# Patient Record
Sex: Male | Born: 1965
Health system: Southern US, Community
[De-identification: ages and names within clinical notes are randomized; demographics above are authoritative.]

## PROBLEM LIST (undated history)

## (undated) DIAGNOSIS — I10 Essential (primary) hypertension: Secondary | ICD-10-CM

## (undated) DIAGNOSIS — N400 Enlarged prostate without lower urinary tract symptoms: Secondary | ICD-10-CM

## (undated) DIAGNOSIS — F329 Major depressive disorder, single episode, unspecified: Secondary | ICD-10-CM

## (undated) DIAGNOSIS — E785 Hyperlipidemia, unspecified: Secondary | ICD-10-CM

## (undated) DIAGNOSIS — F32A Depression, unspecified: Secondary | ICD-10-CM

## (undated) HISTORY — DX: Major depressive disorder, single episode, unspecified: F32.9

## (undated) HISTORY — PX: NO PAST SURGERIES: SHX2092

## (undated) HISTORY — DX: Depression, unspecified: F32.A

## (undated) HISTORY — PX: CYST REMOVAL NECK: SHX6281

## (undated) HISTORY — PX: COSMETIC SURGERY: SHX468

---

## 1998-04-02 ENCOUNTER — Ambulatory Visit (HOSPITAL_COMMUNITY): Admission: RE | Admit: 1998-04-02 | Discharge: 1998-04-02 | Payer: Self-pay | Admitting: Internal Medicine

## 1999-04-15 ENCOUNTER — Ambulatory Visit (HOSPITAL_COMMUNITY): Admission: RE | Admit: 1999-04-15 | Discharge: 1999-04-15 | Payer: Self-pay | Admitting: Internal Medicine

## 1999-04-15 ENCOUNTER — Encounter: Payer: Self-pay | Admitting: Internal Medicine

## 2000-07-06 ENCOUNTER — Ambulatory Visit (HOSPITAL_COMMUNITY): Admission: RE | Admit: 2000-07-06 | Discharge: 2000-07-06 | Payer: Self-pay | Admitting: Internal Medicine

## 2000-07-06 ENCOUNTER — Encounter: Payer: Self-pay | Admitting: Internal Medicine

## 2000-09-21 ENCOUNTER — Emergency Department (HOSPITAL_COMMUNITY): Admission: EM | Admit: 2000-09-21 | Discharge: 2000-09-22 | Payer: Self-pay | Admitting: Emergency Medicine

## 2001-06-04 ENCOUNTER — Ambulatory Visit (HOSPITAL_BASED_OUTPATIENT_CLINIC_OR_DEPARTMENT_OTHER): Admission: RE | Admit: 2001-06-04 | Discharge: 2001-06-04 | Payer: Self-pay | Admitting: Surgery

## 2001-06-04 ENCOUNTER — Encounter (INDEPENDENT_AMBULATORY_CARE_PROVIDER_SITE_OTHER): Payer: Self-pay | Admitting: *Deleted

## 2001-09-19 ENCOUNTER — Ambulatory Visit (HOSPITAL_COMMUNITY): Admission: RE | Admit: 2001-09-19 | Discharge: 2001-09-19 | Payer: Self-pay | Admitting: Internal Medicine

## 2001-09-19 ENCOUNTER — Encounter: Payer: Self-pay | Admitting: Internal Medicine

## 2001-12-17 ENCOUNTER — Emergency Department (HOSPITAL_COMMUNITY): Admission: EM | Admit: 2001-12-17 | Discharge: 2001-12-17 | Payer: Self-pay | Admitting: Emergency Medicine

## 2005-12-09 ENCOUNTER — Ambulatory Visit (HOSPITAL_COMMUNITY): Admission: RE | Admit: 2005-12-09 | Discharge: 2005-12-09 | Payer: Self-pay | Admitting: Internal Medicine

## 2008-02-02 ENCOUNTER — Emergency Department (HOSPITAL_COMMUNITY): Admission: EM | Admit: 2008-02-02 | Discharge: 2008-02-02 | Payer: Self-pay | Admitting: Emergency Medicine

## 2009-09-24 ENCOUNTER — Emergency Department (HOSPITAL_COMMUNITY): Admission: EM | Admit: 2009-09-24 | Discharge: 2009-09-24 | Payer: Self-pay | Admitting: Emergency Medicine

## 2009-10-13 ENCOUNTER — Encounter: Admission: RE | Admit: 2009-10-13 | Discharge: 2009-10-13 | Payer: Self-pay | Admitting: Internal Medicine

## 2011-01-18 LAB — GLUCOSE, CAPILLARY: Glucose-Capillary: 109 — ABNORMAL HIGH

## 2011-12-27 ENCOUNTER — Other Ambulatory Visit: Payer: Self-pay | Admitting: Occupational Medicine

## 2011-12-27 ENCOUNTER — Ambulatory Visit (HOSPITAL_BASED_OUTPATIENT_CLINIC_OR_DEPARTMENT_OTHER)
Admission: RE | Admit: 2011-12-27 | Discharge: 2011-12-27 | Disposition: A | Discharge status: Home / Self Care | Payer: Managed Care, Other (non HMO) | Source: Ambulatory Visit | Attending: Occupational Medicine | Admitting: Occupational Medicine

## 2011-12-27 DIAGNOSIS — Z0000 Encounter for general adult medical examination without abnormal findings: Secondary | ICD-10-CM

## 2012-02-20 ENCOUNTER — Ambulatory Visit: Payer: Self-pay | Admitting: Cardiovascular Disease

## 2012-02-23 ENCOUNTER — Other Ambulatory Visit: Payer: Self-pay | Admitting: Urology

## 2012-04-05 ENCOUNTER — Encounter (HOSPITAL_BASED_OUTPATIENT_CLINIC_OR_DEPARTMENT_OTHER): Payer: Self-pay | Admitting: *Deleted

## 2012-04-05 NOTE — Progress Notes (Signed)
Pt instructed npo p mn x atenolol, pravastatin w sip of water.  To [facility] 12/20 @ 0615.  Needs istat on arrival.  Ekg, current list of meds, stress test reults requested from Dr. Kathryne Sharper office [phone])

## 2012-04-05 NOTE — Anesthesia Preprocedure Evaluation (Addendum)
Anesthesia Evaluation  Patient identified by MRN, date of birth, ID band Patient awake    Reviewed: Allergy & Precautions, H&P , NPO status , Patient's Chart, lab work & pertinent test results, reviewed documented beta blocker date and time   Airway Mallampati: II TM Distance: >3 FB Neck ROM: full    Dental No notable dental hx.    Pulmonary neg pulmonary ROS,  breath sounds clear to auscultation  Pulmonary exam normal       Cardiovascular Exercise Tolerance: Good hypertension, Pt. on home beta blockers and Pt. on medications negative cardio ROS  Rhythm:regular Rate:Normal     Neuro/Psych negative neurological ROS  negative psych ROS   GI/Hepatic negative GI ROS, Neg liver ROS,   Endo/Other  negative endocrine ROS  Renal/GU negative Renal ROS  negative genitourinary   Musculoskeletal negative musculoskeletal ROS (+)   Abdominal (+) + obese,   Peds negative pediatric ROS (+)  Hematology negative hematology ROS (+)   Anesthesia Other Findings   Reproductive/Obstetrics negative OB ROS                          Anesthesia Physical Anesthesia Plan  ASA: II  Anesthesia Plan: General   Post-op Pain Management:    Induction: Intravenous  Airway Management Planned: LMA  Additional Equipment:   Intra-op Plan:   Post-operative Plan: Extubation in OR  Informed Consent: I have reviewed the patients History and Physical, chart, labs and discussed the procedure including the risks, benefits and alternatives for the proposed anesthesia with the patient or authorized representative who has indicated his/her understanding and acceptance.   Dental advisory given  Plan Discussed with: CRNA  Anesthesia Plan Comments:         Anesthesia Quick Evaluation

## 2012-04-06 ENCOUNTER — Encounter (HOSPITAL_BASED_OUTPATIENT_CLINIC_OR_DEPARTMENT_OTHER): Payer: Self-pay | Admitting: *Deleted

## 2012-04-06 ENCOUNTER — Encounter (HOSPITAL_BASED_OUTPATIENT_CLINIC_OR_DEPARTMENT_OTHER): Payer: Self-pay | Admitting: Anesthesiology

## 2012-04-06 ENCOUNTER — Ambulatory Visit (HOSPITAL_BASED_OUTPATIENT_CLINIC_OR_DEPARTMENT_OTHER)
Admission: RE | Admit: 2012-04-06 | Discharge: 2012-04-06 | Disposition: A | Discharge status: Home / Self Care | Payer: Managed Care, Other (non HMO) | Source: Ambulatory Visit | Attending: Urology | Admitting: Urology

## 2012-04-06 ENCOUNTER — Encounter (HOSPITAL_BASED_OUTPATIENT_CLINIC_OR_DEPARTMENT_OTHER)
Admission: RE | Disposition: A | Discharge status: Home / Self Care | Payer: Self-pay | Source: Ambulatory Visit | Attending: Urology

## 2012-04-06 ENCOUNTER — Ambulatory Visit (HOSPITAL_BASED_OUTPATIENT_CLINIC_OR_DEPARTMENT_OTHER): Payer: Managed Care, Other (non HMO) | Admitting: Anesthesiology

## 2012-04-06 ENCOUNTER — Other Ambulatory Visit: Payer: Self-pay

## 2012-04-06 DIAGNOSIS — I10 Essential (primary) hypertension: Secondary | ICD-10-CM | POA: Insufficient documentation

## 2012-04-06 DIAGNOSIS — Z302 Encounter for sterilization: Secondary | ICD-10-CM | POA: Insufficient documentation

## 2012-04-06 DIAGNOSIS — E785 Hyperlipidemia, unspecified: Secondary | ICD-10-CM | POA: Insufficient documentation

## 2012-04-06 HISTORY — PX: VASECTOMY: SHX75

## 2012-04-06 HISTORY — DX: Hyperlipidemia, unspecified: E78.5

## 2012-04-06 HISTORY — DX: Essential (primary) hypertension: I10

## 2012-04-06 LAB — POCT I-STAT 4, (NA,K, GLUC, HGB,HCT)
Glucose, Bld: 118 mg/dL — ABNORMAL HIGH (ref 70–99)
HCT: 43 % (ref 39.0–52.0)
Hemoglobin: 14.6 g/dL (ref 13.0–17.0)
Potassium: 4.2 mEq/L (ref 3.5–5.1)
Sodium: 141 mEq/L (ref 135–145)

## 2012-04-06 SURGERY — VASECTOMY
Anesthesia: General | Site: Scrotum | Laterality: Bilateral | Wound class: Clean

## 2012-04-06 MED ORDER — EPHEDRINE SULFATE 50 MG/ML IJ SOLN
INTRAMUSCULAR | Status: DC | PRN
  Administered 2012-04-06: 10 mg via INTRAVENOUS

## 2012-04-06 MED ORDER — 0.9 % SODIUM CHLORIDE (POUR BTL) OPTIME
TOPICAL | Status: DC | PRN
  Administered 2012-04-06: 500 mL

## 2012-04-06 MED ORDER — ONDANSETRON HCL 4 MG/2ML IJ SOLN
INTRAMUSCULAR | Status: DC | PRN
  Administered 2012-04-06: 4 mg via INTRAVENOUS

## 2012-04-06 MED ORDER — PROMETHAZINE HCL 25 MG/ML IJ SOLN
6.2500 mg | INTRAMUSCULAR | Status: DC | PRN
  Filled 2012-04-06: qty 1

## 2012-04-06 MED ORDER — MIDAZOLAM HCL 5 MG/5ML IJ SOLN
INTRAMUSCULAR | Status: DC | PRN
  Administered 2012-04-06 (×2): 1 mg via INTRAVENOUS

## 2012-04-06 MED ORDER — LACTATED RINGERS IV SOLN
INTRAVENOUS | Status: DC
  Administered 2012-04-06 (×2): via INTRAVENOUS
  Filled 2012-04-06: qty 1000

## 2012-04-06 MED ORDER — SENNA-DOCUSATE SODIUM 8.6-50 MG PO TABS: 1.0000 | ORAL_TABLET | Freq: Every day | ORAL | Status: DC

## 2012-04-06 MED ORDER — CEFAZOLIN SODIUM-DEXTROSE 2-3 GM-% IV SOLR
2.0000 g | INTRAVENOUS | Status: DC
  Filled 2012-04-06: qty 50

## 2012-04-06 MED ORDER — FENTANYL CITRATE 0.05 MG/ML IJ SOLN
INTRAMUSCULAR | Status: DC | PRN
  Administered 2012-04-06 (×2): 50 ug via INTRAVENOUS

## 2012-04-06 MED ORDER — LIDOCAINE HCL (CARDIAC) 20 MG/ML IV SOLN
INTRAVENOUS | Status: DC | PRN
  Administered 2012-04-06: 75 mg via INTRAVENOUS

## 2012-04-06 MED ORDER — BUPIVACAINE HCL (PF) 0.25 % IJ SOLN
INTRAMUSCULAR | Status: DC | PRN
  Administered 2012-04-06: 3 mL

## 2012-04-06 MED ORDER — PROPOFOL 10 MG/ML IV BOLUS
INTRAVENOUS | Status: DC | PRN
  Administered 2012-04-06: 280 mg via INTRAVENOUS

## 2012-04-06 MED ORDER — DEXAMETHASONE SODIUM PHOSPHATE 4 MG/ML IJ SOLN
INTRAMUSCULAR | Status: DC | PRN
  Administered 2012-04-06: 10 mg via INTRAVENOUS

## 2012-04-06 MED ORDER — TRAMADOL HCL 50 MG PO TABS: 50.0000 mg | ORAL_TABLET | Freq: Four times a day (QID) | ORAL | Status: DC | PRN

## 2012-04-06 MED ORDER — DEXTROSE 5 % IV SOLN
3.0000 g | INTRAVENOUS | Status: DC | PRN
  Administered 2012-04-06: 3 g via INTRAVENOUS

## 2012-04-06 MED ORDER — FENTANYL CITRATE 0.05 MG/ML IJ SOLN
25.0000 ug | INTRAMUSCULAR | Status: DC | PRN
  Filled 2012-04-06: qty 1

## 2012-04-06 SURGICAL SUPPLY — 26 items
BANDAGE GAUZE ELAST BULKY 4 IN (GAUZE/BANDAGES/DRESSINGS) ×2 IMPLANT
BLADE SURG 15 STRL LF DISP TIS (BLADE) ×1 IMPLANT
BLADE SURG 15 STRL SS (BLADE) ×1
BLADE SURG ROTATE 9660 (MISCELLANEOUS) ×2 IMPLANT
CLOTH BEACON ORANGE TIMEOUT ST (SAFETY) ×2 IMPLANT
COVER MAYO STAND STRL (DRAPES) ×2 IMPLANT
COVER TABLE BACK 60X90 (DRAPES) ×2 IMPLANT
DRAPE PED LAPAROTOMY (DRAPES) ×2 IMPLANT
ELECT NEEDLE TIP 2.8 STRL (NEEDLE) IMPLANT
ELECT REM PT RETURN 9FT ADLT (ELECTROSURGICAL) ×2
ELECTRODE REM PT RTRN 9FT ADLT (ELECTROSURGICAL) ×1 IMPLANT
GLOVE BIO SURGEON STRL SZ7.5 (GLOVE) ×2 IMPLANT
GOWN W/2 COTTON TOWELS 2 STD (GOWNS) ×2 IMPLANT
NEEDLE HYPO 25X1 1.5 SAFETY (NEEDLE) ×2 IMPLANT
NEEDLE HYPO 25X5/8 SAFETYGLIDE (NEEDLE) IMPLANT
NS IRRIG 500ML POUR BTL (IV SOLUTION) ×2 IMPLANT
PACK BASIN DAY SURGERY FS (CUSTOM PROCEDURE TRAY) ×2 IMPLANT
PENCIL BUTTON HOLSTER BLD 10FT (ELECTRODE) ×2 IMPLANT
SUPPORT SCROTAL LG STRP (MISCELLANEOUS) ×2 IMPLANT
SUT CHROMIC 3 0 PS 2 (SUTURE) ×2 IMPLANT
SUT SILK 2 0 (SUTURE) ×1
SUT SILK 2-0 18XBRD TIE 12 (SUTURE) ×1 IMPLANT
SYR CONTROL 10ML LL (SYRINGE) ×2 IMPLANT
TOWEL OR 17X24 6PK STRL BLUE (TOWEL DISPOSABLE) ×2 IMPLANT
TRAY DSU PREP LF (CUSTOM PROCEDURE TRAY) ×2 IMPLANT
WATER STERILE IRR 500ML POUR (IV SOLUTION) IMPLANT

## 2012-04-06 NOTE — Brief Op Note (Signed)
04/06/2012  8:01 AM  PATIENT:  Nicholas Crosby  46 y.o. male  PRE-OPERATIVE DIAGNOSIS:  DESIRE FOR STERILIZATION  POST-OPERATIVE DIAGNOSIS:  desire sterilization  PROCEDURE:  Procedure(s) (LRB) with comments: VASECTOMY (Bilateral)  SURGEON:  Surgeon(s) and Role:    * Sebastian Ache, MD - Primary  PHYSICIAN ASSISTANT:   ASSISTANTS: none   ANESTHESIA:   local and general  EBL:     BLOOD ADMINISTERED:none  DRAINS: none   LOCAL MEDICATIONS USED:  MARCAINE     SPECIMEN:  Source of Specimen:  Bilateral vas segments  DISPOSITION OF SPECIMEN:  inspected and discarded  COUNTS:  YES  TOURNIQUET:  * No tourniquets in log *  DICTATION: .Other Dictation: Dictation Number [id number]  PLAN OF CARE: Discharge to home after PACU  PATIENT DISPOSITION:  PACU - hemodynamically stable.   Delay start of Pharmacological VTE agent (>24hrs) due to surgical blood loss or risk of bleeding: no

## 2012-04-06 NOTE — H&P (Signed)
Nicholas Crosby is an 46 y.o. male.   Chief Complaint: Pre-Op Vasectomy HPI:    1- Desire for Male Sterilization - PT is a 46yo man with two  healthy children ages 73 and 78.Marland Kitchen He is divorced and not in a relatio nship but has been considering vasectomy for some time.   On exam he has very unfavorable anatomy for office vasectomy. He is very stocky with "high-riding" testicles that makes manipulation of vas very difficult.   PMH sig for hypogonadism, obesity, HLD, HTN. No CV disease. No blood thinners.  Today Nicholas Crosby presents for operative vasectomy   Past Medical History  Diagnosis Date  . Hypertension   . Hyperlipidemia     Past Surgical History  Procedure Date  . No past surgeries     History reviewed. No pertinent family history. Social History:  reports that he has never smoked. He does not have any smokeless tobacco history on file. He reports that he drinks about 1.2 ounces of alcohol per week. His drug history not on file.  Allergies: No Known Allergies  No prescriptions prior to admission    No results found for this or any previous visit (from the past 48 hour(s)). No results found.  Review of Systems  Constitutional: Negative.   HENT: Negative.   Eyes: Negative.   Respiratory: Negative.   Cardiovascular: Negative.   Gastrointestinal: Negative.   Genitourinary: Negative.   Musculoskeletal: Negative.   Skin: Negative.   Neurological: Negative.   Endo/Heme/Allergies: Negative.   Psychiatric/Behavioral: Negative.     Height 5\' 11"  (1.803 m), weight 124.739 kg (275 lb). Physical Exam  Constitutional: He is oriented to person, place, and time. He appears well-developed and well-nourished.       Very stocky  HENT:  Head: Normocephalic and atraumatic.  Eyes: EOM are normal. Pupils are equal, round, and reactive to light.  Neck: Normal range of motion. Neck supple.  Cardiovascular: Normal rate.   Respiratory: Effort normal.  GI: Soft. Bowel sounds are  normal.  Genitourinary: Penis normal.       Testicles very high in scrotum, vas barely palpable  Musculoskeletal: Normal range of motion.  Neurological: He is alert and oriented to person, place, and time.  Skin: Skin is warm and dry.  Psychiatric: He has a normal mood and affect. His behavior is normal. Judgment and thought content normal.     Assessment/Plan  1- Desire for Male Sterilization - We re-discussed the risks (pain, hematoma) and benefits (permanent sterility) of vasectomy. I reinforced that although vas reversals are possible, he should consider the procedure permanent. I outlined the procedure and peri-procedural course with need to bring a driver and have limited activity x2-3 days afterwards.  I also reinforced that he should not engage in intercourse without other contraceptives until specifically cleared by me after two negative semenanalysis. I did outline alternative methods of contraception (condom, OCP, injections, tubal ligation). The patient voiced understanding and desired to proceed with vasectomy.   In his case I have couseled him towards procedure in OR as his anatomy is NOT FAVORABLE for office procedure due to his stocky habitus, short scrotum and prominant pre-pubic fat pad. He wants to proceed.  Nicholas Crosby, Nicholas Crosby 04/06/2012, 6:04 AM

## 2012-04-06 NOTE — Op Note (Signed)
NAME:  Nicholas Crosby, Nicholas Crosby               ACCOUNT NO.:  [id number]  MEDICAL RECORD NO.:  [id number]  LOCATION:  XRAY                          FACILITY:  [facility]  PHYSICIAN:  [name] [name], MD     DATE OF BIRTH:  02/23/1966  DATE OF PROCEDURE: DATE OF DISCHARGE:  12/27/2011                              OPERATIVE REPORT   DIAGNOSIS:  Desire for male sterilization.  PROCEDURE:  Bilateral vasectomy.  ASSESSMENT:  Bilateral vas segments inspected and discarded.  COMPLICATIONS:  None.  FINDINGS:  Very high-riding testicles, predominant prepubic fat pad.  COMPLICATIONS:  None.  INDICATION:  Nicholas Crosby is a pleasant 46-year-old gentleman, who has 2 children and desires no future children.  He has thought about this for greater than 6 months.  He was evaluated for vasectomy.  Office exam revealed that he had a very scant amount of relative scrotal tissue and high-riding testicles.  He is very stocky gentleman with a very prominent prepubic fat pad and it is hoped that vasectomy in the OR would be a safer avenue versus an office procedure and he wished to proceed.  Informed consent was obtained and placed in medical record.  PROCEDURE IN DETAIL:  The patient being Nicholas Crosby, was verified. Procedure being bilateral vasectomies confirmed.  Procedure was carried out.  Time-out was performed. Intravenous antibiotics administered. General LMA anesthesia was introduced.  The patient was placed in supine position.  Sterile field was created by prepping the patient's penis, scrotum and proximal thigh using iodine x3.  Next, the left testicle was placed on gentle inferior traction and the left deferens was palpated and brought in direct apposition to the lateral skin.  Sharp forceps were used to pierce the skin and is spread for distance of 1 cm.  Vas deferens grasped with ring forceps and brought up through the incision. It was further isolated by spreading the vas sheath and dissected for total  distance of approximately 3 cm.  It was doubly clamped.  A 1 cm intervening segment was cut and set aside.  This was inspected and felt to be vas deferens and set aside for discard.  The lumen of the free ends were coagulated in each and was also tied using a 3-0 chromic x1. Hemostasis appeared excellent.  This was redelivered into left hemiscrotum.  A single U stitch was placed at the level of the skin.  A mirror image procedure was then performed on the right side performing right vasectomy in an exact same fashion again coagulating the free ends, inspecting the vas segment, tying the free ends, and placing a single stitch on the skin surface, 3 mL of Marcaine was delivered locally.  Procedure was then terminated.  The patient tolerated the procedure well.  There were no immediate periprocedural complications.  The patient was taken to postanesthesia care unit in stable condition.          ______________________________ [name] [name], MD     TM/MEDQ  D:  04/06/2012  T:  04/06/2012  Job:  [id number] 

## 2012-04-06 NOTE — Op Note (Deleted)
NAME:  Nicholas Crosby, Nicholas Crosby NO.:  [id number]  MEDICAL RECORD NO.:  [id number]  LOCATION:  XRAY                          FACILITY:  [facility]  PHYSICIAN:  Sebastian Ache, MD     DATE OF BIRTH:  1965/05/29  DATE OF PROCEDURE: DATE OF DISCHARGE:  12/27/2011                              OPERATIVE REPORT   DIAGNOSIS:  Desire for male sterilization.  PROCEDURE:  Bilateral vasectomy.  ASSESSMENT:  Bilateral vas segments inspected and discarded.  COMPLICATIONS:  None.  FINDINGS:  Very high-riding testicles, predominant prepubic fat pad.  COMPLICATIONS:  None.  INDICATION:  [name] is a pleasant 46 year old gentleman, who has 2 children and desires no future children.  He has thought about this for greater than 6 months.  He was evaluated for vasectomy.  Office exam revealed that he had a very scant amount of relative scrotal tissue and high-riding testicles.  He is very stocky gentleman with a very prominent prepubic fat pad and it is hoped that vasectomy in the OR would be a safer avenue versus an office procedure and he wished to proceed.  Informed consent was obtained and placed in medical record.  PROCEDURE IN DETAIL:  The patient being brandun pinn, was verified. Procedure being bilateral vasectomies confirmed.  Procedure was carried out.  Time-out was performed. Intravenous antibiotics administered. General LMA anesthesia was introduced.  The patient was placed in supine position.  Sterile field was created by prepping the patient's penis, scrotum and proximal thigh using iodine x3.  Next, the left testicle was placed on gentle inferior traction and the left deferens was palpated and brought in direct apposition to the lateral skin.  Sharp forceps were used to pierce the skin and is spread for distance of 1 cm.  Vas deferens grasped with ring forceps and brought up through the incision. It was further isolated by spreading the vas sheath and dissected for total  distance of approximately 3 cm.  It was doubly clamped.  A 1 cm intervening segment was cut and set aside.  This was inspected and felt to be vas deferens and set aside for discard.  The lumen of the free ends were coagulated in each and was also tied using a 3-0 chromic x1. Hemostasis appeared excellent.  This was redelivered into left hemiscrotum.  A single U stitch was placed at the level of the skin.  A mirror image procedure was then performed on the right side performing right vasectomy in an exact same fashion again coagulating the free ends, inspecting the vas segment, tying the free ends, and placing a single stitch on the skin surface, 3 mL of Marcaine was delivered locally.  Procedure was then terminated.  The patient tolerated the procedure well.  There were no immediate periprocedural complications.  The patient was taken to postanesthesia care unit in stable condition.          ______________________________ Sebastian Ache, MD     TM/MEDQ  D:  04/06/2012  T:  04/06/2012  Job:  [id number]

## 2012-04-06 NOTE — Discharge Instructions (Addendum)
1 -  Call MD or go to ER for fever >102, severe pain / nausea / vomiting not relieved by medications, or acute change in medical status  2 - Continue scrotal support / snug undergarments x 48 hours along with minimal strenuous activity.  Doesn't need to stay to void before discharge

## 2012-04-06 NOTE — Anesthesia Procedure Notes (Signed)
Procedure Name: LMA Insertion Date/Time: 04/06/2012 7:35 AM Performed by: Fran Lowes Pre-anesthesia Checklist: Patient identified, Emergency Drugs available, Suction available and Patient being monitored Patient Re-evaluated:Patient Re-evaluated prior to inductionOxygen Delivery Method: Circle System Utilized Preoxygenation: Pre-oxygenation with 100% oxygen Intubation Type: IV induction Ventilation: Mask ventilation without difficulty LMA: LMA inserted LMA Size: 4.0 Number of attempts: 1 Airway Equipment and Method: bite block Placement Confirmation: positive ETCO2 Tube secured with: Tape Dental Injury: Teeth and Oropharynx as per pre-operative assessment

## 2012-04-06 NOTE — Anesthesia Postprocedure Evaluation (Signed)
  Anesthesia Post-op Note  Patient: Nicholas Crosby  Procedure(s) Performed: Procedure(s) (LRB): VASECTOMY (Bilateral)  Patient Location: PACU  Anesthesia Type: General  Level of Consciousness: awake and alert   Airway and Oxygen Therapy: Patient Spontanous Breathing  Post-op Pain: mild  Post-op Assessment: Post-op Vital signs reviewed, Patient's Cardiovascular Status Stable, Respiratory Function Stable, Patent Airway and No signs of Nausea or vomiting  Last Vitals:  Filed Vitals:   04/06/12 0845  BP:   Pulse: 59  Temp:   Resp: 20    Post-op Vital Signs: stable   Complications: No apparent anesthesia complications

## 2012-04-06 NOTE — Transfer of Care (Signed)
Immediate Anesthesia Transfer of Care Note  Patient: Nicholas Crosby  Procedure(s) Performed: Procedure(s) (LRB): VASECTOMY (Bilateral)  Patient Location: Patient transported to PACU with oxygen via face mask at 4 Liters / Min  Anesthesia Type: General  Level of Consciousness: awake and alert   Airway & Oxygen Therapy: Patient Spontanous Breathing and Patient connected to face mask oxygen  Post-op Assessment: Report given to PACU RN and Post -op Vital signs reviewed and stable  Post vital signs: Reviewed and stable  Dentition: Teeth and oropharynx remain in pre-op condition  Complications: No apparent anesthesia complications

## 2012-04-09 ENCOUNTER — Encounter (HOSPITAL_BASED_OUTPATIENT_CLINIC_OR_DEPARTMENT_OTHER): Payer: Self-pay | Admitting: Urology

## 2012-05-24 ENCOUNTER — Emergency Department (HOSPITAL_BASED_OUTPATIENT_CLINIC_OR_DEPARTMENT_OTHER)
Admission: EM | Admit: 2012-05-24 | Discharge: 2012-05-24 | Disposition: A | Discharge status: Home / Self Care | Payer: Managed Care, Other (non HMO) | Attending: Emergency Medicine | Admitting: Emergency Medicine

## 2012-05-24 ENCOUNTER — Encounter (HOSPITAL_BASED_OUTPATIENT_CLINIC_OR_DEPARTMENT_OTHER): Payer: Self-pay | Admitting: Emergency Medicine

## 2012-05-24 DIAGNOSIS — I10 Essential (primary) hypertension: Secondary | ICD-10-CM | POA: Insufficient documentation

## 2012-05-24 DIAGNOSIS — Z79899 Other long term (current) drug therapy: Secondary | ICD-10-CM | POA: Insufficient documentation

## 2012-05-24 DIAGNOSIS — R6883 Chills (without fever): Secondary | ICD-10-CM | POA: Insufficient documentation

## 2012-05-24 DIAGNOSIS — R197 Diarrhea, unspecified: Secondary | ICD-10-CM | POA: Insufficient documentation

## 2012-05-24 DIAGNOSIS — E785 Hyperlipidemia, unspecified: Secondary | ICD-10-CM | POA: Insufficient documentation

## 2012-05-24 DIAGNOSIS — R110 Nausea: Secondary | ICD-10-CM | POA: Insufficient documentation

## 2012-05-24 MED ORDER — METRONIDAZOLE 500 MG PO TABS: 500.0000 mg | ORAL_TABLET | Freq: Three times a day (TID) | ORAL | Status: DC

## 2012-05-24 MED ORDER — ONDANSETRON HCL 8 MG PO TABS: 8.0000 mg | ORAL_TABLET | ORAL | Status: DC | PRN

## 2012-05-24 NOTE — ED Notes (Signed)
Pt has not contacted PMD for this nor has he tried any OTC medications.

## 2012-05-24 NOTE — ED Notes (Signed)
Pt c/o diarrhea x 3 days. Pt states mild nausea that started this AM. Pt states his father is at [facility] with C diff.

## 2012-05-24 NOTE — ED Provider Notes (Signed)
History     CSN: [id number]  Arrival date & time 05/24/12  0526   First MD Initiated Contact with Patient 05/24/12 0536      Chief Complaint  Patient presents with  . Diarrhea  . Nausea    (Consider location/radiation/quality/duration/timing/severity/associated sxs/prior treatment) HPI Comments: Patient presents with a two day history of diarrhea.  His father was diagnosed with c-diff and he has been around him quite a bit.  All stool non-bloody.  He denies fevers, chills, or abd pain.    Patient is a 47 y.o. male presenting with diarrhea. The history is provided by the patient.  Diarrhea The primary symptoms include nausea and diarrhea. Primary symptoms do not include vomiting. The illness began 2 days ago. The onset was gradual. The problem has been gradually worsening.  The illness is also significant for chills.    Past Medical History  Diagnosis Date  . Hypertension   . Hyperlipidemia     Past Surgical History  Procedure Date  . No past surgeries   . Vasectomy 04/06/2012    Procedure: VASECTOMY;  Surgeon: Sebastian Ache, MD;  Location: [facility];  Service: Urology;  Laterality: Bilateral;    No family history on file.  History  Substance Use Topics  . Smoking status: Never Smoker   . Smokeless tobacco: Not on file  . Alcohol Use: 1.2 oz/week    2 Cans of beer per week      Review of Systems  Constitutional: Positive for chills.  Gastrointestinal: Positive for nausea and diarrhea. Negative for vomiting.  All other systems reviewed and are negative.    Allergies  Review of patient's allergies indicates no known allergies.  Home Medications   Current Outpatient Rx  Name  Route  Sig  Dispense  Refill  . ATENOLOL 100 MG PO TABS   Oral   Take 50 mg by mouth daily.         . BUPROPION HCL ER (XL) 300 MG PO TB24   Oral   Take 300 mg by mouth daily.         . ENALAPRIL MALEATE 10 MG PO TABS   Oral   Take 10 mg by mouth daily.        Marland Kitchen HYDROCHLOROTHIAZIDE 25 MG PO TABS   Oral   Take 25 mg by mouth daily.         Marland Kitchen PRAVASTATIN SODIUM 20 MG PO TABS   Oral   Take 20 mg by mouth daily.         Marland Kitchen VERAPAMIL HCL 120 MG PO TABS   Oral   Take 120 mg by mouth 3 (three) times daily.         . SENNA-DOCUSATE SODIUM 8.6-50 MG PO TABS   Oral   Take 1 tablet by mouth daily. While taking pain meds to prevent constipation   30 tablet   0   . TRAMADOL HCL 50 MG PO TABS   Oral   Take 1 tablet (50 mg total) by mouth every 6 (six) hours as needed for pain.   30 tablet   0     BP 169/102  Pulse 89  Temp 98.3 F (36.8 C) (Oral)  Resp 18  Ht 5\' 11"  (1.803 m)  Wt 270 lb (122.471 kg)  BMI 37.66 kg/m2  SpO2 98%  Physical Exam  Nursing note and vitals reviewed. Constitutional: He is oriented to person, place, and time. He appears well-developed and well-nourished. No distress.  HENT:  Head: Normocephalic and atraumatic.  Mouth/Throat: Oropharynx is clear and moist.  Neck: Normal range of motion. Neck supple.  Cardiovascular: Normal rate and regular rhythm.   No murmur heard. Pulmonary/Chest: Effort normal and breath sounds normal. No respiratory distress.  Abdominal: Soft. Bowel sounds are normal. He exhibits no distension. There is no tenderness.  Musculoskeletal: Normal range of motion. He exhibits no edema.  Neurological: He is alert and oriented to person, place, and time.  Skin: Skin is warm and dry. He is not diaphoretic.    ED Course  Procedures (including critical care time)  Labs Reviewed - No data to display No results found.   No diagnosis found.    MDM  Symptoms sound viral in nature, however he has been exposed to someone with C-Diff.  Will recommend otc anti-diarrheals, time.  If not improving or worsens in the next 1-2 days, will give Rx for flagyl for presumed C-Diff.        Geoffery Lyons, MD 05/24/12 [phone]

## 2012-12-14 LAB — FECAL OCCULT BLOOD, GUAIAC: Fecal Occult Blood: NEGATIVE

## 2013-03-05 ENCOUNTER — Encounter: Payer: Self-pay | Admitting: Internal Medicine

## 2013-03-05 DIAGNOSIS — E559 Vitamin D deficiency, unspecified: Secondary | ICD-10-CM | POA: Insufficient documentation

## 2013-03-05 DIAGNOSIS — E349 Endocrine disorder, unspecified: Secondary | ICD-10-CM | POA: Insufficient documentation

## 2013-03-05 DIAGNOSIS — F32A Depression, unspecified: Secondary | ICD-10-CM

## 2013-03-05 DIAGNOSIS — E291 Testicular hypofunction: Secondary | ICD-10-CM

## 2013-03-05 DIAGNOSIS — E785 Hyperlipidemia, unspecified: Secondary | ICD-10-CM | POA: Insufficient documentation

## 2013-03-05 DIAGNOSIS — I10 Essential (primary) hypertension: Secondary | ICD-10-CM

## 2013-03-05 DIAGNOSIS — F329 Major depressive disorder, single episode, unspecified: Secondary | ICD-10-CM

## 2013-03-05 DIAGNOSIS — F325 Major depressive disorder, single episode, in full remission: Secondary | ICD-10-CM | POA: Insufficient documentation

## 2013-03-06 ENCOUNTER — Encounter: Payer: Self-pay | Admitting: Emergency Medicine

## 2013-03-06 ENCOUNTER — Ambulatory Visit: Payer: Managed Care, Other (non HMO) | Admitting: Emergency Medicine

## 2013-03-06 VITALS — BP 132/80 | HR 60 | Temp 98.0°F | Resp 18 | Wt 284.0 lb

## 2013-03-06 DIAGNOSIS — I10 Essential (primary) hypertension: Secondary | ICD-10-CM

## 2013-03-06 DIAGNOSIS — R7309 Other abnormal glucose: Secondary | ICD-10-CM

## 2013-03-06 DIAGNOSIS — E559 Vitamin D deficiency, unspecified: Secondary | ICD-10-CM

## 2013-03-06 DIAGNOSIS — E782 Mixed hyperlipidemia: Secondary | ICD-10-CM

## 2013-03-06 DIAGNOSIS — E291 Testicular hypofunction: Secondary | ICD-10-CM

## 2013-03-06 LAB — BASIC METABOLIC PANEL WITH GFR
BUN: 12 mg/dL (ref 6–23)
CO2: 30 mEq/L (ref 19–32)
Calcium: 9.4 mg/dL (ref 8.4–10.5)
Chloride: 103 mEq/L (ref 96–112)
Creat: 1 mg/dL (ref 0.50–1.35)
GFR, Est African American: >89 mL/min
GFR, Est Non African American: >89 mL/min
Glucose, Bld: 113 mg/dL — ABNORMAL HIGH (ref 70–99)
Potassium: 4.4 mEq/L (ref 3.5–5.3)
Sodium: 141 mEq/L (ref 135–145)

## 2013-03-06 LAB — CBC WITH DIFFERENTIAL/PLATELET
Basophils Absolute: 0 10*3/uL (ref 0.0–0.1)
Basophils Relative: 1 % (ref 0–1)
Eosinophils Absolute: 0.2 10*3/uL (ref 0.0–0.7)
Eosinophils Relative: 3 % (ref 0–5)
HCT: 39.5 % (ref 39.0–52.0)
Hemoglobin: 13.8 g/dL (ref 13.0–17.0)
Lymphocytes Relative: 28 % (ref 12–46)
Lymphs Abs: 2.1 10*3/uL (ref 0.7–4.0)
MCH: 28.6 pg (ref 26.0–34.0)
MCHC: 34.9 g/dL (ref 30.0–36.0)
MCV: 81.8 fL (ref 78.0–100.0)
Monocytes Absolute: 0.6 10*3/uL (ref 0.1–1.0)
Monocytes Relative: 9 % (ref 3–12)
Neutro Abs: 4.6 10*3/uL (ref 1.7–7.7)
Neutrophils Relative %: 59 % (ref 43–77)
Platelets: 268 10*3/uL (ref 150–400)
RBC: 4.83 MIL/uL (ref 4.22–5.81)
RDW: 14.7 % (ref 11.5–15.5)
WBC: 7.6 10*3/uL (ref 4.0–10.5)

## 2013-03-06 LAB — LIPID PANEL
Cholesterol: 202 mg/dL — ABNORMAL HIGH (ref 0–200)
HDL: 37 mg/dL — ABNORMAL LOW (ref >39)
LDL Cholesterol: 92 mg/dL (ref 0–99)
Total CHOL/HDL Ratio: 5.5 Ratio
Triglycerides: 363 mg/dL — ABNORMAL HIGH (ref <150)
VLDL: 73 mg/dL — ABNORMAL HIGH (ref 0–40)

## 2013-03-06 LAB — HEPATIC FUNCTION PANEL
ALT: 40 U/L (ref 0–53)
AST: 27 U/L (ref 0–37)
Albumin: 4.1 g/dL (ref 3.5–5.2)
Alkaline Phosphatase: 41 U/L (ref 39–117)
Bilirubin, Direct: 0.1 mg/dL (ref 0.0–0.3)
Indirect Bilirubin: 0.2 mg/dL (ref 0.0–0.9)
Total Bilirubin: 0.3 mg/dL (ref 0.3–1.2)
Total Protein: 6.8 g/dL (ref 6.0–8.3)

## 2013-03-06 LAB — TESTOSTERONE: Testosterone: 154 ng/dL — ABNORMAL LOW (ref 300–890)

## 2013-03-06 LAB — MAGNESIUM: Magnesium: 1.9 mg/dL (ref 1.5–2.5)

## 2013-03-06 LAB — HEMOGLOBIN A1C
Hgb A1c MFr Bld: 5.9 % — ABNORMAL HIGH (ref <5.7)
Mean Plasma Glucose: 123 mg/dL — ABNORMAL HIGH (ref <117)

## 2013-03-06 NOTE — Progress Notes (Signed)
  Subjective:    Patient ID: Nicholas Crosby, male    DOB: 12-02-1965, 47 y.o.   MRN: [id number]  HPI Comments: 47 yo male presents for 3 month F/U for HTN, Cholesterol, Pre-Dm, D. Deficient, hypogonadism. Added vit D 5000 and Androgel 1.62 % at last ov and has had increased energy level. Exercising more regularly, try to lose wt. Eating better most days. BP has  been good at home. LAST LABS T 178 TG 259 HDL 39 LDL 87 A1C 5.8   Hypertension  Hyperlipidemia    Current Outpatient Prescriptions on File Prior to Visit  Medication Sig Dispense Refill  . aspirin 81 MG tablet Take 81 mg by mouth daily.      Marland Kitchen atenolol (TENORMIN) 100 MG tablet Take 50 mg by mouth daily.      Marland Kitchen buPROPion (WELLBUTRIN XL) 300 MG 24 hr tablet Take 300 mg by mouth daily.      . enalapril (VASOTEC) 10 MG tablet Take 10 mg by mouth daily.      . hydrochlorothiazide (HYDRODIURIL) 25 MG tablet Take 25 mg by mouth daily.      . pravastatin (PRAVACHOL) 20 MG tablet Take 20 mg by mouth daily.      . Testosterone (ANDROGEL) 20.25 MG/1.25GM (1.62%) GEL Place 4 Squirts onto the skin daily.      . verapamil (CALAN) 120 MG tablet Take 120 mg by mouth 3 (three) times daily.       No current facility-administered medications on file prior to visit.   ALLERGIES Zocor and Crestor  Past Medical History  Diagnosis Date  . Hypertension   . Hyperlipidemia   . Other testicular hypofunction   . Unspecified vitamin D deficiency   . Depression      Review of Systems  All other systems reviewed and are negative.    BP 132/80  Pulse 60  Temp(Src) 98 F (36.7 C) (Temporal)  Resp 18  Wt 284 lb (128.822 kg)     Objective:   Physical Exam  Nursing note and vitals reviewed. Constitutional: He is oriented to person, place, and time. He appears well-developed and well-nourished.  HENT:  Head: Normocephalic and atraumatic.  Right Ear: External ear normal.  Left Ear: External ear normal.  Nose: Nose normal.  Mouth/Throat:  Oropharynx is clear and moist.  Eyes: Pupils are equal, round, and reactive to light.  Neck: Normal range of motion. Neck supple.  Cardiovascular: Normal rate, regular rhythm, normal heart sounds and intact distal pulses.   Pulmonary/Chest: Effort normal and breath sounds normal.  Abdominal: Soft. Bowel sounds are normal. He exhibits no distension and no mass. There is no tenderness.  Musculoskeletal: Normal range of motion.  Neurological: He is alert and oriented to person, place, and time.  Skin: Skin is warm and dry.  Psychiatric: He has a normal mood and affect. Judgment and thought content normal.          Assessment & Plan:  1. 3 month F/U for HTN, Cholesterol, Pre-Dm, D. Deficient, HYPOGONADISM recheck labs, continue to increase exercise, wt loss, and improve diet. 2. Allergic rhinitis add Allegra AD

## 2013-03-06 NOTE — Patient Instructions (Signed)
Diabetes Meal Planning Guide The diabetes meal planning guide is a tool to help you plan your meals and snacks. It is important for people with diabetes to manage their blood glucose (sugar) levels. Choosing the right foods and the right amounts throughout your day will help control your blood glucose. Eating right can even help you improve your blood pressure and reach or maintain a healthy weight. CARBOHYDRATE COUNTING MADE EASY When you eat carbohydrates, they turn to sugar. This raises your blood glucose level. Counting carbohydrates can help you control this level so you feel better. When you plan your meals by counting carbohydrates, you can have more flexibility in what you eat and balance your medicine with your food intake. Carbohydrate counting simply means adding up the total amount of carbohydrate grams in your meals and snacks. Try to eat about the same amount at each meal. Foods with carbohydrates are listed below. Each portion below is 1 carbohydrate serving or 15 grams of carbohydrates. Ask your dietician how many grams of carbohydrates you should eat at each meal or snack. Grains and Starches  1 slice bread.   English muffin or hotdog/hamburger bun.   cup cold cereal (unsweetened).   cup cooked pasta or rice.   cup starchy vegetables (corn, potatoes, peas, beans, winter squash).  1 tortilla (6 inches).   bagel.  1 waffle or pancake (size of a CD).   cup cooked cereal.  4 to 6 small crackers. *Whole grain is recommended. Fruit  1 cup fresh unsweetened berries, melon, papaya, pineapple.  1 small fresh fruit.   banana or mango.   cup fruit juice (4 oz unsweetened).   cup canned fruit in natural juice or water.  2 tbs dried fruit.  12 to 15 grapes or cherries. Milk and Yogurt  1 cup fat-free or 1% milk.  1 cup soy milk.  6 oz light yogurt with sugar-free sweetener.  6 oz low-fat soy yogurt.  6 oz plain yogurt. Vegetables  1 cup raw or  cup  cooked is counted as 0 carbohydrates or a "free" food.  If you eat 3 or more servings at 1 meal, count them as 1 carbohydrate serving. Other Carbohydrates   oz chips or pretzels.   cup ice cream or frozen yogurt.   cup sherbet or sorbet.  2 inch square cake, no frosting.  1 tbs honey, sugar, jam, jelly, or syrup.  2 small cookies.  3 squares of graham crackers.  3 cups popcorn.  6 crackers.  1 cup broth-based soup.  Count 1 cup casserole or other mixed foods as 2 carbohydrate servings.  Foods with less than 20 calories in a serving may be counted as 0 carbohydrates or a "free" food. You may want to purchase a book or computer software that lists the carbohydrate gram counts of different foods. In addition, the nutrition facts panel on the labels of the foods you eat are a good source of this information. The label will tell you how big the serving size is and the total number of carbohydrate grams you will be eating per serving. Divide this number by 15 to obtain the number of carbohydrate servings in a portion. Remember, 1 carbohydrate serving equals 15 grams of carbohydrate. SERVING SIZES Measuring foods and serving sizes helps you make sure you are getting the right amount of food. The list below tells how big or small some common serving sizes are.  1 oz.........4 stacked dice.  3 oz.........Deck of cards.  1 tsp........Tip   of little finger.  1 tbs........Thumb.  2 tbs........Golf ball.   cup.......Half of a fist.  1 cup........A fist. SAMPLE DIABETES MEAL PLAN Below is a sample meal plan that includes foods from the grain and starches, dairy, vegetable, fruit, and meat groups. A dietician can individualize a meal plan to fit your calorie needs and tell you the number of servings needed from each food group. However, controlling the total amount of carbohydrates in your meal or snack is more important than making sure you include all of the food groups at every  meal. You may interchange carbohydrate containing foods (dairy, starches, and fruits). The meal plan below is an example of a 2000 calorie diet using carbohydrate counting. This meal plan has 17 carbohydrate servings. Breakfast  1 cup oatmeal (2 carb servings).   cup light yogurt (1 carb serving).  1 cup blueberries (1 carb serving).   cup almonds. Snack  1 large apple (2 carb servings).  1 low-fat string cheese stick. Lunch  Chicken breast salad.  1 cup spinach.   cup chopped tomatoes.  2 oz chicken breast, sliced.  2 tbs low-fat Italian dressing.  12 whole-wheat crackers (2 carb servings).  12 to 15 grapes (1 carb serving).  1 cup low-fat milk (1 carb serving). Snack  1 cup carrots.   cup hummus (1 carb serving). Dinner  3 oz broiled salmon.  1 cup brown rice (3 carb servings). Snack  1  cups steamed broccoli (1 carb serving) drizzled with 1 tsp olive oil and lemon juice.  1 cup light pudding (2 carb servings). DIABETES MEAL PLANNING WORKSHEET Your dietician can use this worksheet to help you decide how many servings of foods and what types of foods are right for you.  BREAKFAST Food Group and Servings / Carb Servings Grain/Starches __________________________________ Dairy __________________________________________ Vegetable ______________________________________ Fruit ___________________________________________ Meat __________________________________________ Fat ____________________________________________ LUNCH Food Group and Servings / Carb Servings Grain/Starches ___________________________________ Dairy ___________________________________________ Fruit ____________________________________________ Meat ___________________________________________ Fat _____________________________________________ DINNER Food Group and Servings / Carb Servings Grain/Starches ___________________________________ Dairy  ___________________________________________ Fruit ____________________________________________ Meat ___________________________________________ Fat _____________________________________________ SNACKS Food Group and Servings / Carb Servings Grain/Starches ___________________________________ Dairy ___________________________________________ Vegetable _______________________________________ Fruit ____________________________________________ Meat ___________________________________________ Fat _____________________________________________ DAILY TOTALS Starches _________________________ Vegetable ________________________ Fruit ____________________________ Dairy ____________________________ Meat ____________________________ Fat ______________________________ Document Released: 12/30/2004 Document Revised: 06/27/2011 Document Reviewed: 11/10/2008 [employer] Patient Information 2014 [employer]. Fat and Cholesterol Control Diet Fat and cholesterol levels in your blood and organs are influenced by your diet. High levels of fat and cholesterol may lead to diseases of the heart, small and large blood vessels, gallbladder, liver, and pancreas. CONTROLLING FAT AND CHOLESTEROL WITH DIET Although exercise and lifestyle factors are important, your diet is key. That is because certain foods are known to raise cholesterol and others to lower it. The goal is to balance foods for their effect on cholesterol and more importantly, to replace saturated and trans fat with other types of fat, such as monounsaturated fat, polyunsaturated fat, and omega-3 fatty acids. On average, a person should consume no more than 15 to 17 g of saturated fat daily. Saturated and trans fats are considered "bad" fats, and they will raise LDL cholesterol. Saturated fats are primarily found in animal products such as meats, butter, and cream. However, that does not mean you need to give up all your favorite foods. Today, there are  good tasting, low-fat, low-cholesterol substitutes for most of the things you like to eat. Choose low-fat or nonfat alternatives. Choose round or   loin cuts of red meat. These types of cuts are lowest in fat and cholesterol. Chicken (without the skin), fish, veal, and ground turkey breast are great choices. Eliminate fatty meats, such as hot dogs and salami. Even shellfish have little or no saturated fat. Have a 3 oz (85 g) portion when you eat lean meat, poultry, or fish. Trans fats are also called "partially hydrogenated oils." They are oils that have been scientifically manipulated so that they are solid at room temperature resulting in a longer shelf life and improved taste and texture of foods in which they are added. Trans fats are found in stick margarine, some tub margarines, cookies, crackers, and baked goods.  When baking and cooking, oils are a great substitute for butter. The monounsaturated oils are especially beneficial since it is believed they lower LDL and raise HDL. The oils you should avoid entirely are saturated tropical oils, such as coconut and palm.  Remember to eat a lot from food groups that are naturally free of saturated and trans fat, including fish, fruit, vegetables, beans, grains (barley, rice, couscous, bulgur wheat), and pasta (without cream sauces).  IDENTIFYING FOODS THAT LOWER FAT AND CHOLESTEROL  Soluble fiber may lower your cholesterol. This type of fiber is found in fruits such as apples, vegetables such as broccoli, potatoes, and carrots, legumes such as beans, peas, and lentils, and grains such as barley. Foods fortified with plant sterols (phytosterol) may also lower cholesterol. You should eat at least 2 g per day of these foods for a cholesterol lowering effect.  Read package labels to identify low-saturated fats, trans fat free, and low-fat foods at the supermarket. Select cheeses that have only 2 to 3 g saturated fat per ounce. Use a heart-healthy tub margarine that  is free of trans fats or partially hydrogenated oil. When buying baked goods (cookies, crackers), avoid partially hydrogenated oils. Breads and muffins should be made from whole grains (whole-wheat or whole oat flour, instead of "flour" or "enriched flour"). Buy non-creamy canned soups with reduced salt and no added fats.  FOOD PREPARATION TECHNIQUES  Never deep-fry. If you must fry, either stir-fry, which uses very little fat, or use non-stick cooking sprays. When possible, broil, bake, or roast meats, and steam vegetables. Instead of putting butter or margarine on vegetables, use lemon and herbs, applesauce, and cinnamon (for squash and sweet potatoes). Use nonfat yogurt, salsa, and low-fat dressings for salads.  LOW-SATURATED FAT / LOW-FAT FOOD SUBSTITUTES Meats / Saturated Fat (g)  Avoid: Steak, marbled (3 oz/85 g) / 11 g  Choose: Steak, lean (3 oz/85 g) / 4 g  Avoid: Hamburger (3 oz/85 g) / 7 g  Choose: Hamburger, lean (3 oz/85 g) / 5 g  Avoid: Ham (3 oz/85 g) / 6 g  Choose: Ham, lean cut (3 oz/85 g) / 2.4 g  Avoid: Chicken, with skin, dark meat (3 oz/85 g) / 4 g  Choose: Chicken, skin removed, dark meat (3 oz/85 g) / 2 g  Avoid: Chicken, with skin, light meat (3 oz/85 g) / 2.5 g  Choose: Chicken, skin removed, light meat (3 oz/85 g) / 1 g Dairy / Saturated Fat (g)  Avoid: Whole milk (1 cup) / 5 g  Choose: Low-fat milk, 2% (1 cup) / 3 g  Choose: Low-fat milk, 1% (1 cup) / 1.5 g  Choose: Skim milk (1 cup) / 0.3 g  Avoid: Hard cheese (1 oz/28 g) / 6 g  Choose: Skim milk cheese (1 oz/28 g) /   2 to 3 g  Avoid: Cottage cheese, 4% fat (1 cup) / 6.5 g  Choose: Low-fat cottage cheese, 1% fat (1 cup) / 1.5 g  Avoid: Ice cream (1 cup) / 9 g  Choose: Sherbet (1 cup) / 2.5 g  Choose: Nonfat frozen yogurt (1 cup) / 0.3 g  Choose: Frozen fruit bar / trace  Avoid: Whipped cream (1 tbs) / 3.5 g  Choose: Nondairy whipped topping (1 tbs) / 1 g Condiments / Saturated Fat  (g)  Avoid: Mayonnaise (1 tbs) / 2 g  Choose: Low-fat mayonnaise (1 tbs) / 1 g  Avoid: Butter (1 tbs) / 7 g  Choose: Extra light margarine (1 tbs) / 1 g  Avoid: Coconut oil (1 tbs) / 11.8 g  Choose: Olive oil (1 tbs) / 1.8 g  Choose: Corn oil (1 tbs) / 1.7 g  Choose: Safflower oil (1 tbs) / 1.2 g  Choose: Sunflower oil (1 tbs) / 1.4 g  Choose: Soybean oil (1 tbs) / 2.4 g  Choose: Canola oil (1 tbs) / 1 g Document Released: 04/04/2005 Document Revised: 07/30/2012 Document Reviewed: 09/23/2010 [employer] Patient Information 2014 [employer].  

## 2013-03-07 LAB — VITAMIN D 25 HYDROXY (VIT D DEFICIENCY, FRACTURES): Vit D, 25-Hydroxy: 56 ng/mL (ref 30–89)

## 2013-03-07 LAB — INSULIN, FASTING: Insulin fasting, serum: 40 u[IU]/mL — ABNORMAL HIGH (ref 3–28)

## 2013-03-12 ENCOUNTER — Other Ambulatory Visit: Payer: Self-pay | Admitting: Internal Medicine

## 2013-03-12 MED ORDER — ATENOLOL 100 MG PO TABS: 50.0000 mg | ORAL_TABLET | Freq: Every day | ORAL | Status: DC

## 2013-03-27 ENCOUNTER — Ambulatory Visit (HOSPITAL_COMMUNITY)
Admission: RE | Admit: 2013-03-27 | Discharge: 2013-03-27 | Disposition: A | Discharge status: Home / Self Care | Payer: Managed Care, Other (non HMO) | Source: Ambulatory Visit | Attending: Emergency Medicine | Admitting: Emergency Medicine

## 2013-03-27 ENCOUNTER — Other Ambulatory Visit: Payer: Self-pay | Admitting: Emergency Medicine

## 2013-03-27 ENCOUNTER — Ambulatory Visit (INDEPENDENT_AMBULATORY_CARE_PROVIDER_SITE_OTHER): Payer: Managed Care, Other (non HMO) | Admitting: Emergency Medicine

## 2013-03-27 ENCOUNTER — Encounter: Payer: Self-pay | Admitting: Emergency Medicine

## 2013-03-27 VITALS — BP 122/80 | Temp 98.2°F | Resp 18 | Wt 280.0 lb

## 2013-03-27 DIAGNOSIS — M25562 Pain in left knee: Secondary | ICD-10-CM

## 2013-03-27 DIAGNOSIS — M25569 Pain in unspecified knee: Secondary | ICD-10-CM

## 2013-03-27 DIAGNOSIS — E291 Testicular hypofunction: Secondary | ICD-10-CM

## 2013-03-27 MED ORDER — TESTOSTERONE CYPIONATE 200 MG/ML IM SOLN: INTRAMUSCULAR | Status: DC

## 2013-03-27 NOTE — Patient Instructions (Signed)
Knee Pain Knee pain can be a result of an injury or other medical conditions. Treatment will depend on the cause of your pain. HOME CARE  Only take medicine as told by your doctor.  Keep a healthy weight. Being overweight can make the knee hurt more.  Stretch before exercising or playing sports.  If there is constant knee pain, change the way you exercise. Ask your doctor for advice.  Make sure shoes fit well. Choose the right shoe for the sport or activity.  Protect your knees. Wear kneepads if needed.  Rest when you are tired. GET HELP RIGHT AWAY IF:   Your knee pain does not stop.  Your knee pain does not get better.  Your knee joint feels hot to the touch.  You have a fever. MAKE SURE YOU:   Understand these instructions.  Will watch this condition.  Will get help right away if you are not doing well or get worse. Document Released: 07/01/2008 Document Revised: 06/27/2011 Document Reviewed: 07/01/2008 [employer] Patient Information 2014 [employer].  

## 2013-03-28 NOTE — Progress Notes (Signed)
   Subjective:    Patient ID: Nicholas Crosby, male    DOB: 01/23/1966, 48 y.o.   MRN: [id number]  HPI Comments: 47 YO overweight male restarted running on hard surface and has noticed left knee pain since. He has been exercising with the ellipitical without any problems. He notes pain is worse after rest, feels stiff and occasional gives out. He denies erythema or edema. He has taken OTC pain relief which have helped. No hx of previous trauma.  He would also like to d/c androgel due to risks with small children and restart injections. He has a trainer who will give shots.  Knee Pain      Current Outpatient Prescriptions on File Prior to Visit  Medication Sig Dispense Refill  . aspirin 81 MG tablet Take 81 mg by mouth daily.      Marland Kitchen atenolol (TENORMIN) 100 MG tablet Take 0.5 tablets (50 mg total) by mouth daily.  90 tablet  2  . buPROPion (WELLBUTRIN XL) 300 MG 24 hr tablet Take 300 mg by mouth daily.      . Cholecalciferol (VITAMIN D3) 5000 UNIT/ML LIQD Take 5,000 Units by mouth daily.      . enalapril (VASOTEC) 10 MG tablet Take 10 mg by mouth daily.      . hydrochlorothiazide (HYDRODIURIL) 25 MG tablet Take 25 mg by mouth daily.      . pravastatin (PRAVACHOL) 20 MG tablet Take 20 mg by mouth daily.      . verapamil (CALAN) 120 MG tablet Take 120 mg by mouth once.        No current facility-administered medications on file prior to visit.   allergies Zocor and Crestor  Past Medical History  Diagnosis Date  . Hypertension   . Hyperlipidemia   . Other testicular hypofunction   . Unspecified vitamin D deficiency   . Depression     Review of Systems  Musculoskeletal: Positive for arthralgias and myalgias.  All other systems reviewed and are negative.    BP 122/80  Temp(Src) 98.2 F (36.8 C) (Temporal)  Resp 18  Wt 280 lb (127.007 kg)     Objective:   Physical Exam  Nursing note and vitals reviewed. Constitutional: He is oriented to person, place, and time. He appears  well-developed and well-nourished.  HENT:  Head: Normocephalic.  Eyes: Conjunctivae are normal.  Cardiovascular: Normal rate, regular rhythm, normal heart sounds and intact distal pulses.   Pulmonary/Chest: Effort normal and breath sounds normal.  Musculoskeletal: Normal range of motion. He exhibits no edema and no tenderness.  Mild crepitus Bilateral knees. Otherwise WNL  Neurological: He is alert and oriented to person, place, and time. He has normal reflexes. No cranial nerve deficit. Coordination normal.  Skin: Skin is warm and dry.  Psychiatric: Judgment normal.          Assessment & Plan:  1. left knee pain- XRAY, R.I.C.E, if neg refer Ortho for evaluation 2. Hypogonadism- D/C Androgel and restart depo-testosterone 200mg /ml 2 cc q 2 weeks

## 2013-04-24 ENCOUNTER — Other Ambulatory Visit: Payer: Self-pay | Admitting: Internal Medicine

## 2013-04-24 MED ORDER — VALACYCLOVIR HCL 1 G PO TABS: ORAL_TABLET | ORAL | Status: DC

## 2013-04-29 ENCOUNTER — Ambulatory Visit (INDEPENDENT_AMBULATORY_CARE_PROVIDER_SITE_OTHER): Payer: Managed Care, Other (non HMO) | Admitting: Internal Medicine

## 2013-04-29 ENCOUNTER — Encounter: Payer: Self-pay | Admitting: Internal Medicine

## 2013-04-29 VITALS — BP 142/88 | HR 72 | Temp 98.1°F | Resp 16 | Wt 262.8 lb

## 2013-04-29 DIAGNOSIS — E291 Testicular hypofunction: Secondary | ICD-10-CM

## 2013-04-29 DIAGNOSIS — R7303 Prediabetes: Secondary | ICD-10-CM

## 2013-04-29 DIAGNOSIS — R7309 Other abnormal glucose: Secondary | ICD-10-CM

## 2013-04-29 DIAGNOSIS — N529 Male erectile dysfunction, unspecified: Secondary | ICD-10-CM

## 2013-04-29 DIAGNOSIS — E782 Mixed hyperlipidemia: Secondary | ICD-10-CM

## 2013-04-29 DIAGNOSIS — Z79899 Other long term (current) drug therapy: Secondary | ICD-10-CM

## 2013-04-29 DIAGNOSIS — E559 Vitamin D deficiency, unspecified: Secondary | ICD-10-CM

## 2013-04-29 DIAGNOSIS — I10 Essential (primary) hypertension: Secondary | ICD-10-CM

## 2013-04-29 LAB — HEMOGLOBIN A1C
Hgb A1c MFr Bld: 6.1 % — ABNORMAL HIGH (ref <5.7)
Mean Plasma Glucose: 128 mg/dL — ABNORMAL HIGH (ref <117)

## 2013-04-29 LAB — MAGNESIUM: Magnesium: 2.1 mg/dL (ref 1.5–2.5)

## 2013-04-29 LAB — BASIC METABOLIC PANEL WITH GFR
BUN: 12 mg/dL (ref 6–23)
CO2: 31 mEq/L (ref 19–32)
Calcium: 9.9 mg/dL (ref 8.4–10.5)
Chloride: 101 mEq/L (ref 96–112)
Creat: 0.99 mg/dL (ref 0.50–1.35)
GFR, Est African American: >89 mL/min
GFR, Est Non African American: >89 mL/min
Glucose, Bld: 102 mg/dL — ABNORMAL HIGH (ref 70–99)
Potassium: 4.1 mEq/L (ref 3.5–5.3)
Sodium: 139 mEq/L (ref 135–145)

## 2013-04-29 LAB — HEPATIC FUNCTION PANEL
ALT: 27 U/L (ref 0–53)
AST: 21 U/L (ref 0–37)
Albumin: 4.4 g/dL (ref 3.5–5.2)
Alkaline Phosphatase: 51 U/L (ref 39–117)
Bilirubin, Direct: 0.1 mg/dL (ref 0.0–0.3)
Indirect Bilirubin: 0.4 mg/dL (ref 0.0–0.9)
Total Bilirubin: 0.5 mg/dL (ref 0.3–1.2)
Total Protein: 7.5 g/dL (ref 6.0–8.3)

## 2013-04-29 LAB — CBC WITH DIFFERENTIAL/PLATELET
Basophils Absolute: 0 10*3/uL (ref 0.0–0.1)
Basophils Relative: 0 % (ref 0–1)
Eosinophils Absolute: 0 10*3/uL (ref 0.0–0.7)
Eosinophils Relative: 1 % (ref 0–5)
HCT: 42.5 % (ref 39.0–52.0)
Hemoglobin: 14.6 g/dL (ref 13.0–17.0)
Lymphocytes Relative: 34 % (ref 12–46)
Lymphs Abs: 2.3 10*3/uL (ref 0.7–4.0)
MCH: 27.6 pg (ref 26.0–34.0)
MCHC: 34.4 g/dL (ref 30.0–36.0)
MCV: 80.3 fL (ref 78.0–100.0)
Monocytes Absolute: 0.5 10*3/uL (ref 0.1–1.0)
Monocytes Relative: 7 % (ref 3–12)
Neutro Abs: 3.9 10*3/uL (ref 1.7–7.7)
Neutrophils Relative %: 58 % (ref 43–77)
Platelets: 328 10*3/uL (ref 150–400)
RBC: 5.29 MIL/uL (ref 4.22–5.81)
RDW: 14.1 % (ref 11.5–15.5)
WBC: 6.8 10*3/uL (ref 4.0–10.5)

## 2013-04-29 LAB — LIPID PANEL
Cholesterol: 177 mg/dL (ref 0–200)
HDL: 32 mg/dL — ABNORMAL LOW (ref >39)
LDL Cholesterol: 96 mg/dL (ref 0–99)
Total CHOL/HDL Ratio: 5.5 Ratio
Triglycerides: 244 mg/dL — ABNORMAL HIGH (ref <150)
VLDL: 49 mg/dL — ABNORMAL HIGH (ref 0–40)

## 2013-04-29 LAB — TSH: TSH: 0.78 u[IU]/mL (ref 0.350–4.500)

## 2013-04-29 MED ORDER — CLOTRIMAZOLE-BETAMETHASONE 1-0.05 % EX CREA: 1.0000 "application " | TOPICAL_CREAM | Freq: Two times a day (BID) | CUTANEOUS | Status: DC

## 2013-04-29 NOTE — Progress Notes (Signed)
Patient ID: Nicholas Crosby, male   DOB: 1966-02-02, 48 y.o.   MRN: [id number]   This very nice 48 y.o. [name] presents for 3 month follow up with Hypertension, Hyperlipidemia, Pre-Diabetes, Testosterone  and Vitamin D Deficiency.    HTN predates since 16. BP has been controlled at home. Today's BP: 142/88 mmHg . Patient denies any cardiac type chest pain, palpitations, dyspnea/orthopnea/PND, dizziness, claudication, or dependent edema.   Hyperlipidemia is controlled with diet & meds. Last Cholesterol was  170, Triglycerides were 259, HDL 39 and LDL 87 in Aug 2014 at goal. Since then he alleges as strict Vegan diet and has a 6.5 # weight loss and has stopped his Pravastatin for 1 week. Patient denies myalgias or other med SE's.    Also, the patient has history of PreDiabetes/insulin resistance since Aug 2012 with A1c 6.4 % with last A1c of 5.8 % in Aug 2014. Patient denies any symptoms of reactive hypoglycemia, diabetic polys, paresthesias or visual blurring.   Further, Patient has history of Vitamin D Deficiency  Of 21 in 2008 with last vitamin D of 44  Still fairly low in Aug 2014. Patient supplements vitamin D without any suspected side-effects.    Medication List       This list is accurate as of: 04/29/13  7:35 PM.  Always use your most recent med list.               aspirin 81 MG tablet  Take 81 mg by mouth daily.     atenolol 100 MG tablet  Commonly known as:  TENORMIN  Take 50 mg by mouth daily. Takes 1 whole tab daily     buPROPion 300 MG 24 hr tablet  Commonly known as:  WELLBUTRIN XL  Take 300 mg by mouth daily.     clotrimazole-betamethasone cream  Commonly known as:  LOTRISONE  Apply 1 application topically 2 (two) times daily.     enalapril 10 MG tablet  Commonly known as:  VASOTEC  Take 10 mg by mouth daily.     hydrochlorothiazide 25 MG tablet  Commonly known as:  HYDRODIURIL  Take 25 mg by mouth daily.     pravastatin 20 MG tablet  Commonly known as:   PRAVACHOL  Take 20 mg by mouth daily.     testosterone cypionate 200 MG/ML injection  Commonly known as:  DEPOTESTOTERONE CYPIONATE  2cc q 2 weeks     valACYclovir 1000 MG tablet  Commonly known as:  VALTREX  1 tablet 3 x daily for cld sores / fever b;listers     verapamil 120 MG tablet  Commonly known as:  CALAN  Take 120 mg by mouth once.     Vitamin D3 5000 UNIT/ML Liqd  Take 5,000 Units by mouth daily.         Allergies  Allergen Reactions  . Zocor [Simvastatin] Other (See Comments)    cpk elev  . Crestor [Rosuvastatin] Palpitations    PMHx:   Past Medical History  Diagnosis Date  . Hypertension   . Hyperlipidemia   . Other testicular hypofunction   . Unspecified vitamin D deficiency   . Depression     FHx:    Reviewed / unchanged  SHx:    Reviewed / unchanged  Systems Review: Constitutional: Denies fever, chills, wt changes, headaches, insomnia, fatigue, night sweats, change in appetite. Eyes: Denies redness, blurred vision, diplopia, discharge, itchy, watery eyes.  ENT: Denies discharge, congestion, post nasal drip, epistaxis, sore throat,  earache, hearing loss, dental pain, tinnitus, vertigo, sinus pain, snoring.  CV: Denies chest pain, palpitations, irregular heartbeat, syncope, dyspnea, diaphoresis, orthopnea, PND, claudication, edema. Respiratory: denies cough, dyspnea, DOE, pleurisy, hoarseness, laryngitis, wheezing.  Gastrointestinal: Denies dysphagia, odynophagia, heartburn, reflux, water brash, abdominal pain or cramps, nausea, vomiting, bloating, diarrhea, constipation, hematemesis, melena, hematochezia,  or hemorrhoids. Genitourinary: Denies dysuria, frequency, urgency, nocturia, hesitancy, discharge, hematuria, flank pain. Musculoskeletal: Denies arthralgias, myalgias, stiffness, jt. swelling, pain, limp, strain/sprain.  Skin: Denies pruritus, rash, hives, warts, acne, eczema, change in skin lesion(s). Neuro: No weakness, tremor, incoordination,  spasms, paresthesia, or pain. Psychiatric: Denies confusion, memory loss, or sensory loss. Endo: Denies change in weight, skin, hair change.  Heme/Lymph: No excessive bleeding, bruising, orenlarged lymph nodes.  Filed Vitals:   04/29/13 1245  BP: 142/88  Pulse: 72  Temp: 98.1 F (36.7 C)  Resp: 16    Estimated body mass index is 36.67 kg/(m^2) as calculated from the following:   Height as of 05/24/12: 5\' 11"  (1.803 m).   Weight as of this encounter: 262 lb 12.8 oz (119.205 kg).  On Exam: Appears well nourished - in no distress. Eyes: PERRLA, EOMs, conjunctiva no swelling or erythema. Sinuses: No frontal/maxillary tenderness ENT/Mouth: EAC's clear, TM's nl w/o erythema, bulging. Nares clear w/o erythema, swelling, exudates. Oropharynx clear without erythema or exudates. Oral hygiene is good. Tongue normal, non obstructing. Hearing intact.  Neck: Supple. Thyroid nl. Car 2+/2+ without bruits, nodes or JVD. Chest: Respirations nl with BS clear & equal w/o rales, rhonchi, wheezing or stridor.  Cor: Heart sounds normal w/ regular rate and rhythm without sig. murmurs, gallops, clicks, or rubs. Peripheral pulses normal and equal  without edema.  Abdomen: Soft & bowel sounds normal. Non-tender w/o guarding, rebound, hernias, masses, or organomegaly.  Lymphatics: Unremarkable.  Musculoskeletal: Full ROM all peripheral extremities, joint stability, 5/5 strength, and normal gait.  Skin: Warm, dry without exposed rashes, lesions, ecchymosis apparent. Few velvety satellite skin lesions in the pubic area suspect for candidiasis Neuro: Cranial nerves intact, reflexes equal bilaterally. Sensory-motor testing grossly intact. Tendon reflexes grossly intact.  Pysch: Alert & oriented x 3. Insight and judgement nl & appropriate. No ideations.  Assessment and Plan:  1. Hypertension - Continue monitor blood pressure at home. Continue diet/meds same.  2. Hyperlipidemia - Continue diet and determine need  for restarting meds pending today's labs, also continue exercise & lifestyle modifications. Continue monitor periodic cholesterol/liver & renal functions   3. Pre-diabetes/Insulin Resistance - Continue diet, exercise, lifestyle modifications. Monitor appropriate labs.  4. Vitamin D Deficiency - Continue supplementation.  Pubic Rash - suspect candida - try lotrisone  Recommended regular exercise, BP monitoring, weight control, and discussed med and SE's. Recommended labs to assess and monitor clinical status. Further disposition pending results of labs.

## 2013-04-29 NOTE — Patient Instructions (Signed)
Testosterone This test is used to determine if your testosterone level is abnormal. This could be used to explain difficulty getting an erection (erectile dysfunction), inability of your partner to get pregnant (infertility), premature or delayed puberty if you are male, or the appearance of masculine physical features if you are male. PREPARATION FOR TEST A blood sample is obtained by inserting a needle into a vein in the arm. NORMAL FINDINGS  Free Testosterone: 0.3-2 pg/mL  % Free Testosterone: 0.1%-0.3% Total Testosterone:  7 mos-9 yrs (Tanner Stage I)  Male: Less than 30 ng/dL  Male: Less than 30 ng/dL  10-13 yrs (Tanner Stage II)  Male: Less than 300 ng/dL  Male: Less than 40 ng/dL  14-15 yrs (Tanner Stage III)  Male: 170-540 ng/dL  Male: Less than 60 ng/dL  16-19 yrs (Tanner Stage IV, V)  Male: 250-910 ng/dL  Male: Less than 70 ng/dL  20 yrs and over  Male: [phone] ng/dL  Male: Less than 70 ng/dL Ranges for normal findings may vary among different laboratories and hospitals. You should always check with your doctor after having lab work or other tests done to discuss the meaning of your test results and whether your values are considered within normal limits. MEANING OF TEST  Your caregiver will go over the test results with you and discuss the importance and meaning of your results, as well as treatment options and the need for additional tests if necessary. OBTAINING THE TEST RESULTS It is your responsibility to obtain your test results. Ask the lab or department performing the test when and how you will get your results. Document Released: 04/21/2004 Document Revised: 06/27/2011 Document Reviewed: 03/16/2008 [facility] Patient Information 2014 [place], [place].  Hypertension As your heart beats, it forces blood through your arteries. This force is your blood pressure. If the pressure is too high, it is called hypertension (HTN) or high blood  pressure. HTN is dangerous because you may have it and not know it. High blood pressure may mean that your heart has to work harder to pump blood. Your arteries may be narrow or stiff. The extra work puts you at risk for heart disease, stroke, and other problems.  Blood pressure consists of two numbers, a higher number over a lower, 110/72, for example. It is stated as "110 over 72." The ideal is below 120 for the top number (systolic) and under 80 for the bottom (diastolic). Write down your blood pressure today. You should pay close attention to your blood pressure if you have certain conditions such as:  Heart failure.  Prior heart attack.  Diabetes  Chronic kidney disease.  Prior stroke.  Multiple risk factors for heart disease. To see if you have HTN, your blood pressure should be measured while you are seated with your arm held at the level of the heart. It should be measured at least twice. A one-time elevated blood pressure reading (especially in the Emergency Department) does not mean that you need treatment. There may be conditions in which the blood pressure is different between your right and left arms. It is important to see your caregiver soon for a recheck. Most people have essential hypertension which means that there is not a specific cause. This type of high blood pressure may be lowered by changing lifestyle factors such as:  Stress.  Smoking.  Lack of exercise.  Excessive weight.  Drug/tobacco/alcohol use.  Eating less salt. Most people do not have symptoms from high blood pressure until it has caused damage  to the body. Effective treatment can often prevent, delay or reduce that damage. TREATMENT  When a cause has been identified, treatment for high blood pressure is directed at the cause. There are a large number of medications to treat HTN. These fall into several categories, and your caregiver will help you select the medicines that are best for you. Medications  may have side effects. You should review side effects with your caregiver. If your blood pressure stays high after you have made lifestyle changes or started on medicines,   Your medication(s) may need to be changed.  Other problems may need to be addressed.  Be certain you understand your prescriptions, and know how and when to take your medicine.  Be sure to follow up with your caregiver within the time frame advised (usually within two weeks) to have your blood pressure rechecked and to review your medications.  If you are taking more than one medicine to lower your blood pressure, make sure you know how and at what times they should be taken. Taking two medicines at the same time can result in blood pressure that is too low. SEEK IMMEDIATE MEDICAL CARE IF:  You develop a severe headache, blurred or changing vision, or confusion.  You have unusual weakness or numbness, or a faint feeling.  You have severe chest or abdominal pain, vomiting, or breathing problems. MAKE SURE YOU:   Understand these instructions.  Will watch your condition.  Will get help right away if you are not doing well or get worse. Document Released: 04/04/2005 Document Revised: 06/27/2011 Document Reviewed: 11/23/2007 [facility] Patient Information 2014 [place].  Diabetes and Exercise Exercising regularly is important. It is not just about losing weight. It has many health benefits, such as:  Improving your overall fitness, flexibility, and endurance.  Increasing your bone density.  Helping with weight control.  Decreasing your body fat.  Increasing your muscle strength.  Reducing stress and tension.  Improving your overall health. People with diabetes who exercise gain additional benefits because exercise:  Reduces appetite.  Improves the body's use of blood sugar (glucose).  Helps lower or control blood glucose.  Decreases blood pressure.  Helps control blood lipids (such as  cholesterol and triglycerides).  Improves the body's use of the hormone insulin by:  Increasing the body's insulin sensitivity.  Reducing the body's insulin needs.  Decreases the risk for heart disease because exercising:  Lowers cholesterol and triglycerides levels.  Increases the levels of good cholesterol (such as high-density lipoproteins [HDL]) in the body.  Lowers blood glucose levels. YOUR ACTIVITY PLAN  Choose an activity that you enjoy and set realistic goals. Your health care provider or diabetes educator can help you make an activity plan that works for you. You can break activities into 2 or 3 sessions throughout the day. Doing so is as good as one long session. Exercise ideas include:  Taking the dog for a walk.  Taking the stairs instead of the elevator.  Dancing to your favorite song.  Doing your favorite exercise with a friend. RECOMMENDATIONS FOR EXERCISING WITH TYPE 1 OR TYPE 2 DIABETES   Check your blood glucose before exercising. If blood glucose levels are greater than 240 mg/dL, check for urine ketones. Do not exercise if ketones are present.  Avoid injecting insulin into areas of the body that are going to be exercised. For example, avoid injecting insulin into:  The arms when playing tennis.  The legs when jogging.  Keep a record of:  Food intake before and after you exercise.  Expected peak times of insulin action.  Blood glucose levels before and after you exercise.  The type and amount of exercise you have done.  Review your records with your health care provider. Your health care provider will help you to develop guidelines for adjusting food intake and insulin amounts before and after exercising.  If you take insulin or oral hypoglycemic agents, watch for signs and symptoms of hypoglycemia. They include:  Dizziness.  Shaking.  Sweating.  Chills.  Confusion.  Drink plenty of water while you exercise to prevent dehydration or heat  stroke. Body water is lost during exercise and must be replaced.  Talk to your health care provider before starting an exercise program to make sure it is safe for you. Remember, almost any type of activity is better than none. Document Released: 06/25/2003 Document Revised: 12/05/2012 Document Reviewed: 09/11/2012 [facility] Patient Information 2014 [place].  Cholesterol Cholesterol is a white, waxy, fat-like protein needed by your body in small amounts. The liver makes all the cholesterol you need. It is carried from the liver by the blood through the blood vessels. Deposits (plaque) may build up on blood vessel walls. This makes the arteries narrower and stiffer. Plaque increases the risk for heart attack and stroke. You cannot feel your cholesterol level even if it is very high. The only way to know is by a blood test to check your lipid (fats) levels. Once you know your cholesterol levels, you should keep a record of the test results. Work with your caregiver to to keep your levels in the desired range. WHAT THE RESULTS MEAN:  Total cholesterol is a rough measure of all the cholesterol in your blood.  LDL is the so-called bad cholesterol. This is the type that deposits cholesterol in the walls of the arteries. You want this level to be low.  HDL is the good cholesterol because it cleans the arteries and carries the LDL away. You want this level to be high.  Triglycerides are fat that the body can either burn for energy or store. High levels are closely linked to heart disease. DESIRED LEVELS:  Total cholesterol below 200.  LDL below 100 for people at risk, below 70 for very high risk.  HDL above 50 is good, above 60 is best.  Triglycerides below 150. HOW TO LOWER YOUR CHOLESTEROL:  Diet.  Choose fish or white meat chicken and [place], roasted or baked. Limit fatty cuts of red meat, fried foods, and processed meats, such as sausage and lunch meat.  Eat lots of fresh fruits  and vegetables. Choose whole grains, beans, pasta, potatoes and cereals.  Use only small amounts of olive, corn or canola oils. Avoid butter, mayonnaise, shortening or palm kernel oils. Avoid foods with trans-fats.  Use skim/nonfat milk and low-fat/nonfat yogurt and cheeses. Avoid whole milk, cream, ice cream, egg yolks and cheeses. Healthy desserts include angel food cake, ginger snaps, animal crackers, hard candy, popsicles, and low-fat/nonfat frozen yogurt. Avoid pastries, cakes, pies and cookies.  Exercise.  A regular program helps decrease LDL and raises HDL.  Helps with weight control.  Do things that increase your activity level like gardening, walking, or taking the stairs.  Medication.  May be prescribed by your caregiver to help lowering cholesterol and the risk for heart disease.  You may need medicine even if your levels are normal if you have several risk factors. HOME CARE INSTRUCTIONS   Follow your diet and exercise  programs as suggested by your caregiver.  Take medications as directed.  Have blood work done when your caregiver feels it is necessary. MAKE SURE YOU:   Understand these instructions.  Will watch your condition.  Will get help right away if you are not doing well or get worse. Document Released: 12/28/2000 Document Revised: 06/27/2011 Document Reviewed: 01/16/2013 [facility] Patient Information 2014 [place], [place].  Vitamin D Deficiency Vitamin D is an important vitamin that your body needs. Having too little of it in your body is called a deficiency. A very bad deficiency can make your bones soft and can cause a condition called rickets.  Vitamin D is important to your body for different reasons, such as:   It helps your body absorb 2 minerals called calcium and phosphorus.  It helps make your bones healthy.  It may prevent some diseases, such as diabetes and multiple sclerosis.  It helps your muscles and heart. You can get vitamin D in  several ways. It is a natural part of some foods. The vitamin is also added to some dairy products and cereals. Some people take vitamin D supplements. Also, your body makes vitamin D when you are in the sun. It changes the sun's rays into a form of the vitamin that your body can use. CAUSES   Not eating enough foods that contain vitamin D.  Not getting enough sunlight.  Having certain digestive system diseases that make it hard to absorb vitamin D. These diseases include Crohn's disease, chronic pancreatitis, and cystic fibrosis.  Having a surgery in which part of the stomach or small intestine is removed.  Being obese. Fat cells pull vitamin D out of your blood. That means that obese people may not have enough vitamin D left in their blood and in other body tissues.  Having chronic kidney or liver disease. RISK FACTORS Risk factors are things that make you more likely to develop a vitamin D deficiency. They include:  Being older.  Not being able to get outside very much.  Living in a nursing home.  Having had broken bones.  Having weak or thin bones (osteoporosis).  Having a disease or condition that changes how your body absorbs vitamin D.  Having dark skin.  Some medicines such as seizure medicines or steroids.  Being overweight or obese. SYMPTOMS Mild cases of vitamin D deficiency may not have any symptoms. If you have a very bad case, symptoms may include:  Bone pain.  Muscle pain.  Falling often.  Broken bones caused by a minor injury, due to osteoporosis. DIAGNOSIS A blood test is the best way to tell if you have a vitamin D deficiency. TREATMENT Vitamin D deficiency can be treated in different ways. Treatment for vitamin D deficiency depends on what is causing it. Options include:  Taking vitamin D supplements.  Taking a calcium supplement. Your caregiver will suggest what dose is best for you. HOME CARE INSTRUCTIONS  Take any supplements that your  caregiver prescribes. Follow the directions carefully. Take only the suggested amount.  Have your blood tested 2 months after you start taking supplements.  Eat foods that contain vitamin D. Healthy choices include:  Fortified dairy products, cereals, or juices. Fortified means vitamin D has been added to the food. Check the label on the package to be sure.  Fatty fish like salmon or trout.  Eggs.  Oysters.  Do not use a tanning bed.  Keep your weight at a healthy level. Lose weight if you need to.  Keep all follow-up appointments. Your caregiver will need to perform blood tests to make sure your vitamin D deficiency is going away. SEEK MEDICAL CARE IF:  You have any questions about your treatment.  You continue to have symptoms of vitamin D deficiency.  You have nausea or vomiting.  You are constipated.  You feel confused.  You have severe abdominal or back pain. MAKE SURE YOU:  Understand these instructions.  Will watch your condition.  Will get help right away if you are not doing well or get worse. Document Released: 06/27/2011 Document Revised: 07/30/2012 Document Reviewed: 06/27/2011 [facility] Patient Information 2014 [place].

## 2013-04-30 LAB — INSULIN, FASTING: Insulin fasting, serum: 15 u[IU]/mL (ref 3–28)

## 2013-04-30 LAB — VITAMIN D 25 HYDROXY (VIT D DEFICIENCY, FRACTURES): Vit D, 25-Hydroxy: 69 ng/mL (ref 30–89)

## 2013-04-30 LAB — TESTOSTERONE: Testosterone: 157 ng/dL — ABNORMAL LOW (ref 300–890)

## 2013-06-06 ENCOUNTER — Ambulatory Visit: Payer: Self-pay | Admitting: Internal Medicine

## 2013-07-30 ENCOUNTER — Encounter: Payer: Self-pay | Admitting: Emergency Medicine

## 2013-07-30 ENCOUNTER — Ambulatory Visit (INDEPENDENT_AMBULATORY_CARE_PROVIDER_SITE_OTHER): Payer: Managed Care, Other (non HMO) | Admitting: Emergency Medicine

## 2013-07-30 VITALS — BP 128/86 | HR 68 | Temp 98.6°F | Resp 18 | Ht 71.5 in | Wt 275.0 lb

## 2013-07-30 DIAGNOSIS — I10 Essential (primary) hypertension: Secondary | ICD-10-CM

## 2013-07-30 DIAGNOSIS — E782 Mixed hyperlipidemia: Secondary | ICD-10-CM

## 2013-07-30 DIAGNOSIS — R7309 Other abnormal glucose: Secondary | ICD-10-CM

## 2013-07-30 DIAGNOSIS — E291 Testicular hypofunction: Secondary | ICD-10-CM

## 2013-07-30 DIAGNOSIS — E669 Obesity, unspecified: Secondary | ICD-10-CM

## 2013-07-30 LAB — CBC WITH DIFFERENTIAL/PLATELET
Basophils Absolute: 0 10*3/uL (ref 0.0–0.1)
Basophils Relative: 0 % (ref 0–1)
Eosinophils Absolute: 0.1 10*3/uL (ref 0.0–0.7)
Eosinophils Relative: 2 % (ref 0–5)
HCT: 37.6 % — ABNORMAL LOW (ref 39.0–52.0)
Hemoglobin: 12.9 g/dL — ABNORMAL LOW (ref 13.0–17.0)
Lymphocytes Relative: 28 % (ref 12–46)
Lymphs Abs: 2 10*3/uL (ref 0.7–4.0)
MCH: 27.4 pg (ref 26.0–34.0)
MCHC: 34.3 g/dL (ref 30.0–36.0)
MCV: 80 fL (ref 78.0–100.0)
Monocytes Absolute: 0.7 10*3/uL (ref 0.1–1.0)
Monocytes Relative: 10 % (ref 3–12)
Neutro Abs: 4.3 10*3/uL (ref 1.7–7.7)
Neutrophils Relative %: 60 % (ref 43–77)
Platelets: 282 10*3/uL (ref 150–400)
RBC: 4.7 MIL/uL (ref 4.22–5.81)
RDW: 15.1 % (ref 11.5–15.5)
WBC: 7.2 10*3/uL (ref 4.0–10.5)

## 2013-07-30 LAB — HEMOGLOBIN A1C
Hgb A1c MFr Bld: 6.1 % — ABNORMAL HIGH (ref <5.7)
Mean Plasma Glucose: 128 mg/dL — ABNORMAL HIGH (ref <117)

## 2013-07-30 NOTE — Progress Notes (Signed)
Subjective:    Patient ID: Nicholas Crosby, male    DOB: [birth year], 48 y.o.   MRN: [id number]  HPI Comments: 48 yo male presents for 3 month F/U for HTN, Cholesterol, Pre-Dm, D. Deficient. He has been exercising a little more and decreasing meat/ carbs. He is not checking BP routinely. He notes he is feeling a little better with changes.  Last labs  CHOL         177   04/29/2013 HDL           32   04/29/2013 LDLCALC       96   04/29/2013 TRIG         244   04/29/2013 CHOLHDL      5.5   04/29/2013 ALT           27   04/29/2013 AST           21   04/29/2013 ALKPHOS       51   04/29/2013 BILITOT      0.5   04/29/2013 CREATININE     0.99   04/29/2013 BUN              12   04/29/2013 NA              139   04/29/2013 K               4.1   04/29/2013 CL              101   04/29/2013 CO2              31   04/29/2013 HGBA1C      6.1   04/29/2013 Testosterone 157  He WAS off shots x several months, restarted shot 2 cc last week for every 2 week rotation. He notes minimal change with LE edema but is contiuing to monitor otherwise denies any SE.   Hyperlipidemia  Hypertension     Medication List       This list is accurate as of: 07/30/13 11:07 AM.  Always use your most recent med list.               aspirin 81 MG tablet  Take 81 mg by mouth daily.     atenolol 100 MG tablet  Commonly known as:  TENORMIN  Take 50 mg by mouth daily. Takes 1 whole tab daily     buPROPion 300 MG 24 hr tablet  Commonly known as:  WELLBUTRIN XL  Take 300 mg by mouth daily.     clotrimazole-betamethasone cream  Commonly known as:  LOTRISONE  Apply 1 application topically 2 (two) times daily.     enalapril 10 MG tablet  Commonly known as:  VASOTEC  Take 10 mg by mouth daily.     hydrochlorothiazide 25 MG tablet  Commonly known as:  HYDRODIURIL  Take 25 mg by mouth daily.     pravastatin 20 MG tablet  Commonly known as:  PRAVACHOL  Take 20 mg by mouth daily.     testosterone cypionate 200 MG/ML  injection  Commonly known as:  DEPOTESTOTERONE CYPIONATE  2cc q 2 weeks     valACYclovir 1000 MG tablet  Commonly known as:  VALTREX  1 tablet 3 x daily for cld sores / fever b;listers     verapamil 120 MG tablet  Commonly known as:  CALAN  Take 120 mg by mouth once.     Vitamin D3 5000 UNIT/ML  Liqd  Take 5,000 Units by mouth daily.       Allergies  Allergen Reactions  . Zocor [Simvastatin] Other (See Comments)    cpk elev  . Crestor [Rosuvastatin] Palpitations   Past Medical History  Diagnosis Date  . Hypertension   . Hyperlipidemia   . Other testicular hypofunction   . Unspecified vitamin D deficiency   . Depression       Review of Systems  All other systems reviewed and are negative.  BP 128/86  Pulse 68  Temp(Src) 98.6 F (37 C) (Temporal)  Resp 18  Ht 5' 11.5" (1.816 m)  Wt 275 lb (124.739 kg)  BMI 37.82 kg/m2     Objective:   Physical Exam  Nursing note and vitals reviewed. Constitutional: He is oriented to person, place, and time. He appears well-developed and well-nourished.  Central obesity  HENT:  Head: Normocephalic and atraumatic.  Right Ear: External ear normal.  Left Ear: External ear normal.  Nose: Nose normal.  Eyes: Conjunctivae and EOM are normal.  Neck: Normal range of motion. Neck supple. No JVD present. No thyromegaly present.  Cardiovascular: Normal rate, regular rhythm, normal heart sounds and intact distal pulses.   Pulmonary/Chest: Effort normal and breath sounds normal.  Abdominal: Soft. Bowel sounds are normal. He exhibits no distension and no mass. There is no tenderness. There is no rebound and no guarding.  Musculoskeletal: Normal range of motion. He exhibits no edema and no tenderness.  Lymphadenopathy:    He has no cervical adenopathy.  Neurological: He is alert and oriented to person, place, and time. He has normal reflexes. No cranial nerve deficit. Coordination normal.  Skin: Skin is warm and dry.  Psychiatric: He  has a normal mood and affect. His behavior is normal. Judgment and thought content normal.          Assessment & Plan:  1.  3 month F/Obesity, HTN, Cholesterol, Pre-Dm, D. Deficient. Needs healthy diet, cardio QD and obtain healthy weight. Check Labs, Check BP if >130/80 call office   2. Hypogonadism- Check labs, advise weight loss, increase activity. Add avocados/ almonds/ zinc if able to tolerate.

## 2013-07-30 NOTE — Patient Instructions (Signed)
   Bad carbs also include fruit juice, alcohol, and sweet tea. These are empty calories that do not signal to your brain that you are full.   Please remember the good carbs are still carbs which convert into sugar. So please measure them out no more than 1/2-1 cup of rice, oatmeal, pasta, and beans.  Veggies are however free foods! Pile them on.   I like lean protein at every meal such as chicken, turkey, pork chops, cottage cheese, etc. Just do not fry these meats and please center your meal around vegetable, the meats should be a side dish.   No all fruit is created equal. Please see the list below, the fruit at the bottom is higher in sugars than the fruit at the top   We want weight loss that will last so you should lose 1-2 pounds a week.  THAT IS IT! Please pick THREE things a month to change. Once it is a habit check off the item. Then pick another three items off the list to become habits.  If you are already doing a habit on the list GREAT!  Cross that item off! o Don't drink your calories. Ie, alcohol, soda, fruit juice, and sweet tea.  o Drink more water. Drink a glass when you feel hungry or before each meal.  o Eat breakfast - Complex carb and protein (likeDannon light and fit yogurt, oatmeal, fruit, eggs, turkey bacon). o Measure your cereal.  Eat no more than one cup a day. (ie Kashi) o Eat an apple a day. o Add a vegetable a day. o Try a new vegetable a month. o Use Pam! Stop using oil or butter to cook. o Don't finish your plate or use smaller plates. o Share your dessert. o Eat sugar free Jello for dessert or frozen grapes. o Don't eat 2-3 hours before bed. o Switch to whole wheat bread, pasta, and brown rice. o Make healthier choices when you eat out. No fries! o Pick baked chicken, NOT fried. o Don't forget to SLOW DOWN when you eat. It is not going anywhere.  o Take the stairs. o Park far away in the parking lot o Lift soup cans (or weights) for 10 minutes while  watching TV. o Walk at work for 10 minutes during break. o Walk outside 1 time a week with your friend, kids, dog, or significant other. o Start a walking group at church. o Walk the mall as much as you can tolerate.  o Keep a food diary. o Weigh yourself daily. o Walk for 15 minutes 3 days per week. o Cook at home more often and eat out less.  If life happens and you go back to old habits, it is okay.  Just start over. You can do it!   If you experience chest pain, get short of breath, or tired during the exercise, please stop immediately and inform your doctor.   

## 2013-07-31 LAB — HEPATIC FUNCTION PANEL
ALT: 36 U/L (ref 0–53)
AST: 24 U/L (ref 0–37)
Albumin: 3.8 g/dL (ref 3.5–5.2)
Alkaline Phosphatase: 44 U/L (ref 39–117)
Bilirubin, Direct: 0.1 mg/dL (ref 0.0–0.3)
Indirect Bilirubin: 0.4 mg/dL (ref 0.2–1.2)
Total Bilirubin: 0.5 mg/dL (ref 0.2–1.2)
Total Protein: 6.9 g/dL (ref 6.0–8.3)

## 2013-07-31 LAB — LIPID PANEL
Cholesterol: 195 mg/dL (ref 0–200)
HDL: 32 mg/dL — ABNORMAL LOW (ref >39)
LDL Cholesterol: 106 mg/dL — ABNORMAL HIGH (ref 0–99)
Total CHOL/HDL Ratio: 6.1 Ratio
Triglycerides: 283 mg/dL — ABNORMAL HIGH (ref <150)
VLDL: 57 mg/dL — ABNORMAL HIGH (ref 0–40)

## 2013-07-31 LAB — BASIC METABOLIC PANEL WITH GFR
BUN: 11 mg/dL (ref 6–23)
CO2: 26 mEq/L (ref 19–32)
Calcium: 9 mg/dL (ref 8.4–10.5)
Chloride: 102 mEq/L (ref 96–112)
Creat: 0.76 mg/dL (ref 0.50–1.35)
GFR, Est African American: >89 mL/min
GFR, Est Non African American: >89 mL/min
Glucose, Bld: 110 mg/dL — ABNORMAL HIGH (ref 70–99)
Potassium: 4 mEq/L (ref 3.5–5.3)
Sodium: 138 mEq/L (ref 135–145)

## 2013-07-31 LAB — TESTOSTERONE: Testosterone: 318 ng/dL (ref 300–890)

## 2013-07-31 LAB — INSULIN, FASTING: Insulin fasting, serum: 14 u[IU]/mL (ref 3–28)

## 2013-08-05 ENCOUNTER — Encounter: Payer: Self-pay | Admitting: Emergency Medicine

## 2013-10-03 ENCOUNTER — Other Ambulatory Visit: Payer: Self-pay | Admitting: *Deleted

## 2013-10-03 MED ORDER — HYDROCHLOROTHIAZIDE 25 MG PO TABS: 25.0000 mg | ORAL_TABLET | Freq: Every day | ORAL | Status: DC

## 2013-10-24 ENCOUNTER — Encounter: Payer: Self-pay | Admitting: Emergency Medicine

## 2013-10-24 ENCOUNTER — Ambulatory Visit (INDEPENDENT_AMBULATORY_CARE_PROVIDER_SITE_OTHER): Payer: Managed Care, Other (non HMO) | Admitting: Emergency Medicine

## 2013-10-24 VITALS — BP 140/64 | HR 62 | Temp 98.0°F | Resp 16 | Ht 71.5 in | Wt 268.0 lb

## 2013-10-24 DIAGNOSIS — J020 Streptococcal pharyngitis: Secondary | ICD-10-CM

## 2013-10-24 LAB — POCT RAPID STREP A (OFFICE): Rapid Strep A Screen: NEGATIVE

## 2013-10-24 MED ORDER — CEFTRIAXONE SODIUM 1 G IJ SOLR
1.0000 g | Freq: Once | INTRAMUSCULAR | Status: AC
  Administered 2013-10-24: 1 g via INTRAMUSCULAR

## 2013-10-24 MED ORDER — AZITHROMYCIN 250 MG PO TABS: ORAL_TABLET | ORAL | Status: AC

## 2013-10-24 NOTE — Patient Instructions (Signed)
Strep Throat  Warm salt water gargles daily. 1 tsp liquid benadryl + 1 tsp liquid Maalox, MIX/ GARGLE/ SPIT as needed for pain  Strep throat is an infection of the throat caused by a bacteria named Streptococcus pyogenes. Your caregiver may call the infection streptococcal "tonsillitis" or "pharyngitis" depending on whether there are signs of inflammation in the tonsils or back of the throat. Strep throat is most common in children aged 47-15 years during the cold months of the year, but it can occur in people of any age during any season. This infection is spread from person to person (contagious) through coughing, sneezing, or other close contact. SYMPTOMS   Fever or chills.  Painful, swollen, red tonsils or throat.  Pain or difficulty when swallowing.  White or yellow spots on the tonsils or throat.  Swollen, tender lymph nodes or "glands" of the neck or under the jaw.  Red rash all over the body (rare). DIAGNOSIS  Many different infections can cause the same symptoms. A test must be done to confirm the diagnosis so the right treatment can be given. A "rapid strep test" can help your caregiver make the diagnosis in a few minutes. If this test is not available, a light swab of the infected area can be used for a throat culture test. If a throat culture test is done, results are usually available in a day or two. TREATMENT  Strep throat is treated with antibiotic medicine. HOME CARE INSTRUCTIONS   Gargle with 1 tsp of salt in 1 cup of warm water, 3-4 times per day or as needed for comfort.  Family members who also have a sore throat or fever should be tested for strep throat and treated with antibiotics if they have the strep infection.  Make sure everyone in your household washes their hands well.  Do not share food, drinking cups, or personal items that could cause the infection to spread to others.  You may need to eat a soft food diet until your sore throat gets better.  Drink  enough water and fluids to keep your urine clear or pale yellow. This will help prevent dehydration.  Get plenty of rest.  Stay home from school, daycare, or work until you have been on antibiotics for 24 hours.  Only take over-the-counter or prescription medicines for pain, discomfort, or fever as directed by your caregiver.  If antibiotics are prescribed, take them as directed. Finish them even if you start to feel better. SEEK MEDICAL CARE IF:   The glands in your neck continue to enlarge.  You develop a rash, cough, or earache.  You cough up green, yellow-brown, or bloody sputum.  You have pain or discomfort not controlled by medicines.  Your problems seem to be getting worse rather than better. SEEK IMMEDIATE MEDICAL CARE IF:   You develop any new symptoms such as vomiting, severe headache, stiff or painful neck, chest pain, shortness of breath, or trouble swallowing.  You develop severe throat pain, drooling, or changes in your voice.  You develop swelling of the neck, or the skin on the neck becomes red and tender.  You have a fever.  You develop signs of dehydration, such as fatigue, dry mouth, and decreased urination.  You become increasingly sleepy, or you cannot wake up completely. Document Released: 04/01/2000 Document Revised: 03/21/2012 Document Reviewed: 06/03/2010 [facility] Patient Information 2015 [place], [place]. This information is not intended to replace advice given to you by your health care provider. Make sure you discuss  any questions you have with your health care provider.  

## 2013-10-24 NOTE — Progress Notes (Signed)
Subjective:    Patient ID: Nicholas Crosby, male    DOB: [birth year], 48 y.o.   MRN: [id number]  HPI Comments: 48 yo WM with sore throat increasing over last several days. He notes tonsils have become larger and have pus on them. He has not been taking any OTC except allergy medicine.     Medication List       This list is accurate as of: 10/24/13  2:22 PM.  Always use your most recent med list.               aspirin 81 MG tablet  Take 81 mg by mouth daily.     atenolol 100 MG tablet  Commonly known as:  TENORMIN  Take 50 mg by mouth daily. Takes 1 whole tab daily     buPROPion 300 MG 24 hr tablet  Commonly known as:  WELLBUTRIN XL  Take 300 mg by mouth daily.     clotrimazole-betamethasone cream  Commonly known as:  LOTRISONE  Apply 1 application topically 2 (two) times daily.     enalapril 10 MG tablet  Commonly known as:  VASOTEC  Take 10 mg by mouth daily.     hydrochlorothiazide 25 MG tablet  Commonly known as:  HYDRODIURIL  Take 1 tablet (25 mg total) by mouth daily.     pravastatin 20 MG tablet  Commonly known as:  PRAVACHOL  Take 20 mg by mouth daily.     testosterone cypionate 200 MG/ML injection  Commonly known as:  DEPOTESTOTERONE CYPIONATE  2cc q 2 weeks     valACYclovir 1000 MG tablet  Commonly known as:  VALTREX  1 tablet 3 x daily for cld sores / fever b;listers     verapamil 120 MG tablet  Commonly known as:  CALAN  Take 120 mg by mouth once.     Vitamin D3 5000 UNIT/ML Liqd  Take 5,000 Units by mouth daily.       Allergies  Allergen Reactions  . Zocor [Simvastatin] Other (See Comments)    cpk elev  . Crestor [Rosuvastatin] Palpitations   Past Medical History  Diagnosis Date  . Hypertension   . Hyperlipidemia   . Other testicular hypofunction   . Unspecified vitamin D deficiency   . Depression   . Obese 08/05/2013     Review of Systems  HENT: Positive for sore throat.   All other systems reviewed and are negative.  BP  140/64  Pulse 62  Temp(Src) 98 F (36.7 C) (Temporal)  Resp 16  Ht 5' 11.5" (1.816 m)  Wt 268 lb (121.564 kg)  BMI 36.86 kg/m2     Objective:   Physical Exam  Nursing note and vitals reviewed. Constitutional: He is oriented to person, place, and time. He appears well-developed and well-nourished.  HENT:  Head: Normocephalic and atraumatic.  Right Ear: External ear normal.  Left Ear: External ear normal.  Nose: Nose normal.  Mouth/Throat: Oropharynx is clear and moist. No oropharyngeal exudate.  2 + erythematous, nodular tonsils with brown/ yellow exudate  Eyes: Conjunctivae are normal.  Neck: Normal range of motion.  Cardiovascular: Normal rate, regular rhythm, normal heart sounds and intact distal pulses.   Pulmonary/Chest: Effort normal and breath sounds normal.  Abdominal: Soft.  Musculoskeletal: Normal range of motion.  Lymphadenopathy:    He has cervical adenopathy.  Neurological: He is alert and oriented to person, place, and time.  Skin: Skin is warm and dry.  Psychiatric: He has a normal  mood and affect. Judgment normal.          Assessment & Plan:  1. ? Strept throat- Rocephin 1 gm, If symptoms increase ZPAK or ER.  May need Referral ENT if no change. Warm salt water gargles daily. 1 tsp liquid benadryl + 1 tsp liquid Maalox, MIX/ GARGLE/ SPIT as needed for pain

## 2013-10-28 ENCOUNTER — Encounter: Payer: Self-pay | Admitting: Emergency Medicine

## 2013-10-28 ENCOUNTER — Ambulatory Visit (INDEPENDENT_AMBULATORY_CARE_PROVIDER_SITE_OTHER): Payer: Managed Care, Other (non HMO) | Admitting: Emergency Medicine

## 2013-10-28 VITALS — BP 162/98 | HR 74 | Temp 98.6°F | Resp 18 | Ht 71.5 in | Wt 271.0 lb

## 2013-10-28 DIAGNOSIS — Z113 Encounter for screening for infections with a predominantly sexual mode of transmission: Secondary | ICD-10-CM

## 2013-10-28 DIAGNOSIS — Z119 Encounter for screening for infectious and parasitic diseases, unspecified: Secondary | ICD-10-CM

## 2013-10-28 DIAGNOSIS — J029 Acute pharyngitis, unspecified: Secondary | ICD-10-CM

## 2013-10-28 LAB — CBC WITH DIFFERENTIAL/PLATELET
Basophils Absolute: 0 10*3/uL (ref 0.0–0.1)
Basophils Relative: 0 % (ref 0–1)
Eosinophils Absolute: 0.1 10*3/uL (ref 0.0–0.7)
Eosinophils Relative: 1 % (ref 0–5)
HCT: 43.4 % (ref 39.0–52.0)
Hemoglobin: 15.7 g/dL (ref 13.0–17.0)
Lymphocytes Relative: 27 % (ref 12–46)
Lymphs Abs: 2.5 10*3/uL (ref 0.7–4.0)
MCH: 28.6 pg (ref 26.0–34.0)
MCHC: 36.2 g/dL — ABNORMAL HIGH (ref 30.0–36.0)
MCV: 79.1 fL (ref 78.0–100.0)
Monocytes Absolute: 0.6 10*3/uL (ref 0.1–1.0)
Monocytes Relative: 6 % (ref 3–12)
Neutro Abs: 6.1 10*3/uL (ref 1.7–7.7)
Neutrophils Relative %: 66 % (ref 43–77)
Platelets: 309 10*3/uL (ref 150–400)
RBC: 5.49 MIL/uL (ref 4.22–5.81)
RDW: 13.9 % (ref 11.5–15.5)
WBC: 9.3 10*3/uL (ref 4.0–10.5)

## 2013-10-28 LAB — RPR

## 2013-10-28 NOTE — Progress Notes (Signed)
Subjective:    Patient ID: Nicholas Crosby, male    DOB: Nov 09, 1965, 48 y.o.   MRN: [id number]  HPI Comments: 48 yo WM with close f/u with sore throat. He was given Rocephin on 10/24/13 with some improvement. He did not start ZPAK AD. He notes still discomfort but overall feels better. He denies any signs/ concerns for STDs but wants to benchecked with the possibility of connection with throat infection.      Medication List       This list is accurate as of: 10/28/13 11:52 AM.  Always use your most recent med list.               aspirin 81 MG tablet  Take 81 mg by mouth daily.     atenolol 100 MG tablet  Commonly known as:  TENORMIN  Take 50 mg by mouth daily. Takes 1 whole tab daily     azithromycin 250 MG tablet  Commonly known as:  ZITHROMAX  Take 2 tablets (500 mg) on  Day 1,  followed by 1 tablet (250 mg) once daily on Days 2 through 5.     buPROPion 300 MG 24 hr tablet  Commonly known as:  WELLBUTRIN XL  Take 300 mg by mouth daily.     clotrimazole-betamethasone cream  Commonly known as:  LOTRISONE  Apply 1 application topically 2 (two) times daily.     enalapril 10 MG tablet  Commonly known as:  VASOTEC  Take 10 mg by mouth daily.     hydrochlorothiazide 25 MG tablet  Commonly known as:  HYDRODIURIL  Take 1 tablet (25 mg total) by mouth daily.     pravastatin 20 MG tablet  Commonly known as:  PRAVACHOL  Take 20 mg by mouth daily.     testosterone cypionate 200 MG/ML injection  Commonly known as:  DEPOTESTOTERONE CYPIONATE  2cc q 2 weeks     valACYclovir 1000 MG tablet  Commonly known as:  VALTREX  1 tablet 3 x daily for cld sores / fever b;listers     verapamil 120 MG tablet  Commonly known as:  CALAN  Take 120 mg by mouth once.     Vitamin D3 5000 UNIT/ML Liqd  Take 5,000 Units by mouth daily.       Allergies  Allergen Reactions  . Zocor [Simvastatin] Other (See Comments)    cpk elev  . Crestor [Rosuvastatin] Palpitations   Past Medical  History  Diagnosis Date  . Hypertension   . Hyperlipidemia   . Other testicular hypofunction   . Unspecified vitamin D deficiency   . Depression   . Obese 08/05/2013      Review of Systems  HENT: Positive for sore throat.   All other systems reviewed and are negative.  BP 162/98  Pulse 74  Temp(Src) 98.6 F (37 C) (Temporal)  Resp 18  Ht 5' 11.5" (1.816 m)  Wt 271 lb (122.925 kg)  BMI 37.27 kg/m2 RECHECK 154/88   Objective:   Physical Exam  Nursing note and vitals reviewed. Constitutional: He is oriented to person, place, and time. He appears well-developed and well-nourished.  HENT:  Head: Normocephalic and atraumatic.  Right Ear: External ear normal.  Left Ear: External ear normal.  Nose: Nose normal.  Mouth/Throat: No oropharyngeal exudate.  1+ nodular tonsils with improvement with exudate from last OV  Eyes: Conjunctivae are normal.  Neck: Normal range of motion.  Cardiovascular: Normal rate, regular rhythm, normal heart sounds and intact  distal pulses.   Pulmonary/Chest: Effort normal and breath sounds normal.  Abdominal: Soft. Bowel sounds are normal. He exhibits no distension. There is no tenderness.  Musculoskeletal: Normal range of motion.  Lymphadenopathy:    He has cervical adenopathy.  Neurological: He is alert and oriented to person, place, and time.  Skin: Skin is warm and dry.  Psychiatric: He has a normal mood and affect. Judgment normal.          Assessment & Plan:  1. Sore throat- Start ZPAK AD, if no change ref to ENT.  2. HTN- Check BP call if >130/80, increase cardio   3. Diarrhea with recent stress-Restora 1 qd or probiotic QD, call with results

## 2013-10-28 NOTE — Patient Instructions (Signed)

## 2013-10-29 LAB — HEPATITIS PANEL, ACUTE
HCV Ab: NEGATIVE
Hep A IgM: NONREACTIVE
Hep B C IgM: NONREACTIVE
Hepatitis B Surface Ag: NEGATIVE

## 2013-10-29 LAB — HIV ANTIBODY (ROUTINE TESTING W REFLEX): HIV 1&2 Ab, 4th Generation: NONREACTIVE

## 2013-10-29 LAB — GC PROBE AMPLIFICATION, URINE: GC Probe Amp, Urine: NEGATIVE

## 2013-10-30 LAB — CULTURE, GROUP A STREP: Organism ID, Bacteria: NORMAL

## 2013-10-31 ENCOUNTER — Encounter: Payer: Self-pay | Admitting: Emergency Medicine

## 2013-12-03 ENCOUNTER — Encounter: Payer: Self-pay | Admitting: Internal Medicine

## 2013-12-03 ENCOUNTER — Ambulatory Visit (INDEPENDENT_AMBULATORY_CARE_PROVIDER_SITE_OTHER): Payer: Managed Care, Other (non HMO) | Admitting: Internal Medicine

## 2013-12-03 VITALS — BP 114/80 | HR 60 | Temp 98.8°F | Resp 18 | Ht 72.0 in | Wt 276.2 lb

## 2013-12-03 DIAGNOSIS — E291 Testicular hypofunction: Secondary | ICD-10-CM

## 2013-12-03 DIAGNOSIS — R740 Nonspecific elevation of levels of transaminase and lactic acid dehydrogenase [LDH]: Secondary | ICD-10-CM

## 2013-12-03 DIAGNOSIS — Z0000 Encounter for general adult medical examination without abnormal findings: Secondary | ICD-10-CM

## 2013-12-03 DIAGNOSIS — I10 Essential (primary) hypertension: Secondary | ICD-10-CM

## 2013-12-03 DIAGNOSIS — R7402 Elevation of levels of lactic acid dehydrogenase (LDH): Secondary | ICD-10-CM

## 2013-12-03 DIAGNOSIS — Z125 Encounter for screening for malignant neoplasm of prostate: Secondary | ICD-10-CM

## 2013-12-03 DIAGNOSIS — Z1212 Encounter for screening for malignant neoplasm of rectum: Secondary | ICD-10-CM

## 2013-12-03 DIAGNOSIS — R7401 Elevation of levels of liver transaminase levels: Secondary | ICD-10-CM

## 2013-12-03 DIAGNOSIS — Z113 Encounter for screening for infections with a predominantly sexual mode of transmission: Secondary | ICD-10-CM

## 2013-12-03 DIAGNOSIS — E559 Vitamin D deficiency, unspecified: Secondary | ICD-10-CM

## 2013-12-03 DIAGNOSIS — Z79899 Other long term (current) drug therapy: Secondary | ICD-10-CM

## 2013-12-03 DIAGNOSIS — J029 Acute pharyngitis, unspecified: Secondary | ICD-10-CM

## 2013-12-03 DIAGNOSIS — R7309 Other abnormal glucose: Secondary | ICD-10-CM

## 2013-12-03 DIAGNOSIS — E782 Mixed hyperlipidemia: Secondary | ICD-10-CM

## 2013-12-03 LAB — CBC WITH DIFFERENTIAL/PLATELET
Basophils Absolute: 0 10*3/uL (ref 0.0–0.1)
Basophils Relative: 0 % (ref 0–1)
Eosinophils Absolute: 0.1 10*3/uL (ref 0.0–0.7)
Eosinophils Relative: 2 % (ref 0–5)
HCT: 41.9 % (ref 39.0–52.0)
Hemoglobin: 14.7 g/dL (ref 13.0–17.0)
Lymphocytes Relative: 30 % (ref 12–46)
Lymphs Abs: 2.2 10*3/uL (ref 0.7–4.0)
MCH: 28.2 pg (ref 26.0–34.0)
MCHC: 35.1 g/dL (ref 30.0–36.0)
MCV: 80.4 fL (ref 78.0–100.0)
Monocytes Absolute: 0.6 10*3/uL (ref 0.1–1.0)
Monocytes Relative: 9 % (ref 3–12)
Neutro Abs: 4.2 10*3/uL (ref 1.7–7.7)
Neutrophils Relative %: 59 % (ref 43–77)
Platelets: 312 10*3/uL (ref 150–400)
RBC: 5.21 MIL/uL (ref 4.22–5.81)
RDW: 14.5 % (ref 11.5–15.5)
WBC: 7.2 10*3/uL (ref 4.0–10.5)

## 2013-12-03 MED ORDER — PREDNISONE 10 MG PO TABS: ORAL_TABLET | ORAL | Status: AC

## 2013-12-03 MED ORDER — TESTOSTERONE CYPIONATE 200 MG/ML IM SOLN
400.0000 mg | Freq: Once | INTRAMUSCULAR | Status: AC
Start: 2013-12-03 — End: 2013-12-03
  Administered 2013-12-03: 400 mg via INTRAMUSCULAR

## 2013-12-03 NOTE — Progress Notes (Signed)
Patient ID: Nicholas Crosby, male   DOB: 09/23/1965, 48 y.o.   MRN: [id number]  Annual Screening Comprehensive Examination  This very nice 48 y.o.[name] presents for complete physical.  Patient has been followed for HTN, Morbid Obesity Prediabetes, Hyperlipidemia, Testostrone and Vitamin D Deficiency.   HTN predates since 23. Patient's BP has been controlled at home. Today's BP: 114/80 mmHg. Patient denies any cardiac symptoms as chest pain, palpitations, shortness of breath, dizziness or ankle swelling.   Patient's hyperlipidemia is controlled with diet and medications. Patient denies myalgias or other medication SE's. Last lipids were Cholesterol195; HDL  32*; LDL 106*; Triglycerides 283 on  07/30/2013.   Patient has Morbid Obesity (BMI 37.5) and consequent prediabetes since 2012 with an A1c 6.4% and he ha stried to manage this with diet. Patient denies reactive hypoglycemic symptoms, visual blurring, diabetic polys or paresthesias. Last A1c was 6.1% on 07/30/2013.   Patient also has hx/o Testosterone Deficiency since 2005 and has been on replacement therapy since.Finally, patient has history of Vitamin D Deficiency of 21 in 2008 and last vitamin D was 69 on  04/29/2013.  Medication Sig  . aspirin 81 MG tablet Take 81 mg by mouth daily.  Marland Kitchen atenolol (TENORMIN) 100 MG tablet Take 50 mg by mouth daily. Takes 1 whole tab daily  . buPROPion (WELLBUTRIN XL) 300 MG  Take 300 mg by mouth daily.  . Cholecalciferol (VITAMIN D3) 5000 U Take 5,000 Units by mouth daily.  . c (LOTRISONE) cream Apply 1 application topically 2 (two) times daily.  . enalapril (VASOTEC) 10 MG tablet Take 10 mg by mouth daily.  . Hydrochlorothiazide 25 MG tablet Take 1 tablet (25 mg total) by mouth daily.  . pravastatin  20 MG tablet Take 20 mg by mouth daily.  .  (DEPOTESTOTERONE  200 MG/ML  2cc q 2 weeks  . valACYclovir  1000 MG tablet 1 tablet 3 x daily for cld sores / fever b;listers  . verapamil  120 MG tablet Take 120 mg by  mouth once.    Allergies  Allergen Reactions  . Zocor [Simvastatin] Other (See Comments)    cpk elev  . Crestor [Rosuvastatin] Palpitations   Past Medical History  Diagnosis Date  . Hypertension   . Hyperlipidemia   . Other testicular hypofunction   . Unspecified vitamin D deficiency   . Depression   . Obese 08/05/2013   Past Surgical History  Procedure Laterality Date  . No past surgeries    . Vasectomy  04/06/2012    Procedure: VASECTOMY;  Surgeon: Alexis Frock, MD;  Location: [facility];  Service: Urology;  Laterality: Bilateral;  . Cosmetic surgery      liposuction  . Cyst removal neck     Family History  Problem Relation Age of Onset  . Hypertension Mother   . Hyperlipidemia Mother   . Diabetes Mother   . Cancer Mother     breast  . Heart disease Father   . Hyperlipidemia Father   . Hypertension Father    History   Social History  . Marital Status: Single    Spouse Name: N/A    Number of Children: N/A  . Years of Education: N/A   Occupational History  . Not on file.   Social History Main Topics  . Smoking status: Never Smoker   . Smokeless tobacco: Not on file  . Alcohol Use: 1.2 oz/week    2 Cans of beer per week  . Drug Use: No  .  Sexual Activity: Not on file   Other Topics Concern  . Not on file   Social History Narrative  . No narrative on file    ROS Constitutional: Denies fever, chills, weight loss/gain, headaches, insomnia, fatigue, night sweats or change in appetite. Eyes: Denies redness, blurred vision, diplopia, discharge, itchy or watery eyes.  ENT: Denies discharge, congestion, post nasal drip, epistaxis, sore throat, earache, hearing loss, dental pain, Tinnitus, Vertigo, Sinus pain or snoring.  Cardio: Denies chest pain, palpitations, irregular heartbeat, syncope, dyspnea, diaphoresis, orthopnea, PND, claudication or edema Respiratory: denies cough, dyspnea, DOE, pleurisy, hoarseness, laryngitis or wheezing.   Gastrointestinal: Denies dysphagia, heartburn, reflux, water brash, pain, cramps, nausea, vomiting, bloating, diarrhea, constipation, hematemesis, melena, hematochezia, jaundice or hemorrhoids Genitourinary: Denies dysuria, frequency, urgency, nocturia, hesitancy, discharge, hematuria or flank pain Musculoskeletal: Denies arthralgia, myalgia, stiffness, Jt. Swelling, pain, limp or strain/sprain. Denies Falls. Skin: Denies puritis, rash, hives, warts, acne, eczema or change in skin lesion Neuro: No weakness, tremor, incoordination, spasms, paresthesia or pain Psychiatric: Denies confusion, memory loss or sensory loss. Denies Depression. Endocrine: Denies change in weight, skin, hair change, nocturia, and paresthesia, diabetic polys, visual blurring or hyper / hypo glycemic episodes.  Heme/Lymph: No excessive bleeding, bruising or enlarged lymph nodes.  Physical Exam  BP 114/80  Pulse 60  Temp(Src) 98.8 F (37.1 C) (Temporal)  Resp 18  Ht 6' (1.829 m)  Wt 276 lb 3.2 oz (125.283 kg)  BMI 37.45 kg/m2  General Appearance: Well nourished, in no apparent distress. Eyes: PERRLA, EOMs, conjunctiva no swelling or erythema, normal fundi and vessels. Sinuses: No frontal/maxillary tenderness ENT/Mouth: EACs patent / TMs  nl. Nares clear without erythema, swelling, mucoid exudates. Oral hygiene is good. No erythema, swelling, or exudate. Tongue normal, non-obstructing. Tonsils not swollen or erythematous. Hearing normal.  Neck: Supple, thyroid normal. No bruits, nodes or JVD. Respiratory: Respiratory effort normal.  BS equal and clear bilateral without rales, rhonci, wheezing or stridor. Cardio: Heart sounds are normal with regular rate and rhythm and no murmurs, rubs or gallops. Peripheral pulses are normal and equal bilaterally without edema. No aortic or femoral bruits. Chest: symmetric with normal excursions and percussion.  Abdomen: Flat, soft, with bowl sounds. Nontender, no guarding, rebound,  hernias, masses, or organomegaly.  Lymphatics: Non tender without lymphadenopathy.  Genitourinary: No hernias.Testes nl. DRE - prostate nl for age - smooth & firm w/o nodules. Musculoskeletal: Full ROM all peripheral extremities, joint stability, 5/5 strength, and normal gait. Skin: Warm and dry without rashes, lesions, cyanosis, clubbing or  ecchymosis.  Neuro: Cranial nerves intact, reflexes equal bilaterally. Normal muscle tone, no cerebellar symptoms. Sensation intact.  Pysch: Awake and oriented X 3with normal affect, insight and judgment appropriate.   Assessment and Plan  1. Annual Screening Examination 2. Hypertension  3. Hyperlipidemia 4. Pre Diabetes 5. Vitamin D Deficiency 6. Morbid Obesity (BMI 37.5)  Continue prudent diet as discussed, weight control, BP monitoring, regular exercise, and medications as discussed.  Discussed med effects and SE's. Routine screening labs and tests as requested with regular follow-up as recommended.

## 2013-12-03 NOTE — Patient Instructions (Signed)
 Recommend the book "The END of DIETING" by Dr Joel Furman   and the book "The END of DIABETES " by Dr Joel Fuhrman  At [employer] - get book & Audio CD's      Being diabetic has a  300% increased risk for heart attack, stroke, cancer, and alzheimer- type vascular dementia. It is very important that you work harder with diet by avoiding all foods that are white except chicken & fish. Avoid white rice (brown & wild rice is OK), white potatoes (sweetpotatoes in moderation is OK), White bread or wheat bread or anything made out of white flour like bagels, donuts, rolls, buns, biscuits, cakes, pastries, cookies, pizza crust, and pasta (made from white flour & egg whites) - vegetarian pasta or spinach or wheat pasta is OK. Multigrain breads like Arnold's or [employer], or multigrain sandwich thins or flatbreads.  Diet, exercise and weight loss can reverse and cure diabetes in the early stages.  Diet, exercise and weight loss is very important in the control and prevention of complications of diabetes which affects every system in your body, ie. Brain - dementia/stroke, eyes - glaucoma/blindness, heart - heart attack/heart failure, kidneys - dialysis, stomach - gastric paralysis, intestines - malabsorption, nerves - severe painful neuritis, circulation - gangrene & loss of a leg(s), and finally cancer and Alzheimers.    I recommend avoid fried & greasy foods,  sweets/candy, white rice (brown or wild rice or Quinoa is OK), white potatoes (sweet potatoes are OK) - anything made from white flour - bagels, doughnuts, rolls, buns, biscuits,white and wheat breads, pizza crust and traditional pasta made of white flour & egg white(vegetarian pasta or spinach or wheat pasta is OK).  Multi-grain bread is OK - like multi-grain flat bread or sandwich thins. Avoid alcohol in excess. Exercise is also important.    Eat all the vegetables you want - avoid meat, especially red meat and dairy - especially cheese.  Cheese  is the most concentrated form of trans-fats which is the worst thing to clog up our arteries. Veggie cheese is OK which can be found in the fresh produce section at [employer] or [employer] or [employer]  Preventive Care for Adults A healthy lifestyle and preventive care can promote health and wellness. Preventive health guidelines for men include the following key practices:  A routine yearly physical is a good way to check with your health care provider about your health and preventative screening. It is a chance to share any concerns and updates on your health and to receive a thorough exam.  Visit your dentist for a routine exam and preventative care every 6 months. Brush your teeth twice a day and floss once a day. Good oral hygiene prevents tooth decay and gum disease.  The frequency of eye exams is based on your age, health, family medical history, use of contact lenses, and other factors. Follow your health care provider's recommendations for frequency of eye exams.  Eat a healthy diet. Foods such as vegetables, fruits, whole grains, low-fat dairy products, and lean protein foods contain the nutrients you need without too many calories. Decrease your intake of foods high in solid fats, added sugars, and salt. Eat the right amount of calories for you.Get information about a proper diet from your health care provider, if necessary.  Regular physical exercise is one of the most important things you can do for your health. Most adults should get at least 150 minutes of moderate-intensity exercise (any activity that   increases your heart rate and causes you to sweat) each week. In addition, most adults need muscle-strengthening exercises on 2 or more days a week.  Maintain a healthy weight. The body mass index (BMI) is a screening tool to identify possible weight problems. It provides an estimate of body fat based on height and weight. Your health care provider can find your BMI and can help you  achieve or maintain a healthy weight.For adults 20 years and older:  A BMI below 18.5 is considered underweight.  A BMI of 18.5 to 24.9 is normal.  A BMI of 25 to 29.9 is considered overweight.  A BMI of 30 and above is considered obese.  Maintain normal blood lipids and cholesterol levels by exercising and minimizing your intake of saturated fat. Eat a balanced diet with plenty of fruit and vegetables. Blood tests for lipids and cholesterol should begin at age 20 and be repeated every 5 years. If your lipid or cholesterol levels are high, you are over 50, or you are at high risk for heart disease, you may need your cholesterol levels checked more frequently.Ongoing high lipid and cholesterol levels should be treated with medicines if diet and exercise are not working.  If you smoke, find out from your health care provider how to quit. If you do not use tobacco, do not start.  Lung cancer screening is recommended for adults aged 55-80 years who are at high risk for developing lung cancer because of a history of smoking. A yearly low-dose CT scan of the lungs is recommended for people who have at least a 30-pack-year history of smoking and are a current smoker or have quit within the past 15 years. A pack year of smoking is smoking an average of 1 pack of cigarettes a day for 1 year (for example: 1 pack a day for 30 years or 2 packs a day for 15 years). Yearly screening should continue until the smoker has stopped smoking for at least 15 years. Yearly screening should be stopped for people who develop a health problem that would prevent them from having lung cancer treatment.  If you choose to drink alcohol, do not have more than 2 drinks per day. One drink is considered to be 12 ounces (355 mL) of beer, 5 ounces (148 mL) of wine, or 1.5 ounces (44 mL) of liquor.  Avoid use of street drugs. Do not share needles with anyone. Ask for help if you need support or instructions about stopping the use of  drugs.  High blood pressure causes heart disease and increases the risk of stroke. Your blood pressure should be checked at least every 1-2 years. Ongoing high blood pressure should be treated with medicines, if weight loss and exercise are not effective.  If you are 45-79 years old, ask your health care provider if you should take aspirin to prevent heart disease.  Diabetes screening involves taking a blood sample to check your fasting blood sugar level. This should be done once every 3 years, after age 45, if you are within normal weight and without risk factors for diabetes. Testing should be considered at a younger age or be carried out more frequently if you are overweight and have at least 1 risk factor for diabetes.  Colorectal cancer can be detected and often prevented. Most routine colorectal cancer screening begins at the age of 50 and continues through age 75. However, your health care provider may recommend screening at an earlier age if you have risk   factors for colon cancer. On a yearly basis, your health care provider may provide home test kits to check for hidden blood in the stool. Use of a small camera at the end of a tube to directly examine the colon (sigmoidoscopy or colonoscopy) can detect the earliest forms of colorectal cancer. Talk to your health care provider about this at age 50, when routine screening begins. Direct exam of the colon should be repeated every 5-10 years through age 75, unless early forms of precancerous polyps or small growths are found.  People who are at an increased risk for hepatitis B should be screened for this virus. You are considered at high risk for hepatitis B if:  You were born in a country where hepatitis B occurs often. Talk with your health care provider about which countries are considered high risk.  Your parents were born in a high-risk country and you have not received a shot to protect against hepatitis B (hepatitis B vaccine).  You have  HIV or AIDS.  You use needles to inject street drugs.  You live with, or have sex with, someone who has hepatitis B.  You are a man who has sex with other men (MSM).  You get hemodialysis treatment.  You take certain medicines for conditions such as cancer, organ transplantation, and autoimmune conditions.  Hepatitis C blood testing is recommended for all people born from 1945 through 1965 and any individual with known risks for hepatitis C.  Practice safe sex. Use condoms and avoid high-risk sexual practices to reduce the spread of sexually transmitted infections (STIs). STIs include gonorrhea, chlamydia, syphilis, trichomonas, herpes, HPV, and human immunodeficiency virus (HIV). Herpes, HIV, and HPV are viral illnesses that have no cure. They can result in disability, cancer, and death.  If you are at risk of being infected with HIV, it is recommended that you take a prescription medicine daily to prevent HIV infection. This is called preexposure prophylaxis (PrEP). You are considered at risk if:  You are a man who has sex with other men (MSM) and have other risk factors.  You are a heterosexual man, are sexually active, and are at increased risk for HIV infection.  You take drugs by injection.  You are sexually active with a partner who has HIV.  Talk with your health care provider about whether you are at high risk of being infected with HIV. If you choose to begin PrEP, you should first be tested for HIV. You should then be tested every 3 months for as long as you are taking PrEP.  A one-time screening for abdominal aortic aneurysm (AAA) and surgical repair of large AAAs by ultrasound are recommended for men ages 65 to 75 years who are current or former smokers.  Healthy men should no longer receive prostate-specific antigen (PSA) blood tests as part of routine cancer screening. Talk with your health care provider about prostate cancer screening.  Testicular cancer screening is  not recommended for adult males who have no symptoms. Screening includes self-exam, a health care provider exam, and other screening tests. Consult with your health care provider about any symptoms you have or any concerns you have about testicular cancer.  Use sunscreen. Apply sunscreen liberally and repeatedly throughout the day. You should seek shade when your shadow is shorter than you. Protect yourself by wearing long sleeves, pants, a wide-brimmed hat, and sunglasses year round, whenever you are outdoors.  Once a month, do a whole-body skin exam, using a mirror to look   at the skin on your back. Tell your health care provider about new moles, moles that have irregular borders, moles that are larger than a pencil eraser, or moles that have changed in shape or color.  Stay current with required vaccines (immunizations).  Influenza vaccine. All adults should be immunized every year.  Tetanus, diphtheria, and acellular pertussis (Td, Tdap) vaccine. An adult who has not previously received Tdap or who does not know his vaccine status should receive 1 dose of Tdap. This initial dose should be followed by tetanus and diphtheria toxoids (Td) booster doses every 10 years. Adults with an unknown or incomplete history of completing a 3-dose immunization series with Td-containing vaccines should begin or complete a primary immunization series including a Tdap dose. Adults should receive a Td booster every 10 years.  Varicella vaccine. An adult without evidence of immunity to varicella should receive 2 doses or a second dose if he has previously received 1 dose.  Human papillomavirus (HPV) vaccine. Males aged 13-21 years who have not received the vaccine previously should receive the 3-dose series. Males aged 22-26 years may be immunized. Immunization is recommended through the age of 26 years for any male who has sex with males and did not get any or all doses earlier. Immunization is recommended for any  person with an immunocompromised condition through the age of 26 years if he did not get any or all doses earlier. During the 3-dose series, the second dose should be obtained 4-8 weeks after the first dose. The third dose should be obtained 24 weeks after the first dose and 16 weeks after the second dose.  Zoster vaccine. One dose is recommended for adults aged 60 years or older unless certain conditions are present.  Measles, mumps, and rubella (MMR) vaccine. Adults born before 1957 generally are considered immune to measles and mumps. Adults born in 1957 or later should have 1 or more doses of MMR vaccine unless there is a contraindication to the vaccine or there is laboratory evidence of immunity to each of the three diseases. A routine second dose of MMR vaccine should be obtained at least 28 days after the first dose for students attending postsecondary schools, health care workers, or international travelers. People who received inactivated measles vaccine or an unknown type of measles vaccine during 1963-1967 should receive 2 doses of MMR vaccine. People who received inactivated mumps vaccine or an unknown type of mumps vaccine before 1979 and are at high risk for mumps infection should consider immunization with 2 doses of MMR vaccine. Unvaccinated health care workers born before 1957 who lack laboratory evidence of measles, mumps, or rubella immunity or laboratory confirmation of disease should consider measles and mumps immunization with 2 doses of MMR vaccine or rubella immunization with 1 dose of MMR vaccine.  Pneumococcal 13-valent conjugate (PCV13) vaccine. When indicated, a person who is uncertain of his immunization history and has no record of immunization should receive the PCV13 vaccine. An adult aged 19 years or older who has certain medical conditions and has not been previously immunized should receive 1 dose of PCV13 vaccine. This PCV13 should be followed with a dose of pneumococcal  polysaccharide (PPSV23) vaccine. The PPSV23 vaccine dose should be obtained at least 8 weeks after the dose of PCV13 vaccine. An adult aged 19 years or older who has certain medical conditions and previously received 1 or more doses of PPSV23 vaccine should receive 1 dose of PCV13. The PCV13 vaccine dose should be obtained 1   or more years after the last PPSV23 vaccine dose.  Pneumococcal polysaccharide (PPSV23) vaccine. When PCV13 is also indicated, PCV13 should be obtained first. All adults aged 65 years and older should be immunized. An adult younger than age 65 years who has certain medical conditions should be immunized. Any person who resides in a nursing home or long-term care facility should be immunized. An adult smoker should be immunized. People with an immunocompromised condition and certain other conditions should receive both PCV13 and PPSV23 vaccines. People with human immunodeficiency virus (HIV) infection should be immunized as soon as possible after diagnosis. Immunization during chemotherapy or radiation therapy should be avoided. Routine use of PPSV23 vaccine is not recommended for American Indians, [place] Natives, or people younger than 65 years unless there are medical conditions that require PPSV23 vaccine. When indicated, people who have unknown immunization and have no record of immunization should receive PPSV23 vaccine. One-time revaccination 5 years after the first dose of PPSV23 is recommended for people aged 19-64 years who have chronic kidney failure, nephrotic syndrome, asplenia, or immunocompromised conditions. People who received 1-2 doses of PPSV23 before age 65 years should receive another dose of PPSV23 vaccine at age 65 years or later if at least 5 years have passed since the previous dose. Doses of PPSV23 are not needed for people immunized with PPSV23 at or after age 65 years.  Meningococcal vaccine. Adults with asplenia or persistent complement component deficiencies  should receive 2 doses of quadrivalent meningococcal conjugate (MenACWY-D) vaccine. The doses should be obtained at least 2 months apart. Microbiologists working with certain meningococcal bacteria, military recruits, people at risk during an outbreak, and people who travel to or live in countries with a high rate of meningitis should be immunized. A first-year college student up through age 21 years who is living in a residence hall should receive a dose if he did not receive a dose on or after his 16th birthday. Adults who have certain high-risk conditions should receive one or more doses of vaccine.  Hepatitis A vaccine. Adults who wish to be protected from this disease, have certain high-risk conditions, work with hepatitis A-infected animals, work in hepatitis A research labs, or travel to or work in countries with a high rate of hepatitis A should be immunized. Adults who were previously unvaccinated and who anticipate close contact with an international adoptee during the first 60 days after arrival in the [place] from a country with a high rate of hepatitis A should be immunized.  Hepatitis B vaccine. Adults should be immunized if they wish to be protected from this disease, have certain high-risk conditions, may be exposed to blood or other infectious body fluids, are household contacts or sex partners of hepatitis B positive people, are clients or workers in certain care facilities, or travel to or work in countries with a high rate of hepatitis B.  Haemophilus influenzae type b (Hib) vaccine. A previously unvaccinated person with asplenia or sickle cell disease or having a scheduled splenectomy should receive 1 dose of Hib vaccine. Regardless of previous immunization, a recipient of a hematopoietic stem cell transplant should receive a 3-dose series 6-12 months after his successful transplant. Hib vaccine is not recommended for adults with HIV infection. Preventive Service / Frequency Ages  40 to 64  Blood pressure check.** / Every 1 to 2 years.  Lipid and cholesterol check.** / Every 5 years beginning at age 20.  Lung cancer screening. / Every year if you are aged 55-80   55-80 years and have a 30-pack-year history of smoking and currently smoke or have quit within the past 15 years. Yearly screening is stopped once you have quit smoking for at least 15 years or develop a health problem that would prevent you from having lung cancer treatment.  Fecal occult blood test (FOBT) of stool. / Every year beginning at age 50 and continuing until age 13. You may not have to do this test if you get a colonoscopy every 10 years.  Flexible sigmoidoscopy** or colonoscopy.** / Every 5 years for a flexible sigmoidoscopy or every 10 years for a colonoscopy beginning at age 62 and continuing until age 83.  Hepatitis C blood test.** / For all people born from [birth year] through 1965 and any individual with known risks for hepatitis C.  Skin self-exam. / Monthly.  Influenza vaccine. / Every year.  Tetanus, diphtheria, and acellular pertussis (Tdap/Td) vaccine.** / Consult your health care provider. 1 dose of Td every 10 years.  Varicella vaccine.** / Consult your health care provider.  Zoster vaccine.** / 1 dose for adults aged 24 years or older.  Measles, mumps, rubella (MMR) vaccine.** / You need at least 1 dose of MMR if you were born in 1957 or later. You may also need a second dose.  Pneumococcal 13-valent conjugate (PCV13) vaccine.** / Consult your health care provider.  Pneumococcal polysaccharide (PPSV23) vaccine.** / 1 to 2 doses if you smoke cigarettes or if you have certain conditions.  Meningococcal vaccine.** / Consult your health care provider.  Hepatitis A vaccine.** / Consult your health care provider.  Hepatitis B vaccine.** / Consult your health care provider.  Haemophilus influenzae type b (Hib) vaccine.** / Consult your health care provider.

## 2013-12-04 LAB — URINALYSIS, MICROSCOPIC ONLY
Bacteria, UA: NONE SEEN
Casts: NONE SEEN
Crystals: NONE SEEN
Squamous Epithelial / LPF: NONE SEEN

## 2013-12-04 LAB — VITAMIN D 25 HYDROXY (VIT D DEFICIENCY, FRACTURES): Vit D, 25-Hydroxy: 57 ng/mL (ref 30–89)

## 2013-12-04 LAB — BASIC METABOLIC PANEL WITH GFR
BUN: 16 mg/dL (ref 6–23)
CO2: 32 mEq/L (ref 19–32)
Calcium: 9.4 mg/dL (ref 8.4–10.5)
Chloride: 100 mEq/L (ref 96–112)
Creat: 0.81 mg/dL (ref 0.50–1.35)
GFR, Est African American: >89 mL/min
GFR, Est Non African American: >89 mL/min
Glucose, Bld: 92 mg/dL (ref 70–99)
Potassium: 3.9 mEq/L (ref 3.5–5.3)
Sodium: 138 mEq/L (ref 135–145)

## 2013-12-04 LAB — HEPATIC FUNCTION PANEL
ALT: 33 U/L (ref 0–53)
AST: 21 U/L (ref 0–37)
Albumin: 4.1 g/dL (ref 3.5–5.2)
Alkaline Phosphatase: 41 U/L (ref 39–117)
Bilirubin, Direct: 0.1 mg/dL (ref 0.0–0.3)
Indirect Bilirubin: 0.3 mg/dL (ref 0.2–1.2)
Total Bilirubin: 0.4 mg/dL (ref 0.2–1.2)
Total Protein: 6.8 g/dL (ref 6.0–8.3)

## 2013-12-04 LAB — LIPID PANEL
Cholesterol: 190 mg/dL (ref 0–200)
HDL: 40 mg/dL (ref >39)
LDL Cholesterol: 101 mg/dL — ABNORMAL HIGH (ref 0–99)
Total CHOL/HDL Ratio: 4.8 Ratio
Triglycerides: 247 mg/dL — ABNORMAL HIGH (ref <150)
VLDL: 49 mg/dL — ABNORMAL HIGH (ref 0–40)

## 2013-12-04 LAB — RPR

## 2013-12-04 LAB — HEPATITIS C ANTIBODY: HCV Ab: NEGATIVE

## 2013-12-04 LAB — MICROALBUMIN / CREATININE URINE RATIO
Creatinine, Urine: 110.7 mg/dL
Microalb Creat Ratio: 4.5 mg/g (ref 0.0–30.0)
Microalb, Ur: 0.5 mg/dL (ref 0.00–1.89)

## 2013-12-04 LAB — HEPATITIS A ANTIBODY, TOTAL: Hep A Total Ab: REACTIVE — AB

## 2013-12-04 LAB — TESTOSTERONE: Testosterone: 141 ng/dL — ABNORMAL LOW (ref 300–890)

## 2013-12-04 LAB — HEMOGLOBIN A1C
Hgb A1c MFr Bld: 6.3 % — ABNORMAL HIGH (ref <5.7)
Mean Plasma Glucose: 134 mg/dL — ABNORMAL HIGH (ref <117)

## 2013-12-04 LAB — HEPATITIS B CORE ANTIBODY, TOTAL: Hep B Core Total Ab: NONREACTIVE

## 2013-12-04 LAB — MAGNESIUM: Magnesium: 1.8 mg/dL (ref 1.5–2.5)

## 2013-12-04 LAB — HEPATITIS B SURFACE ANTIBODY,QUALITATIVE: Hep B S Ab: POSITIVE — AB

## 2013-12-04 LAB — VITAMIN B12: Vitamin B-12: 464 pg/mL (ref 211–911)

## 2013-12-04 LAB — HIV ANTIBODY (ROUTINE TESTING W REFLEX): HIV 1&2 Ab, 4th Generation: NONREACTIVE

## 2013-12-04 LAB — PSA: PSA: 0.43 ng/mL (ref <=4.00)

## 2013-12-04 LAB — TSH: TSH: 1.39 u[IU]/mL (ref 0.350–4.500)

## 2013-12-04 LAB — INSULIN, FASTING: Insulin fasting, serum: 95 u[IU]/mL — ABNORMAL HIGH (ref 3–28)

## 2013-12-05 LAB — HEPATITIS B E ANTIBODY: Hepatitis Be Antibody: NONREACTIVE

## 2013-12-08 ENCOUNTER — Other Ambulatory Visit: Payer: Self-pay | Admitting: Internal Medicine

## 2013-12-08 DIAGNOSIS — E782 Mixed hyperlipidemia: Secondary | ICD-10-CM

## 2013-12-08 DIAGNOSIS — E119 Type 2 diabetes mellitus without complications: Secondary | ICD-10-CM

## 2013-12-08 MED ORDER — METFORMIN HCL ER 500 MG PO TB24: ORAL_TABLET | ORAL | Status: DC

## 2013-12-08 MED ORDER — PRAVASTATIN SODIUM 20 MG PO TABS: 20.0000 mg | ORAL_TABLET | Freq: Every day | ORAL | Status: DC

## 2013-12-11 ENCOUNTER — Encounter: Payer: Self-pay | Admitting: Internal Medicine

## 2013-12-25 ENCOUNTER — Other Ambulatory Visit (INDEPENDENT_AMBULATORY_CARE_PROVIDER_SITE_OTHER): Payer: Managed Care, Other (non HMO) | Admitting: *Deleted

## 2013-12-25 DIAGNOSIS — Z1212 Encounter for screening for malignant neoplasm of rectum: Secondary | ICD-10-CM

## 2013-12-25 LAB — POC HEMOCCULT BLD/STL (HOME/3-CARD/SCREEN)
Card #2 Fecal Occult Blod, POC: NEGATIVE
Card #3 Fecal Occult Blood, POC: NEGATIVE
Fecal Occult Blood, POC: NEGATIVE

## 2014-01-07 ENCOUNTER — Ambulatory Visit (INDEPENDENT_AMBULATORY_CARE_PROVIDER_SITE_OTHER): Payer: Managed Care, Other (non HMO) | Admitting: Internal Medicine

## 2014-01-07 ENCOUNTER — Encounter: Payer: Self-pay | Admitting: Internal Medicine

## 2014-01-07 VITALS — BP 140/94 | HR 52 | Temp 98.1°F | Resp 16 | Ht 72.0 in | Wt 269.8 lb

## 2014-01-07 DIAGNOSIS — B351 Tinea unguium: Secondary | ICD-10-CM

## 2014-01-07 DIAGNOSIS — Z79899 Other long term (current) drug therapy: Secondary | ICD-10-CM

## 2014-01-07 DIAGNOSIS — E291 Testicular hypofunction: Secondary | ICD-10-CM

## 2014-01-07 DIAGNOSIS — J029 Acute pharyngitis, unspecified: Secondary | ICD-10-CM

## 2014-01-07 MED ORDER — TESTOSTERONE CYPIONATE 200 MG/ML IM SOLN
400.0000 mg | Freq: Once | INTRAMUSCULAR | Status: AC
  Administered 2014-01-07: 400 mg via INTRAMUSCULAR

## 2014-01-07 MED ORDER — PREDNISONE 20 MG PO TABS: ORAL_TABLET | ORAL | Status: AC

## 2014-01-07 NOTE — Progress Notes (Signed)
Subjective:    Patient ID: Nicholas Crosby, male    DOB: 1965-07-10, 48 y.o.   MRN: [id number]  HPI Patient presents with a recurrent ST relapsing after recent treatment x2 in July  With Abx's and Steroids. Sx's now are intermittent and waxing & waning discomfort in his peritonsillar areas. No fever, chill, rashes, sweats or congestion.    Medication List       This list is accurate as of: 01/07/14  9:58 PM.  Always use your most recent med list.               aspirin 81 MG tablet  Take 81 mg by mouth daily.     atenolol 100 MG tablet  Commonly known as:  TENORMIN  Take 100 mg by mouth daily. Takes 1 whole tab daily     buPROPion 300 MG 24 hr tablet  Commonly known as:  WELLBUTRIN XL  Take 300 mg by mouth daily.     clotrimazole-betamethasone cream  Commonly known as:  LOTRISONE  Apply 1 application topically 2 (two) times daily.     enalapril 10 MG tablet  Commonly known as:  VASOTEC  Take 10 mg by mouth daily.     hydrochlorothiazide 25 MG tablet  Commonly known as:  HYDRODIURIL  Take 1 tablet (25 mg total) by mouth daily.     metFORMIN 500 MG 24 hr tablet  Commonly known as:  GLUCOPHAGE XR  Take 1 tablet 2 x/day with Bkfst & Lunch and 2 tablets with supper for Diabetes     pravastatin 20 MG tablet  Commonly known as:  PRAVACHOL  Take 1 tablet (20 mg total) by mouth daily.     predniSONE 20 MG tablet  Commonly known as:  DELTASONE  Take 1 tablet 3 x day x 3 days - then 2 x da x 3 days- then 1 x day x 5 days - the  Every other day     terbinafine 250 MG tablet  Commonly known as:  LAMISIL     testosterone cypionate 200 MG/ML injection  Commonly known as:  DEPOTESTOTERONE CYPIONATE  2cc q 2 weeks     valACYclovir 1000 MG tablet  Commonly known as:  VALTREX  1 tablet 3 x daily for cld sores / fever b;listers     verapamil 120 MG tablet  Commonly known as:  CALAN  Take 120 mg by mouth once.     Vitamin D3 5000 UNIT/ML Liqd  Take 5,000 Units by mouth  daily.       Allergies  Allergen Reactions  . Zocor [Simvastatin] Other (See Comments)    cpk elev  . Crestor [Rosuvastatin] Palpitations    Past Medical History  Diagnosis Date  . Hypertension   . Hyperlipidemia   . Other testicular hypofunction   . Unspecified vitamin D deficiency   . Depression   . Obese 08/05/2013   Review of Systems In addition to the HPI above,  No Fever-chills,  No Headache, No changes with Vision or hearing,  Altho sore, No problems swallowing food or Liquids,  No Chest pain or productive Cough or Shortness of Breath,  No Abdominal pain, No Nausea or Vomitting, Bowel movements are regular,  No dysuria,  No new skin rashes or bruises,  No new joints pains-aches,  No new weakness, tingling, numbness in any extremity,  No polyuria, polydypsia or polyphagia,   Objective:   Physical Exam BP 140/94  Pulse 52  Temp(Src) 98.1 F (  36.7 C) (Temporal)  Resp 16  Ht 6' (1.829 m)  Wt 269 lb 12.8 oz (122.38 kg)  BMI 36.58 kg/m2  HEENT - Eac's patent. TM's Nl. EOM's full. PERRLA. NasoOroPharynx 1 (+) injected ai the peritonsillar areas and soft palate/uvula w/o exudates or ulcerations.. Neck - supple. Nl Thyroid. Carotids 2+ & No bruits, nodes, JVD Chest - Clear equal BS w/o Rales, rhonchi, wheezes. Cor - Nl HS. RRR w/o sig MGR. PP 1(+).  MS- FROM w/o deformities. Muscle power, tone and bulk Nl. Gait Nl. Neuro - No obvious Cr N abnormalities. Sensory, motor and Cerebellar functions appear Nl w/o focal abnormalities. Skin/nails - show proximal 1/3 of toenails appearing normal.  Assessment & Plan:   1. Acute pharyngitis, suspect viral  - Treat with slow steroid taper  2. Dermatophytosis of nail  - continue Lamisil  3. Encounter for long-term (current) use of other medications  - Hepatic function panel  4. Other testicular hypofunction  - testosterone cypionate (DEPOTESTOTERONE CYPIONATE) injection 400 mg; Inject 2 mLs (400 mg total) into the  muscle once.

## 2014-01-08 LAB — HEPATIC FUNCTION PANEL
ALT: 24 U/L (ref 0–53)
AST: 19 U/L (ref 0–37)
Albumin: 4.5 g/dL (ref 3.5–5.2)
Alkaline Phosphatase: 41 U/L (ref 39–117)
Bilirubin, Direct: 0.1 mg/dL (ref 0.0–0.3)
Indirect Bilirubin: 0.6 mg/dL (ref 0.2–1.2)
Total Bilirubin: 0.7 mg/dL (ref 0.2–1.2)
Total Protein: 7.5 g/dL (ref 6.0–8.3)

## 2014-01-13 ENCOUNTER — Emergency Department (HOSPITAL_BASED_OUTPATIENT_CLINIC_OR_DEPARTMENT_OTHER)
Admission: EM | Admit: 2014-01-13 | Discharge: 2014-01-13 | Disposition: A | Discharge status: Home / Self Care | Payer: Managed Care, Other (non HMO) | Attending: Emergency Medicine | Admitting: Emergency Medicine

## 2014-01-13 ENCOUNTER — Other Ambulatory Visit: Payer: Self-pay

## 2014-01-13 ENCOUNTER — Emergency Department (HOSPITAL_BASED_OUTPATIENT_CLINIC_OR_DEPARTMENT_OTHER): Payer: Managed Care, Other (non HMO)

## 2014-01-13 ENCOUNTER — Encounter (HOSPITAL_BASED_OUTPATIENT_CLINIC_OR_DEPARTMENT_OTHER): Payer: Self-pay | Admitting: Emergency Medicine

## 2014-01-13 DIAGNOSIS — E559 Vitamin D deficiency, unspecified: Secondary | ICD-10-CM | POA: Insufficient documentation

## 2014-01-13 DIAGNOSIS — Z79899 Other long term (current) drug therapy: Secondary | ICD-10-CM | POA: Insufficient documentation

## 2014-01-13 DIAGNOSIS — E785 Hyperlipidemia, unspecified: Secondary | ICD-10-CM | POA: Diagnosis not present

## 2014-01-13 DIAGNOSIS — I10 Essential (primary) hypertension: Secondary | ICD-10-CM | POA: Insufficient documentation

## 2014-01-13 DIAGNOSIS — Z7982 Long term (current) use of aspirin: Secondary | ICD-10-CM | POA: Insufficient documentation

## 2014-01-13 DIAGNOSIS — E669 Obesity, unspecified: Secondary | ICD-10-CM | POA: Diagnosis not present

## 2014-01-13 DIAGNOSIS — IMO0002 Reserved for concepts with insufficient information to code with codable children: Secondary | ICD-10-CM | POA: Diagnosis not present

## 2014-01-13 DIAGNOSIS — R1110 Vomiting, unspecified: Secondary | ICD-10-CM | POA: Diagnosis not present

## 2014-01-13 DIAGNOSIS — F329 Major depressive disorder, single episode, unspecified: Secondary | ICD-10-CM | POA: Diagnosis not present

## 2014-01-13 DIAGNOSIS — R5383 Other fatigue: Secondary | ICD-10-CM | POA: Insufficient documentation

## 2014-01-13 DIAGNOSIS — R55 Syncope and collapse: Secondary | ICD-10-CM | POA: Insufficient documentation

## 2014-01-13 DIAGNOSIS — R5381 Other malaise: Secondary | ICD-10-CM | POA: Diagnosis present

## 2014-01-13 DIAGNOSIS — R197 Diarrhea, unspecified: Secondary | ICD-10-CM | POA: Diagnosis not present

## 2014-01-13 DIAGNOSIS — E291 Testicular hypofunction: Secondary | ICD-10-CM | POA: Diagnosis not present

## 2014-01-13 DIAGNOSIS — F3289 Other specified depressive episodes: Secondary | ICD-10-CM | POA: Diagnosis not present

## 2014-01-13 LAB — COMPREHENSIVE METABOLIC PANEL
ALT: 17 U/L (ref 0–53)
AST: 14 U/L (ref 0–37)
Albumin: 3.7 g/dL (ref 3.5–5.2)
Alkaline Phosphatase: 40 U/L (ref 39–117)
Anion gap: 15 (ref 5–15)
BUN: 20 mg/dL (ref 6–23)
CO2: 26 mEq/L (ref 19–32)
Calcium: 9 mg/dL (ref 8.4–10.5)
Chloride: 99 mEq/L (ref 96–112)
Creatinine, Ser: 0.9 mg/dL (ref 0.50–1.35)
GFR calc Af Amer: >90 mL/min (ref >90)
GFR calc non Af Amer: >90 mL/min (ref >90)
Glucose, Bld: 126 mg/dL — ABNORMAL HIGH (ref 70–99)
Potassium: 3.4 mEq/L — ABNORMAL LOW (ref 3.7–5.3)
Sodium: 140 mEq/L (ref 137–147)
Total Bilirubin: 0.4 mg/dL (ref 0.3–1.2)
Total Protein: 7.4 g/dL (ref 6.0–8.3)

## 2014-01-13 LAB — CBC WITH DIFFERENTIAL/PLATELET
Band Neutrophils: 1 % (ref 0–10)
Basophils Absolute: 0 10*3/uL (ref 0.0–0.1)
Basophils Relative: 0 % (ref 0–1)
Eosinophils Absolute: 0 10*3/uL (ref 0.0–0.7)
Eosinophils Relative: 0 % (ref 0–5)
HCT: 45.2 % (ref 39.0–52.0)
Hemoglobin: 15.5 g/dL (ref 13.0–17.0)
Lymphocytes Relative: 4 % — ABNORMAL LOW (ref 12–46)
Lymphs Abs: 0.5 10*3/uL — ABNORMAL LOW (ref 0.7–4.0)
MCH: 28.5 pg (ref 26.0–34.0)
MCHC: 34.3 g/dL (ref 30.0–36.0)
MCV: 83.1 fL (ref 78.0–100.0)
Monocytes Absolute: 0.8 10*3/uL (ref 0.1–1.0)
Monocytes Relative: 6 % (ref 3–12)
Myelocytes: 1 %
Neutro Abs: 12.4 10*3/uL — ABNORMAL HIGH (ref 1.7–7.7)
Neutrophils Relative %: 88 % — ABNORMAL HIGH (ref 43–77)
Platelets: 306 10*3/uL (ref 150–400)
RBC: 5.44 MIL/uL (ref 4.22–5.81)
RDW: 14.8 % (ref 11.5–15.5)
WBC: 13.7 10*3/uL — ABNORMAL HIGH (ref 4.0–10.5)

## 2014-01-13 LAB — TROPONIN I: Troponin I: <0.3 ng/mL (ref <0.30)

## 2014-01-13 MED ORDER — ONDANSETRON 4 MG PO TBDP: 4.0000 mg | ORAL_TABLET | Freq: Three times a day (TID) | ORAL | Status: DC | PRN

## 2014-01-13 MED ORDER — ONDANSETRON HCL 4 MG/2ML IJ SOLN
4.0000 mg | Freq: Once | INTRAMUSCULAR | Status: AC
  Administered 2014-01-13: 4 mg via INTRAVENOUS
  Filled 2014-01-13: qty 2

## 2014-01-13 MED ORDER — SODIUM CHLORIDE 0.9 % IV BOLUS (SEPSIS)
1000.0000 mL | Freq: Once | INTRAVENOUS | Status: AC
  Administered 2014-01-13: 1000 mL via INTRAVENOUS

## 2014-01-13 NOTE — ED Provider Notes (Signed)
CSN: [id number]     Arrival date & time 01/13/14  1249 History   First MD Initiated Contact with Patient 01/13/14 1318     Chief Complaint  Patient presents with  . Weakness     (Consider location/radiation/quality/duration/timing/severity/associated sxs/prior Treatment) HPI Comments: Pt states that he started with vomiting and diarrhea this morning. Pt states that he has been unable to keep anything down.he states that he synopsized 3 times trying to get back to the bed. States that he has a similar history. He states that he was crawling so he didn't fall and hit his head. State that he is not having cp or abdominal pain. Hasn't taken anything for the symptoms  The history is provided by the patient. No language interpreter was used.    Past Medical History  Diagnosis Date  . Hypertension   . Hyperlipidemia   . Other testicular hypofunction   . Unspecified vitamin D deficiency   . Depression   . Obese 08/05/2013   Past Surgical History  Procedure Laterality Date  . No past surgeries    . Vasectomy  04/06/2012    Procedure: VASECTOMY;  Surgeon: Alexis Frock, MD;  Location: [facility];  Service: Urology;  Laterality: Bilateral;  . Cosmetic surgery      liposuction  . Cyst removal neck     Family History  Problem Relation Age of Onset  . Hypertension Mother   . Hyperlipidemia Mother   . Diabetes Mother   . Cancer Mother     breast  . Heart disease Father   . Hyperlipidemia Father   . Hypertension Father    History  Substance Use Topics  . Smoking status: Never Smoker   . Smokeless tobacco: Not on file  . Alcohol Use: 1.2 oz/week    2 Cans of beer per week    Review of Systems  All other systems reviewed and are negative.     Allergies  Zocor and Crestor  Home Medications   Prior to Admission medications   Medication Sig Start Date End Date Taking? Authorizing Provider  aspirin 81 MG tablet Take 81 mg by mouth daily.    Historical  Provider, MD  atenolol (TENORMIN) 100 MG tablet Take 100 mg by mouth daily. Takes 1 whole tab daily 03/12/13   Unk Pinto, MD  buPROPion (WELLBUTRIN XL) 300 MG 24 hr tablet Take 300 mg by mouth daily.    Historical Provider, MD  Cholecalciferol (VITAMIN D3) 5000 UNIT/ML LIQD Take 5,000 Units by mouth daily.    Historical Provider, MD  clotrimazole-betamethasone (LOTRISONE) cream Apply 1 application topically 2 (two) times daily. 04/29/13   Unk Pinto, MD  enalapril (VASOTEC) 10 MG tablet Take 10 mg by mouth daily.    Historical Provider, MD  hydrochlorothiazide (HYDRODIURIL) 25 MG tablet Take 1 tablet (25 mg total) by mouth daily. 10/03/13   Unk Pinto, MD  metFORMIN (GLUCOPHAGE XR) 500 MG 24 hr tablet Take 1 tablet 2 x/day with Bkfst & Lunch and 2 tablets with supper for Diabetes 12/08/13 12/09/14  Unk Pinto, MD  pravastatin (PRAVACHOL) 20 MG tablet Take 1 tablet (20 mg total) by mouth daily. 12/08/13   Unk Pinto, MD  predniSONE (DELTASONE) 20 MG tablet Take 1 tablet 3 x day x 3 days - then 2 x da x 3 days- then 1 x day x 5 days - the  Every other day 01/07/14 02/06/14  Unk Pinto, MD  terbinafine (LAMISIL) 250 MG tablet  12/04/13  Historical Provider, MD  testosterone cypionate (DEPOTESTOTERONE CYPIONATE) 200 MG/ML injection 2cc q 2 weeks 03/27/13   Ardis Hughs, PA-C  valACYclovir (VALTREX) 1000 MG tablet 1 tablet 3 x daily for cld sores / fever b;listers 04/24/13   Unk Pinto, MD  verapamil (CALAN) 120 MG tablet Take 120 mg by mouth once.     Historical Provider, MD   BP 113/70  Pulse 87  Temp(Src) 99.7 F (37.6 C) (Oral)  Resp 18  Ht 5\' 11"  (1.803 m)  Wt 269 lb (122.018 kg)  BMI 37.53 kg/m2  SpO2 97% Physical Exam  Nursing note and vitals reviewed. Constitutional: He is oriented to person, place, and time. He appears well-developed and well-nourished.  HENT:  Head: Normocephalic and atraumatic.  Eyes: Conjunctivae are normal.  Neck: Normal range of  motion. Neck supple.  Cardiovascular: Normal rate and regular rhythm.   Pulmonary/Chest: Effort normal and breath sounds normal.  Abdominal: Soft. Bowel sounds are normal.  Musculoskeletal: Normal range of motion.  Neurological: He is alert and oriented to person, place, and time. Coordination normal.  Skin: Skin is warm and dry.    ED Course  Procedures (including critical care time) Labs Review Labs Reviewed  COMPREHENSIVE METABOLIC PANEL - Abnormal; Notable for the following:    Potassium 3.4 (*)    Glucose, Bld 126 (*)    All other components within normal limits  CBC WITH DIFFERENTIAL - Abnormal; Notable for the following:    WBC 13.7 (*)    Neutrophils Relative % 88 (*)    Lymphocytes Relative 4 (*)    Neutro Abs 12.4 (*)    Lymphs Abs 0.5 (*)    All other components within normal limits  TROPONIN I    Imaging Review Dg Chest 2 View  01/13/2014   CLINICAL DATA:  Nausea and vomiting  EXAM: CHEST  2 VIEW  COMPARISON:  December 27, 2011  FINDINGS: Lungs are clear. Heart size and pulmonary vascularity are normal. No adenopathy. No bone lesions.  IMPRESSION: No edema or consolidation.   Electronically Signed   By: Lowella Grip M.D.   On: 01/13/2014 13:47     EKG Interpretation   Date/Time:  Monday January 13 2014 13:29:31 EDT Ventricular Rate:  83 PR Interval:  160 QRS Duration: 110 QT Interval:  364 QTC Calculation: 427 R Axis:   53 Text Interpretation:  Normal sinus rhythm Nonspecific T wave abnormality  Abnormal ECG No significant change since last tracing Confirmed by [facility]   MD, MARTHA [phone]) on 01/13/2014 2:43:15 PM      MDM   Final diagnoses:  Vomiting and diarrhea  Syncope, unspecified syncope type    Pt is tolerating po without any problem. Pt ambulating without any problem here. Pt was unable to clearly verify whether there was true syncope. Pt is okay to follow up with pcp as needed.will send home on zofran   Glendell Docker,  NP 01/13/14 1524

## 2014-01-13 NOTE — ED Notes (Signed)
Woke this am with vomiting and diarrhea. Feels weak. Passed out 3 times.

## 2014-01-13 NOTE — ED Provider Notes (Signed)
Medical screening examination/treatment/procedure(s) were performed by non-physician practitioner and as supervising physician I was immediately available for consultation/collaboration.   EKG Interpretation   Date/Time:  Monday January 13 2014 13:29:31 EDT Ventricular Rate:  83 PR Interval:  160 QRS Duration: 110 QT Interval:  364 QTC Calculation: 427 R Axis:   53 Text Interpretation:  Normal sinus rhythm Nonspecific T wave abnormality  Abnormal ECG No significant change since last tracing Confirmed by [facility]   MD, MARTHA [phone]) on 01/13/2014 2:43:15 PM       Threasa Beards, MD 01/13/14 [phone]

## 2014-01-13 NOTE — Discharge Instructions (Signed)

## 2014-02-03 ENCOUNTER — Other Ambulatory Visit: Payer: Self-pay | Admitting: *Deleted

## 2014-02-03 DIAGNOSIS — E291 Testicular hypofunction: Secondary | ICD-10-CM

## 2014-02-03 MED ORDER — TESTOSTERONE CYPIONATE 200 MG/ML IM SOLN: INTRAMUSCULAR | Status: DC

## 2014-02-03 MED ORDER — VERAPAMIL HCL 120 MG PO TABS: 120.0000 mg | ORAL_TABLET | Freq: Once | ORAL | Status: DC

## 2014-02-05 ENCOUNTER — Other Ambulatory Visit: Payer: Self-pay | Admitting: Internal Medicine

## 2014-02-05 DIAGNOSIS — I10 Essential (primary) hypertension: Secondary | ICD-10-CM

## 2014-02-05 MED ORDER — VERAPAMIL HCL ER 240 MG PO TBCR: EXTENDED_RELEASE_TABLET | ORAL | Status: DC

## 2014-02-06 ENCOUNTER — Ambulatory Visit: Payer: Self-pay | Admitting: Internal Medicine

## 2014-03-04 ENCOUNTER — Encounter: Payer: Self-pay | Admitting: Internal Medicine

## 2014-03-04 ENCOUNTER — Ambulatory Visit (INDEPENDENT_AMBULATORY_CARE_PROVIDER_SITE_OTHER): Payer: Managed Care, Other (non HMO) | Admitting: Internal Medicine

## 2014-03-04 VITALS — BP 128/72 | HR 56 | Temp 98.1°F | Resp 16 | Ht 71.5 in | Wt 269.0 lb

## 2014-03-04 DIAGNOSIS — E291 Testicular hypofunction: Secondary | ICD-10-CM

## 2014-03-04 DIAGNOSIS — R7303 Prediabetes: Secondary | ICD-10-CM

## 2014-03-04 DIAGNOSIS — E785 Hyperlipidemia, unspecified: Secondary | ICD-10-CM

## 2014-03-04 DIAGNOSIS — R7309 Other abnormal glucose: Secondary | ICD-10-CM

## 2014-03-04 DIAGNOSIS — E559 Vitamin D deficiency, unspecified: Secondary | ICD-10-CM

## 2014-03-04 DIAGNOSIS — I10 Essential (primary) hypertension: Secondary | ICD-10-CM

## 2014-03-04 DIAGNOSIS — E349 Endocrine disorder, unspecified: Secondary | ICD-10-CM

## 2014-03-04 DIAGNOSIS — Z79899 Other long term (current) drug therapy: Secondary | ICD-10-CM

## 2014-03-04 MED ORDER — PANTOPRAZOLE SODIUM 40 MG PO TBEC: DELAYED_RELEASE_TABLET | ORAL | Status: DC

## 2014-03-04 NOTE — Patient Instructions (Signed)

## 2014-03-04 NOTE — Progress Notes (Signed)
Patient ID: Nicholas Crosby, male   DOB: 09-Jun-1965, 48 y.o.   MRN: [id number]   This very nice 48 y.o.[name] presents for 3 month follow up with Hypertension, Hyperlipidemia, Pre-Diabetes and Vitamin D Deficiency.     Patient has had several month hx/o sore throat and has tried OTC Omeprazole with possible slight improvement.    Patient is treated for HTN (1993) & BP has been controlled at home. Today's BP: 128/72 mmHg. Patient has had no complaints of any cardiac type chest pain, palpitations, dyspnea/orthopnea/PND, dizziness, claudication, or dependent edema.   Hyperlipidemia is controlled with diet & meds. Patient denies myalgias or other med SE's. Last Lipids were near goal - Total  Chol 190; HDL 40; LDL  101; Trig 247 on  12/03/2013.   Also, the patient has history of Morbid Obesity (BMI 37) and consequent T2_NIDDM (2012 with A1c 6.4%) and has had no symptoms of reactive hypoglycemia, diabetic polys, paresthesias or visual blurring.  He was prescribed last year which he never filled. He does not monitor CBG's. Last A1c was  6.3% on  12/03/2013.   Further, the patient also has history of Vitamin D Deficiency and supplements vitamin D without any suspected side-effects. Last vitamin D was 57 on 12/03/2013.   Medication List   atenolol 100 MG tablet  Commonly known as:  TENORMIN  Take 100 mg by mouth daily. Takes 1 whole tab daily     clotrimazole-betamethasone cream  Commonly known as:  LOTRISONE  Apply 1 application topically 2 (two) times daily.     enalapril 10 MG tablet  Commonly known as:  VASOTEC  Take 10 mg by mouth daily.     hydrochlorothiazide 25 MG tablet  Commonly known as:  HYDRODIURIL  Take 1 tablet (25 mg total) by mouth daily.     metFORMIN 500 MG 24 hr tablet  Commonly known as:  GLUCOPHAGE XR  Take 1 tablet 2 x/day with Bkfst & Lunch and 2 tablets with supper for Diabetes     pantoprazole 40 MG tablet  Commonly known as:  PROTONIX  Take 1 tablet with Supper or  Bedtime for Acid Reflux     pravastatin 20 MG tablet  Commonly known as:  PRAVACHOL  Take 1 tablet (20 mg total) by mouth daily.     PROCTOSOL HC 2.5 % rectal cream  Generic drug:  hydrocortisone     testosterone cypionate 200 MG/ML injection  Commonly known as:  DEPOTESTOTERONE CYPIONATE  2cc q 2 weeks     valACYclovir 1000 MG tablet  Commonly known as:  VALTREX  1 tablet 3 x daily for cld sores / fever b;listers     verapamil 240 MG CR tablet  Commonly known as:  CALAN-SR  Take 1 tablet daily with food for BP     Vitamin D3 5000 UNIT/ML Liqd  Take 5,000 Units by mouth daily.     Allergies  Allergen Reactions  . Zocor [Simvastatin] Other (See Comments)    cpk elev  . Crestor [Rosuvastatin] Palpitations   PMHx:   Past Medical History  Diagnosis Date  . Hypertension   . Hyperlipidemia   . Other testicular hypofunction   . Unspecified vitamin D deficiency   . Depression   . Obese 08/05/2013   Immunization History  Administered Date(s) Administered  . Tdap 01/09/2007   Past Surgical History  Procedure Laterality Date  . No past surgeries    . Vasectomy  04/06/2012    Procedure: VASECTOMY;  Surgeon:  Alexis Frock, MD;  Location: [facility];  Service: Urology;  Laterality: Bilateral;  . Cosmetic surgery      liposuction  . Cyst removal neck     FHx:    Reviewed / unchanged  SHx:    Reviewed / unchanged  Systems Review:  Constitutional: Denies fever, chills, wt changes, headaches, insomnia, fatigue, night sweats, change in appetite. Eyes: Denies redness, blurred vision, diplopia, discharge, itchy, watery eyes.  ENT: Denies discharge, congestion, post nasal drip, epistaxis, sore throat, earache, hearing loss, dental pain, tinnitus, vertigo, sinus pain, snoring.  CV: Denies chest pain, palpitations, irregular heartbeat, syncope, dyspnea, diaphoresis, orthopnea, PND, claudication or edema. Respiratory: denies cough, dyspnea, DOE, pleurisy,  hoarseness, laryngitis, wheezing.  Gastrointestinal: Denies dysphagia, odynophagia, heartburn, reflux, water brash, abdominal pain or cramps, nausea, vomiting, bloating, diarrhea, constipation, hematemesis, melena, hematochezia  or hemorrhoids. Genitourinary: Denies dysuria, frequency, urgency, nocturia, hesitancy, discharge, hematuria or flank pain. Musculoskeletal: Denies arthralgias, myalgias, stiffness, jt. swelling, pain, limping or strain/sprain.  Skin: Denies pruritus, rash, hives, warts, acne, eczema or change in skin lesion(s). Neuro: No weakness, tremor, incoordination, spasms, paresthesia or pain. Psychiatric: Denies confusion, memory loss or sensory loss. Endo: Denies change in weight, skin or hair change.  Heme/Lymph: No excessive bleeding, bruising or enlarged lymph nodes.  Exam:  BP 128/72   Pulse 56  Temp 98.1 F   Resp 16  Ht 5' 11.5"   Wt 269 lb  BMI 37.00   Appears well nourished and in no distress. Eyes: PERRLA, EOMs, conjunctiva no swelling or erythema. Sinuses: No frontal/maxillary tenderness ENT/Mouth: EAC's clear, TM's nl w/o erythema, bulging. Nares clear w/o erythema, swelling, exudates. Oropharynx clear without erythema or exudates. Oral hygiene is good. Tongue normal, non obstructing. Hearing intact.  Neck: Supple. Thyroid nl. Car 2+/2+ without bruits, nodes or JVD. Chest: Respirations nl with BS clear & equal w/o rales, rhonchi, wheezing or stridor.  Cor: Heart sounds normal w/ regular rate and rhythm without sig. murmurs, gallops, clicks, or rubs. Peripheral pulses normal and equal  without edema.  Abdomen: Soft & bowel sounds normal. Non-tender w/o guarding, rebound, hernias, masses, or organomegaly.  Lymphatics: Unremarkable.  Musculoskeletal: Full ROM all peripheral extremities, joint stability, 5/5 strength, and normal gait.  Skin: Warm, dry without exposed rashes, lesions or ecchymosis apparent.  Neuro: Cranial nerves intact, reflexes equal  bilaterally. Sensory-motor testing grossly intact. Tendon reflexes grossly intact.  Pysch: Alert & oriented x 3.  Insight and judgement nl & appropriate. No ideations.  Assessment and Plan:  1. Hypertension - Continue monitor blood pressure at home. Continue diet/meds same.  2. Hyperlipidemia - Continue diet/meds, exercise,& lifestyle modifications. Continue monitor periodic cholesterol/liver & renal functions   3. T2_NIDDM - Continue diet, exercise, lifestyle modifications. Medication recommendations pending labs. Monitor appropriate labs.  4. Vitamin D Deficiency - Continue supplementation.\  5. GERD & ST - will try higher dose PPI with Pantoprazole 40 mg   Recommended regular exercise, BP monitoring, weight control, and discussed med and SE's. Recommended labs to assess and monitor clinical status. Further disposition pending results of labs.

## 2014-03-05 LAB — BASIC METABOLIC PANEL WITH GFR
BUN: 11 mg/dL (ref 6–23)
CO2: 30 mEq/L (ref 19–32)
Calcium: 9 mg/dL (ref 8.4–10.5)
Chloride: 102 mEq/L (ref 96–112)
Creat: 0.95 mg/dL (ref 0.50–1.35)
GFR, Est African American: >89 mL/min
GFR, Est Non African American: >89 mL/min
Glucose, Bld: 95 mg/dL (ref 70–99)
Potassium: 4.1 mEq/L (ref 3.5–5.3)
Sodium: 141 mEq/L (ref 135–145)

## 2014-03-05 LAB — HEPATIC FUNCTION PANEL
ALT: 25 U/L (ref 0–53)
AST: 19 U/L (ref 0–37)
Albumin: 4 g/dL (ref 3.5–5.2)
Alkaline Phosphatase: 34 U/L — ABNORMAL LOW (ref 39–117)
Bilirubin, Direct: 0.1 mg/dL (ref 0.0–0.3)
Indirect Bilirubin: 0.3 mg/dL (ref 0.2–1.2)
Total Bilirubin: 0.4 mg/dL (ref 0.2–1.2)
Total Protein: 6.8 g/dL (ref 6.0–8.3)

## 2014-03-05 LAB — CBC WITH DIFFERENTIAL/PLATELET
Basophils Absolute: 0 10*3/uL (ref 0.0–0.1)
Basophils Relative: 0 % (ref 0–1)
Eosinophils Absolute: 0.1 10*3/uL (ref 0.0–0.7)
Eosinophils Relative: 2 % (ref 0–5)
HCT: 41 % (ref 39.0–52.0)
Hemoglobin: 14.4 g/dL (ref 13.0–17.0)
Lymphocytes Relative: 29 % (ref 12–46)
Lymphs Abs: 2.1 10*3/uL (ref 0.7–4.0)
MCH: 28.6 pg (ref 26.0–34.0)
MCHC: 35.1 g/dL (ref 30.0–36.0)
MCV: 81.5 fL (ref 78.0–100.0)
MPV: 10.4 fL (ref 9.4–12.4)
Monocytes Absolute: 0.7 10*3/uL (ref 0.1–1.0)
Monocytes Relative: 10 % (ref 3–12)
Neutro Abs: 4.4 10*3/uL (ref 1.7–7.7)
Neutrophils Relative %: 59 % (ref 43–77)
Platelets: 248 10*3/uL (ref 150–400)
RBC: 5.03 MIL/uL (ref 4.22–5.81)
RDW: 13.9 % (ref 11.5–15.5)
WBC: 7.4 10*3/uL (ref 4.0–10.5)

## 2014-03-05 LAB — LIPID PANEL
Cholesterol: 185 mg/dL (ref 0–200)
HDL: 39 mg/dL — ABNORMAL LOW (ref >39)
LDL Cholesterol: 109 mg/dL — ABNORMAL HIGH (ref 0–99)
Total CHOL/HDL Ratio: 4.7 Ratio
Triglycerides: 187 mg/dL — ABNORMAL HIGH (ref <150)
VLDL: 37 mg/dL (ref 0–40)

## 2014-03-05 LAB — HEMOGLOBIN A1C
Hgb A1c MFr Bld: 6.4 % — ABNORMAL HIGH (ref <5.7)
Mean Plasma Glucose: 137 mg/dL — ABNORMAL HIGH (ref <117)

## 2014-03-05 LAB — MAGNESIUM: Magnesium: 1.9 mg/dL (ref 1.5–2.5)

## 2014-03-05 LAB — TESTOSTERONE: Testosterone: 172 ng/dL — ABNORMAL LOW (ref 300–890)

## 2014-03-05 LAB — INSULIN, FASTING: Insulin fasting, serum: 9.2 u[IU]/mL (ref 2.0–19.6)

## 2014-03-05 LAB — VITAMIN D 25 HYDROXY (VIT D DEFICIENCY, FRACTURES): Vit D, 25-Hydroxy: 31 ng/mL (ref 30–100)

## 2014-03-05 LAB — TSH: TSH: 2.154 u[IU]/mL (ref 0.350–4.500)

## 2014-04-02 ENCOUNTER — Other Ambulatory Visit: Payer: Self-pay | Admitting: Internal Medicine

## 2014-04-02 ENCOUNTER — Encounter: Payer: Self-pay | Admitting: Internal Medicine

## 2014-04-02 DIAGNOSIS — F40298 Other specified phobia: Secondary | ICD-10-CM

## 2014-04-09 ENCOUNTER — Ambulatory Visit: Payer: Self-pay | Admitting: Physician Assistant

## 2014-06-04 ENCOUNTER — Ambulatory Visit (INDEPENDENT_AMBULATORY_CARE_PROVIDER_SITE_OTHER): Payer: Managed Care, Other (non HMO) | Admitting: Internal Medicine

## 2014-06-04 ENCOUNTER — Encounter: Payer: Self-pay | Admitting: Internal Medicine

## 2014-06-04 VITALS — BP 122/84 | HR 72 | Temp 99.5°F | Resp 16 | Ht 72.0 in | Wt 273.2 lb

## 2014-06-04 DIAGNOSIS — E349 Endocrine disorder, unspecified: Secondary | ICD-10-CM

## 2014-06-04 DIAGNOSIS — R7309 Other abnormal glucose: Secondary | ICD-10-CM

## 2014-06-04 DIAGNOSIS — R7303 Prediabetes: Secondary | ICD-10-CM

## 2014-06-04 DIAGNOSIS — Z79899 Other long term (current) drug therapy: Secondary | ICD-10-CM

## 2014-06-04 DIAGNOSIS — E669 Obesity, unspecified: Secondary | ICD-10-CM

## 2014-06-04 DIAGNOSIS — I10 Essential (primary) hypertension: Secondary | ICD-10-CM

## 2014-06-04 DIAGNOSIS — E785 Hyperlipidemia, unspecified: Secondary | ICD-10-CM

## 2014-06-04 DIAGNOSIS — E291 Testicular hypofunction: Secondary | ICD-10-CM

## 2014-06-04 DIAGNOSIS — E559 Vitamin D deficiency, unspecified: Secondary | ICD-10-CM

## 2014-06-04 LAB — CBC WITH DIFFERENTIAL/PLATELET
Basophils Absolute: 0 10*3/uL (ref 0.0–0.1)
Basophils Relative: 0 % (ref 0–1)
Eosinophils Absolute: 0.2 10*3/uL (ref 0.0–0.7)
Eosinophils Relative: 3 % (ref 0–5)
HCT: 40.9 % (ref 39.0–52.0)
Hemoglobin: 13.9 g/dL (ref 13.0–17.0)
Lymphocytes Relative: 27 % (ref 12–46)
Lymphs Abs: 2 10*3/uL (ref 0.7–4.0)
MCH: 27.3 pg (ref 26.0–34.0)
MCHC: 34 g/dL (ref 30.0–36.0)
MCV: 80.4 fL (ref 78.0–100.0)
MPV: 10.5 fL (ref 8.6–12.4)
Monocytes Absolute: 0.4 10*3/uL (ref 0.1–1.0)
Monocytes Relative: 6 % (ref 3–12)
Neutro Abs: 4.7 10*3/uL (ref 1.7–7.7)
Neutrophils Relative %: 64 % (ref 43–77)
Platelets: 257 10*3/uL (ref 150–400)
RBC: 5.09 MIL/uL (ref 4.22–5.81)
RDW: 14.4 % (ref 11.5–15.5)
WBC: 7.3 10*3/uL (ref 4.0–10.5)

## 2014-06-04 NOTE — Progress Notes (Signed)
Patient ID: Nicholas Crosby, male   DOB: [birth year], 49 y.o.   MRN: [id number]   This very nice 49 y.o. [name] presents for 3 month follow up with Hypertension, Hyperlipidemia, T2DM / Pre-Diabetes and Vitamin D Deficiency.    Patient is treated for HTN & BP has been controlled at home. Today's BP: 122/84 mmHg. Patient has had no complaints of any cardiac type chest pain, palpitations, dyspnea/orthopnea/PND, dizziness, claudication, or dependent edema.   Hyperlipidemia is controlled with diet & meds. Patient denies myalgias or other med SE's. Last Lipids were Total Chol 185; HDL 39*; LDL 109*; Triglycerides 187 on 03/04/2014.   Also, the patient has history of T2_NIDDM / PreDiabetes and has had no symptoms of reactive hypoglycemia, diabetic polys, paresthesias or visual blurring.  Last A1c was  6.4% on  03/04/2014. Patient admits poor dietary compliance and had refuse a trial on Metformin to help with weight loss and lowering A1c.   Further, the patient also has history of Vitamin D Deficiency and supplements vitamin D without any suspected side-effects. Last vitamin D was  31 on  03/04/2014.  Medication Sig  . aspirin 81 MG tablet Take 81 mg by mouth daily.  Marland Kitchen atenolol (TENORMIN) 100 MG tablet Take 100 mg by mouth daily. Takes 1 whole tab daily  . Cholecalciferol (VITAMIN D3) 5000 UNIT/ML LIQD Take 5,000 Units by mouth daily.  . clotrimazole-betamethasone (LOTRISONE) cream Apply 1 application topically 2 (two) times daily.  . enalapril (VASOTEC) 10 MG tablet Take 10 mg by mouth daily.  . hydrochlorothiazide (HYDRODIURIL) 25 MG tablet Take 1 tablet (25 mg total) by mouth daily.  . pravastatin (PRAVACHOL) 20 MG tablet Take 1 tablet (20 mg total) by mouth daily.  Marland Kitchen PROCTOSOL HC 2.5 % rectal cream   . DEPOTESTOTERONE 200 MG/ML injection 2cc q 2 weeks  . valACYclovir (VALTREX) 1000 MG tablet 1 tablet 3 x daily for cld sores / fever b;listers  . verapamil (CALAN-SR) 240 MG CR tablet Take 1 tablet daily  with food for BP  . metFORMIN (GLUCOPHAGE XR) 500 MG 24 hr tablet Take 1 tablet 2 x/day with Bkfst & Lunch and 2 tablets with supper for Diabetes (Patient not taking: Reported on 06/04/2014)  . pantoprazole (PROTONIX) 40 MG tablet Take 1 tablet with Supper or Bedtime (Patient not taking: Reported on 06/04/2014)   Allergies  Allergen Reactions  . Zocor [Simvastatin] Other (See Comments)    cpk elev  . Crestor [Rosuvastatin] Palpitations   PMHx:   Past Medical History  Diagnosis Date  . Hypertension   . Hyperlipidemia   . Other testicular hypofunction   . Unspecified vitamin D deficiency   . Depression   . Obese 08/05/2013   Immunization History  Administered Date(s) Administered  . Tdap 01/09/2007   Past Surgical History  Procedure Laterality Date  . No past surgeries    . Vasectomy  04/06/2012    Procedure: VASECTOMY;  Surgeon: Alexis Frock, MD;  Location: [facility];  Service: Urology;  Laterality: Bilateral;  . Cosmetic surgery      liposuction  . Cyst removal neck     FHx:    Reviewed / unchanged  SHx:    Reviewed / unchanged  Systems Review:  Constitutional: Denies fever, chills, wt changes, headaches, insomnia, fatigue, night sweats, change in appetite. Eyes: Denies redness, blurred vision, diplopia, discharge, itchy, watery eyes.  ENT: Denies discharge, congestion, post nasal drip, epistaxis, sore throat, earache, hearing loss, dental pain, tinnitus, vertigo, sinus  pain, snoring.  CV: Denies chest pain, palpitations, irregular heartbeat, syncope, dyspnea, diaphoresis, orthopnea, PND, claudication or edema. Respiratory: denies cough, dyspnea, DOE, pleurisy, hoarseness, laryngitis, wheezing.  Gastrointestinal: Denies dysphagia, odynophagia, heartburn, reflux, water brash, abdominal pain or cramps, nausea, vomiting, bloating, diarrhea, constipation, hematemesis, melena, hematochezia  or hemorrhoids. Genitourinary: Denies dysuria, frequency, urgency,  nocturia, hesitancy, discharge, hematuria or flank pain. Musculoskeletal: Denies arthralgias, myalgias, stiffness, jt. swelling, pain, limping or strain/sprain.  Skin: Denies pruritus, rash, hives, warts, acne, eczema or change in skin lesion(s). Neuro: No weakness, tremor, incoordination, spasms, paresthesia or pain. Psychiatric: Denies confusion, memory loss or sensory loss. Endo: Denies change in weight, skin or hair change.  Heme/Lymph: No excessive bleeding, bruising or enlarged lymph nodes.  Physical Exam  BP 122/84   Pulse 72  Temp99.5 F   Resp 16  Ht 6'   Wt 273 lb 3.2 oz     BMI 37.04   Appears well nourished and in no distress. Eyes: PERRLA, EOMs, conjunctiva no swelling or erythema. Sinuses: No frontal/maxillary tenderness ENT/Mouth: EAC's clear, TM's nl w/o erythema, bulging. Nares clear w/o erythema, swelling, exudates. Oropharynx clear without erythema or exudates. Oral hygiene is good. Tongue normal, non obstructing. Hearing intact.  Neck: Supple. Thyroid nl. Car 2+/2+ without bruits, nodes or JVD. Chest: Respirations nl with BS clear & equal w/o rales, rhonchi, wheezing or stridor.  Cor: Heart sounds normal w/ regular rate and rhythm without sig. murmurs, gallops, clicks, or rubs. Peripheral pulses normal and equal  without edema.  Abdomen: Soft & bowel sounds normal. Non-tender w/o guarding, rebound, hernias, masses, or organomegaly.  Lymphatics: Unremarkable.  Musculoskeletal: Full ROM all peripheral extremities, joint stability, 5/5 strength, and normal gait.  Skin: Warm, dry without exposed rashes, lesions or ecchymosis apparent.  Neuro: Cranial nerves intact, reflexes equal bilaterally. Sensory-motor testing grossly intact. Tendon reflexes grossly intact.  Pysch: Alert & oriented x 3.  Insight and judgement nl & appropriate. No ideations.  Assessment and Plan:  1. Essential hypertension  - TSH  2. Obese   3. Hyperlipidemia  - Lipid panel  4.  Prediabetes  - Hemoglobin A1c - Insulin, fasting  5. Vitamin D deficiency  - Vit D  25 hydroxy (rtn osteoporosis monitoring)  6. Testosterone deficiency  - Testosterone  7. Medication management  - CBC with Differential/Platelet - BASIC METABOLIC PANEL WITH GFR - Hepatic function panel - Magnesium   Recommended regular exercise, BP monitoring, weight control, and discussed med and SE's. Recommended labs to assess and monitor clinical status. Further disposition pending results of labs.

## 2014-06-04 NOTE — Patient Instructions (Signed)

## 2014-06-05 LAB — HEPATIC FUNCTION PANEL
ALT: 25 U/L (ref 0–53)
AST: 18 U/L (ref 0–37)
Albumin: 4.1 g/dL (ref 3.5–5.2)
Alkaline Phosphatase: 44 U/L (ref 39–117)
Bilirubin, Direct: 0.1 mg/dL (ref 0.0–0.3)
Indirect Bilirubin: 0.2 mg/dL (ref 0.2–1.2)
Total Bilirubin: 0.3 mg/dL (ref 0.2–1.2)
Total Protein: 7 g/dL (ref 6.0–8.3)

## 2014-06-05 LAB — TESTOSTERONE: Testosterone: 918 ng/dL — ABNORMAL HIGH (ref 300–890)

## 2014-06-05 LAB — BASIC METABOLIC PANEL WITH GFR
BUN: 14 mg/dL (ref 6–23)
CO2: 31 mEq/L (ref 19–32)
Calcium: 9.3 mg/dL (ref 8.4–10.5)
Chloride: 102 mEq/L (ref 96–112)
Creat: 0.83 mg/dL (ref 0.50–1.35)
GFR, Est African American: >89 mL/min
GFR, Est Non African American: >89 mL/min
Glucose, Bld: 100 mg/dL — ABNORMAL HIGH (ref 70–99)
Potassium: 4.2 mEq/L (ref 3.5–5.3)
Sodium: 139 mEq/L (ref 135–145)

## 2014-06-05 LAB — INSULIN, FASTING: Insulin fasting, serum: 14 u[IU]/mL (ref 2.0–19.6)

## 2014-06-05 LAB — LIPID PANEL
Cholesterol: 190 mg/dL (ref 0–200)
HDL: 34 mg/dL — ABNORMAL LOW (ref >39)
LDL Cholesterol: 99 mg/dL (ref 0–99)
Total CHOL/HDL Ratio: 5.6 Ratio
Triglycerides: 285 mg/dL — ABNORMAL HIGH (ref <150)
VLDL: 57 mg/dL — ABNORMAL HIGH (ref 0–40)

## 2014-06-05 LAB — HEMOGLOBIN A1C
Hgb A1c MFr Bld: 6 % — ABNORMAL HIGH (ref <5.7)
Mean Plasma Glucose: 126 mg/dL — ABNORMAL HIGH (ref <117)

## 2014-06-05 LAB — MAGNESIUM: Magnesium: 1.7 mg/dL (ref 1.5–2.5)

## 2014-06-05 LAB — VITAMIN D 25 HYDROXY (VIT D DEFICIENCY, FRACTURES): Vit D, 25-Hydroxy: 24 ng/mL — ABNORMAL LOW (ref 30–100)

## 2014-06-05 LAB — TSH: TSH: 1.632 u[IU]/mL (ref 0.350–4.500)

## 2014-09-11 ENCOUNTER — Ambulatory Visit (INDEPENDENT_AMBULATORY_CARE_PROVIDER_SITE_OTHER): Payer: Managed Care, Other (non HMO) | Admitting: Physician Assistant

## 2014-09-11 ENCOUNTER — Encounter: Payer: Self-pay | Admitting: Physician Assistant

## 2014-09-11 VITALS — BP 130/78 | HR 60 | Temp 97.7°F | Resp 16 | Ht 71.5 in | Wt 273.0 lb

## 2014-09-11 DIAGNOSIS — R7309 Other abnormal glucose: Secondary | ICD-10-CM

## 2014-09-11 DIAGNOSIS — E349 Endocrine disorder, unspecified: Secondary | ICD-10-CM

## 2014-09-11 DIAGNOSIS — I10 Essential (primary) hypertension: Secondary | ICD-10-CM

## 2014-09-11 DIAGNOSIS — Z79899 Other long term (current) drug therapy: Secondary | ICD-10-CM

## 2014-09-11 DIAGNOSIS — E559 Vitamin D deficiency, unspecified: Secondary | ICD-10-CM

## 2014-09-11 DIAGNOSIS — R7303 Prediabetes: Secondary | ICD-10-CM

## 2014-09-11 DIAGNOSIS — E782 Mixed hyperlipidemia: Secondary | ICD-10-CM

## 2014-09-11 DIAGNOSIS — E669 Obesity, unspecified: Secondary | ICD-10-CM

## 2014-09-11 DIAGNOSIS — E291 Testicular hypofunction: Secondary | ICD-10-CM

## 2014-09-11 DIAGNOSIS — E785 Hyperlipidemia, unspecified: Secondary | ICD-10-CM

## 2014-09-11 LAB — CBC WITH DIFFERENTIAL/PLATELET
Basophils Absolute: 0.1 10*3/uL (ref 0.0–0.1)
Basophils Relative: 1 % (ref 0–1)
Eosinophils Absolute: 0.2 10*3/uL (ref 0.0–0.7)
Eosinophils Relative: 3 % (ref 0–5)
HCT: 41.3 % (ref 39.0–52.0)
Hemoglobin: 14.3 g/dL (ref 13.0–17.0)
Lymphocytes Relative: 33 % (ref 12–46)
Lymphs Abs: 2 10*3/uL (ref 0.7–4.0)
MCH: 27.8 pg (ref 26.0–34.0)
MCHC: 34.6 g/dL (ref 30.0–36.0)
MCV: 80.2 fL (ref 78.0–100.0)
MPV: 10 fL (ref 8.6–12.4)
Monocytes Absolute: 0.5 10*3/uL (ref 0.1–1.0)
Monocytes Relative: 8 % (ref 3–12)
Neutro Abs: 3.4 10*3/uL (ref 1.7–7.7)
Neutrophils Relative %: 55 % (ref 43–77)
Platelets: 317 10*3/uL (ref 150–400)
RBC: 5.15 MIL/uL (ref 4.22–5.81)
RDW: 14.7 % (ref 11.5–15.5)
WBC: 6.1 10*3/uL (ref 4.0–10.5)

## 2014-09-11 LAB — HEMOGLOBIN A1C
Hgb A1c MFr Bld: 5.9 % — ABNORMAL HIGH (ref <5.7)
Mean Plasma Glucose: 123 mg/dL — ABNORMAL HIGH (ref <117)

## 2014-09-11 NOTE — Patient Instructions (Addendum)
We are starting you on Metformin to prevent or treat diabetes. Metformin does not cause low blood sugars. In order to create energy your cells need insulin and sugar but sometime your cells do not accept the insulin and this can cause increased sugars and decreased energy. The Metformin helps your cells accept insulin and the sugar to give you more energy.   The two most common side effects are nausea and diarrhea, follow these rules to avoid it! You can take imodium per box instructions when starting metformin if needed.   Rules of metformin: 1) start out slow with only one pill daily. Our goal for you is 4 pills a day or 2000mg  total.  2) take with your largest meal. 3) Take with least amount of carbs.   Call if you have any problems.    Diabetes is a very complicated disease...lets simplify it.  An easy way to look at it to understand the complications is if you think of the extra sugar floating in your blood stream as glass shards floating through your blood stream.    Diabetes affects your small vessels first: 1) The glass shards (sugar) scraps down the tiny blood vessels in your eyes and lead to diabetic retinopathy, the leading cause of blindness in the [place]. Diabetes is the leading cause of newly diagnosed adult (64 to 48 years of age) blindness in the [place].  2) The glass shards scratches down the tiny vessels of your legs leading to nerve damage called neuropathy and can lead to amputations of your feet. More than 60% of all non-traumatic amputations of lower limbs occur in people with diabetes.  3) Over time the small vessels in your brain are shredded and closed off, individually this does not cause any problems but over a long period of time many of the small vessels being blocked can lead to Vascular Dementia.   4) Your kidney's are a filter system and have a "net" that keeps certain things in the body and lets bad things out. Sugar shreds this net and leads to kidney  damage and eventually failure. Decreasing the sugar that is destroying the net and certain blood pressure medications can help stop or decrease progression of kidney disease. Diabetes was the primary cause of kidney failure in 44 percent of all new cases in 2011.  5) Diabetes also destroys the small vessels in your penis that lead to erectile dysfunction. Eventually the vessels are so damaged that you may not be responsive to cialis or viagra.   Diabetes and your large vessels: Your larger vessels consist of your coronary arteries in your heart and the carotid vessels to your brain. Diabetes or even increased sugars put you at 300% increased risk of heart attack and stroke and this is why.. The sugar scrapes down your large blood vessels and your body sees this as an internal injury and tries to repair itself. Just like you get a scab on your skin, your platelets will stick to the blood vessel wall trying to heal it. This is why we have diabetics on low dose aspirin daily, this prevents the platelets from sticking and can prevent plaque formation. In addition, your body takes cholesterol and tries to shove it into the open wound. This is why we want your LDL, or bad cholesterol, below 70.   The combination of platelets and cholesterol over 5-10 years forms plaque that can break off and cause a heart attack or stroke.   PLEASE REMEMBER:  Diabetes  is preventable! Up to 22 percent of complications and morbidities among individuals with type 2 diabetes can be prevented, delayed, or effectively treated and minimized with regular visits to a health professional, appropriate monitoring and medication, and a healthy diet and lifestyle.  We want weight loss that will last so you should lose 1-2 pounds a week.  THAT IS IT! Please pick THREE things a month to change. Once it is a habit check off the item. Then pick another three items off the list to become habits.  If you are already doing a habit on the list  GREAT!  Cross that item off! o Don't drink your calories. Ie, alcohol, soda, fruit juice, and sweet tea.  o Drink more water. Drink a glass when you feel hungry or before each meal.  o Eat breakfast - Complex carb and protein (likeDannon light and fit yogurt, oatmeal, fruit, eggs, [place] bacon). o Measure your cereal.  Eat no more than one cup a day. (ie [place]) o Eat an apple a day. o Add a vegetable a day. o Try a new vegetable a month. o Use Pam! Stop using oil or butter to cook. o Don't finish your plate or use smaller plates. o Share your dessert. o Eat sugar free Jello for dessert or frozen grapes. o Don't eat 2-3 hours before bed. o Switch to whole wheat bread, pasta, and brown rice. o Make healthier choices when you eat out. No fries! o Pick baked chicken, NOT fried. o Don't forget to SLOW DOWN when you eat. It is not going anywhere.  o Take the stairs. o Park far away in the parking lot o [employer] (or weights) for 10 minutes while watching TV. o Walk at work for 10 minutes during break. o Walk outside 1 time a week with your friend, kids, dog, or significant other. o Start a walking group at [place] the mall as much as you can tolerate.  o Keep a food diary. o Weigh yourself daily. o Walk for 15 minutes 3 days per week. o Cook at home more often and eat out less.  If life happens and you go back to old habits, it is okay.  Just start over. You can do it!   If you experience chest pain, get short of breath, or tired during the exercise, please stop immediately and inform your doctor.   Before you even begin to attack a weight-loss plan, it pays to remember this: You are not fat. You have fat. Losing weight isn't about blame or shame; it's simply another achievement to accomplish. Dieting is like any other skill-you have to buckle down and work at it. As long as you act in a smart, reasonable way, you'll ultimately get where you want to be. Here are some weight loss  pearls for you.  1. It's Not a Diet. It's a Lifestyle Thinking of a diet as something you're on and suffering through only for the short term doesn't work. To shed weight and keep it off, you need to make permanent changes to the way you eat. It's OK to indulge occasionally, of course, but if you cut calories temporarily and then revert to your old way of eating, you'll gain back the weight quicker than you can say yo-yo. Use it to lose it. Research shows that one of the best predictors of long-term weight loss is how many pounds you drop in the first month. For that reason, nutritionists often suggest being stricter  for the first two weeks of your new eating strategy to build momentum. Cut out added sugar and alcohol and avoid unrefined carbs. After that, figure out how you can reincorporate them in a way that's healthy and maintainable.  2. There's a Right Way to Exercise Working out burns calories and fat and boosts your metabolism by building muscle. But those trying to lose weight are notorious for overestimating the number of calories they burn and underestimating the amount they take in. Unfortunately, your system is biologically programmed to hold on to extra pounds and that means when you start exercising, your body senses the deficit and ramps up its hunger signals. If you're not diligent, you'll eat everything you burn and then some. Use it to lose it. Cardio gets all the exercise glory, but strength and interval training are the real heroes. They help you build lean muscle, which in turn increases your metabolism and calorie-burning ability 3. Don't Overreact to Mild Hunger Some people have a hard time losing weight because of hunger anxiety. To them, being hungry is bad-something to be avoided at all costs-so they carry snacks with them and eat when they don't need to. Others eat because they're stressed out or bored. While you never want to get to the point of being ravenous (that's when  bingeing is likely to happen), a hunger pang, a craving, or the fact that it's 3:00 p.m. should not send you racing for the vending machine or obsessing about the energy bar in your purse. Ideally, you should put off eating until your stomach is growling and it's difficult to concentrate.  Use it to lose it. When you feel the urge to eat, use the HALT method. Ask yourself, Am I really hungry? Or am I angry or anxious, lonely or bored, or tired? If you're still not certain, try the apple test. If you're truly hungry, an apple should seem delicious; if it doesn't, something else is going on. Or you can try drinking water and making yourself busy, if you are still hungry try a healthy snack.  4. Not All Calories Are Created Equal The mechanics of weight loss are pretty simple: Take in fewer calories than you use for energy. But the kind of food you eat makes all the difference. Processed food that's high in saturated fat and refined starch or sugar can cause inflammation that disrupts the hormone signals that tell your brain you're full. The result: You eat a lot more.  Use it to lose it. Clean up your diet. Swap in whole, unprocessed foods, including vegetables, lean protein, and healthy fats that will fill you up and give you the biggest nutritional bang for your calorie buck. In a few weeks, as your brain starts receiving regular hunger and fullness signals once again, you'll notice that you feel less hungry overall and naturally start cutting back on the amount you eat.  5. Protein, Produce, and Plant-Based Fats Are Your Weight-Loss Trinity Here's why eating the three Ps regularly will help you drop pounds. Protein fills you up. You need it to build lean muscle, which keeps your metabolism humming so that you can torch more fat. People in a weight-loss program who ate double the recommended daily allowance for protein (about 110 grams for a 150-pound woman) lost 70 percent of their weight from fat, while  people who ate the RDA lost only about 40 percent, one study found. Produce is packed with filling fiber. "It's very difficult to consume too many calories  if you're eating a lot of vegetables. Example: Three cups of broccoli is a lot of food, yet only 93 calories. (Fruit is another story. It can be easy to overeat and can contain a lot of calories from sugar, so be sure to monitor your intake.) Plant-based fats like olive oil and those in avocados and nuts are healthy and extra satiating.  Use it to lose it. Aim to incorporate each of the three Ps into every meal and snack. People who eat protein throughout the day are able to keep weight off, according to a study in the [place] of Clinical Nutrition. In addition to meat, poultry and seafood, good sources are beans, lentils, eggs, tofu, and yogurt. As for fat, keep portion sizes in check by measuring out salad dressing, oil, and nut butters (shoot for one to two tablespoons). Finally, eat veggies or a little fruit at every meal. People who did that consumed 308 fewer calories but didn't feel any hungrier than when they didn't eat more produce.  7. How You Eat Is As Important As What You Eat In order for your brain to register that you're full, you need to focus on what you're eating. Sit down whenever you eat, preferably at a table. Turn off the TV or computer, put down your phone, and look at your food. Smell it. Chew slowly, and don't put another bite on your fork until you swallow. When women ate lunch this attentively, they consumed 30 percent less when snacking later than those who listened to an audiobook at lunchtime, according to a study in the [place] of Nutrition. 8. Weighing Yourself Really Works The scale provides the best evidence about whether your efforts are paying off. Seeing the numbers tick up or down or stagnate is motivation to keep going-or to rethink your approach. A 2015 study at [facility] found that daily  weigh-ins helped people lose more weight, keep it off, and maintain that loss, even after two years. Use it to lose it. Step on the scale at the same time every day for the best results. If your weight shoots up several pounds from one weigh-in to the next, don't freak out. Eating a lot of salt the night before or having your period is the likely culprit. The number should return to normal in a day or two. It's a steady climb that you need to do something about. 9. Too Much Stress and Too Little Sleep Are Your Enemies When you're tired and frazzled, your body cranks up the production of cortisol, the stress hormone that can cause carb cravings. Not getting enough sleep also boosts your levels of ghrelin, a hormone associated with hunger, while suppressing leptin, a hormone that signals fullness and satiety. People on a diet who slept only five and a half hours a night for two weeks lost 55 percent less fat and were hungrier than those who slept eight and a half hours, according to a study in the [place]. Use it to lose it. Prioritize sleep, aiming for seven hours or more a night, which research shows helps lower stress. And make sure you're getting quality zzz's. If a snoring spouse or a fidgety cat wakes you up frequently throughout the night, you may end up getting the equivalent of just four hours of sleep, according to a study from [facility]. Keep pets out of the bedroom, and use a white-noise app to drown out snoring. 10. You Will Hit a plateau-And You  Can Bust Through It As you slim down, your body releases much less leptin, the fullness hormone.  If you're not strength training, start right now. Building muscle can raise your metabolism to help you overcome a plateau. To keep your body challenged and burning calories, incorporate new moves and more intense intervals into your workouts or add another sweat session to your weekly routine. Alternatively, cut an extra  100 calories or so a day from your diet. Now that you've lost weight, your body simply doesn't need as much fuel.   Ways to cut 100 calories  1. Eat your eggs with hot sauce OR salsa instead of cheese.  Eggs are great for breakfast, but many people consider eggs and cheese to be BFFs. Instead of cheese-1 oz. of cheddar has 114 calories-top your eggs with hot sauce, which contains no calories and helps with satiety and metabolism. Salsa is also a great option!!  2. Top your toast, waffles or pancakes with mashed berries instead of jelly or syrup. Half a cup of berries-fresh, frozen or thawed-has about 40 calories, compared with 2 tbsp. of maple syrup or jelly, which both have about 100 calories. The berries will also give you a good punch of fiber, which helps keep you full and satisfied and won't spike blood sugar quickly like the jelly or syrup. 3. Swap the non-fat latte for black coffee with a splash of half-and-half. Contrary to its name, that non-fat latte has 130 calories and a startling 19g of carbohydrates per 16 oz. serving. Replacing that 'light' drinkable dessert with a black coffee with a splash of half-and-half saves you more than 100 calories per 16 oz. serving. 4. Sprinkle salads with freeze-dried raspberries instead of dried cranberries. If you want a sweet addition to your nutritious salad, stay away from dried cranberries. They have a whopping 130 calories per  cup and 30g carbohydrates. Instead, sprinkle freeze-dried raspberries guilt-free and save more than 100 calories per  cup serving, adding 3g of belly-filling fiber. 5. Go for mustard in place of mayo on your sandwich. Mustard can add really nice flavor to any sandwich, and there are tons of varieties, from spicy to honey. A serving of mayo is 95 calories, versus 10 calories in a serving of mustard. 6. Choose a DIY salad dressing instead of the store-bought kind. Mix Dijon or whole grain mustard with low-fat Kefir or red wine  vinegar and garlic. 7. Use hummus as a spread instead of a dip. Use hummus as a spread on a high-fiber cracker or tortilla with a sandwich and save on calories without sacrificing taste. 8. Pick just one salad "accessory." Salad isn't automatically a calorie winner. It's easy to over-accessorize with toppings. Instead of topping your salad with nuts, avocado and cranberries (all three will clock in at 313 calories), just pick one. The next day, choose a different accessory, which will also keep your salad interesting. You don't wear all your jewelry every day, right? 9. Ditch the white pasta in favor of spaghetti squash. One cup of cooked spaghetti squash has about 40 calories, compared with traditional spaghetti, which comes with more than 200. Spaghetti squash is also nutrient-dense. It's a good source of fiber and Vitamins A and C, and it can be eaten just like you would eat pasta-with a great tomato sauce and [place] meatballs or with pesto, tofu and spinach, for example. 10. Dress up your chili, soups and stews with non-fat Greek yogurt instead of sour cream. Just a 'dollop' of  sour cream can set you back 115 calories and a whopping 12g of fat-seven of which are of the artery-clogging variety. Added bonus: [place] yogurt is packed with muscle-building protein, calcium and B Vitamins. 11. Mash cauliflower instead of mashed potatoes. One cup of traditional mashed potatoes-in all their creamy goodness-has more than 200 calories, compared to mashed cauliflower, which you can typically eat for less than 100 calories per 1 cup serving. Cauliflower is a great source of the antioxidant indole-3-carbinol (I3C), which may help reduce the risk of some cancers, like breast cancer. 12. Ditch the ice cream sundae in favor of a [place] yogurt parfait. Instead of a cup of ice cream or fro-yo for dessert, try 1 cup of nonfat Greek yogurt topped with fresh berries and a sprinkle of cacao nibs. Both toppings are packed  with antioxidants, which can help reduce cellular inflammation and oxidative damage. And the comparison is a no-brainer: One cup of ice cream has about 275 calories; one cup of frozen yogurt has about 230; and a cup of Greek yogurt has just 130, plus twice the protein, so you're less likely to return to the freezer for a second helping. 13. Put olive oil in a spray container instead of using it directly from the bottle. Each tablespoon of olive oil is 120 calories and 15g of fat. Use a mister instead of pouring it straight into the pan or onto a salad. This allows for portion control and will save you more than 100 calories. 14. When baking, substitute canned pumpkin for butter or oil. Canned pumpkin-not pumpkin pie mix-is loaded with Vitamin A, which is important for skin and eye health, as well as immunity. And the comparisons are pretty crazy:  cup of canned pumpkin has about 40 calories, compared to butter or oil, which has more than 800 calories. Yes, 800 calories. Applesauce and mashed banana can also serve as good substitutions for butter or oil, usually in a 1:1 ratio. 15. Top casseroles with high-fiber cereal instead of breadcrumbs. Breadcrumbs are typically made with white bread, while breakfast cereals contain 5-9g of fiber per serving. Not only will you save more than 150 calories per  cup serving, the swap will also keep you more full and you'll get a metabolism boost from the added fiber. 16. Snack on pistachios instead of macadamia nuts. Believe it or not, you get the same amount of calories from 35 pistachios (100 calories) as you would from only five macadamia nuts. 17. Chow down on kale chips rather than potato chips. This is my favorite 'don't knock it 'till you try it' swap. Kale chips are so easy to make at home, and you can spice them up with a little grated parmesan or chili powder. Plus, they're a mere fraction of the calories of potato chips, but with the same crunch factor we  crave so often. 18. Add seltzer and some fruit slices to your cocktail instead of soda or fruit juice. One cup of soda or fruit juice can pack on as much as 140 calories. Instead, use seltzer and fruit slices. The fruit provides valuable phytochemicals, such as flavonoids and anthocyanins, which help to combat cancer and stave off the aging process.

## 2014-09-11 NOTE — Progress Notes (Signed)
Assessment and Plan:  1. Hypertension -Continue medication, monitor blood pressure at home. Continue DASH diet.  Reminder to go to the ER if any CP, SOB, nausea, dizziness, severe HA, changes vision/speech, left arm numbness and tingling and jaw pain.  2. Cholesterol -Continue diet and exercise. Check cholesterol.   3. Prediabetes  -Continue diet and exercise. Check A1C  4. Vitamin D Def - check level and continue medications.   5. Obesity with co morbidities-  long discussion about weight loss, diet, and exercise  6. Hypogonadism-  continue replacement therapy, check testosterone levels as needed.    Continue diet and meds as discussed. Further disposition pending results of labs. Over 30 minutes of exam, counseling, chart review, and critical decision making was performed  HPI 49 y.o. male  presents for 3 month follow up on hypertension, cholesterol, prediabetes, and vitamin D deficiency.   His blood pressure has been controlled at home, today their BP is BP: 130/78 mmHg  He does workout, runs 2-3 x a week. He denies chest pain, shortness of breath, dizziness.  He is on cholesterol medication and denies myalgias. His cholesterol is at goal. The cholesterol last visit was:   Lab Results  Component Value Date   CHOL 190 06/04/2014   HDL 34* 06/04/2014   LDLCALC 99 06/04/2014   TRIG 285* 06/04/2014   CHOLHDL 5.6 06/04/2014   He has been working on diet and exercise for prediabetes with unsulin resistance, he has not started the metformin, and denies paresthesia of the feet, polydipsia, polyuria and visual disturbances. Last A1C in the office was:  Lab Results  Component Value Date   HGBA1C 6.0* 06/04/2014  Patient is on Vitamin D supplement.   Lab Results  Component Value Date   VD25OH 24* 06/04/2014    He has a history of testosterone deficiency and is on testosterone replacement, does shot every 2 weeks but has not had for 4 weeks. He states that the testosterone helps  with his energy, libido, muscle mass. Lab Results  Component Value Date   TESTOSTERONE 918* 06/04/2014  BMI is Body mass index is 37.55 kg/(m^2)., he is working on diet and exercise. Wt Readings from Last 3 Encounters:  09/11/14 273 lb (123.832 kg)  06/04/14 273 lb 3.2 oz (123.923 kg)  03/04/14 269 lb (122.018 kg)     Current Medications:  Current Outpatient Prescriptions on File Prior to Visit  Medication Sig Dispense Refill  . aspirin 81 MG tablet Take 81 mg by mouth daily.    Marland Kitchen atenolol (TENORMIN) 100 MG tablet Take 100 mg by mouth daily. Takes 1 whole tab daily    . Cholecalciferol (VITAMIN D3) 5000 UNIT/ML LIQD Take 5,000 Units by mouth daily.    . enalapril (VASOTEC) 10 MG tablet Take 10 mg by mouth daily.    . hydrochlorothiazide (HYDRODIURIL) 25 MG tablet Take 1 tablet (25 mg total) by mouth daily. 90 tablet 3  . pravastatin (PRAVACHOL) 20 MG tablet Take 1 tablet (20 mg total) by mouth daily.    Marland Kitchen PROCTOSOL HC 2.5 % rectal cream     . testosterone cypionate (DEPOTESTOTERONE CYPIONATE) 200 MG/ML injection 2cc q 2 weeks 10 mL 1  . valACYclovir (VALTREX) 1000 MG tablet 1 tablet 3 x daily for cld sores / fever b;listers 30 tablet 99  . verapamil (CALAN-SR) 240 MG CR tablet Take 1 tablet daily with food for BP 90 tablet 99   No current facility-administered medications on file prior to visit.  Medical History:  Past Medical History  Diagnosis Date  . Hypertension   . Hyperlipidemia   . Other testicular hypofunction   . Unspecified vitamin D deficiency   . Depression   . Obese 08/05/2013   Allergies:  Allergies  Allergen Reactions  . Zocor [Simvastatin] Other (See Comments)    cpk elev  . Crestor [Rosuvastatin] Palpitations     Review of Systems:  Review of Systems  Constitutional: Negative.   HENT: Negative.   Eyes: Negative.   Respiratory: Negative.   Cardiovascular: Negative.   Gastrointestinal: Negative.   Genitourinary: Negative.   Musculoskeletal:  Negative.   Skin: Negative.   Neurological: Negative.   Endo/Heme/Allergies: Negative.   Psychiatric/Behavioral: Negative.     Family history- Review and unchanged Social history- Review and unchanged Physical Exam: BP 130/78 mmHg  Pulse 60  Temp(Src) 97.7 F (36.5 C)  Resp 16  Ht 5' 11.5" (1.816 m)  Wt 273 lb (123.832 kg)  BMI 37.55 kg/m2 Wt Readings from Last 3 Encounters:  09/11/14 273 lb (123.832 kg)  06/04/14 273 lb 3.2 oz (123.923 kg)  03/04/14 269 lb (122.018 kg)   General Appearance: Well nourished, in no apparent distress. Eyes: PERRLA, EOMs, conjunctiva no swelling or erythema Sinuses: No Frontal/maxillary tenderness ENT/Mouth: Ext aud canals clear, TMs without erythema, bulging. No erythema, swelling, or exudate on post pharynx.  Tonsils not swollen or erythematous. Hearing normal.  Neck: Supple, thyroid normal.  Respiratory: Respiratory effort normal, BS equal bilaterally without rales, rhonchi, wheezing or stridor.  Cardio: RRR with no MRGs. Brisk peripheral pulses without edema.  Abdomen: Soft, + BS,  Non tender, no guarding, rebound, hernias, masses. Lymphatics: Non tender without lymphadenopathy.  Musculoskeletal: Full ROM, 5/5 strength, Normal gait Skin: Warm, dry without rashes, lesions, ecchymosis.  Neuro: Cranial nerves intact. Normal muscle tone, no cerebellar symptoms. Psych: Awake and oriented X 3, normal affect, Insight and Judgment appropriate.    Vicie Mutters, PA-C 10:46 AM [facility]

## 2014-09-12 LAB — BASIC METABOLIC PANEL WITH GFR
BUN: 20 mg/dL (ref 6–23)
CO2: 27 mEq/L (ref 19–32)
Calcium: 9.5 mg/dL (ref 8.4–10.5)
Chloride: 103 mEq/L (ref 96–112)
Creat: 0.95 mg/dL (ref 0.50–1.35)
GFR, Est African American: >89 mL/min
GFR, Est Non African American: >89 mL/min
Glucose, Bld: 98 mg/dL (ref 70–99)
Potassium: 4.3 mEq/L (ref 3.5–5.3)
Sodium: 140 mEq/L (ref 135–145)

## 2014-09-12 LAB — MAGNESIUM: Magnesium: 2.3 mg/dL (ref 1.5–2.5)

## 2014-09-12 LAB — HEPATIC FUNCTION PANEL
ALT: 20 U/L (ref 0–53)
AST: 18 U/L (ref 0–37)
Albumin: 4 g/dL (ref 3.5–5.2)
Alkaline Phosphatase: 45 U/L (ref 39–117)
Bilirubin, Direct: 0.1 mg/dL (ref 0.0–0.3)
Indirect Bilirubin: 0.4 mg/dL (ref 0.2–1.2)
Total Bilirubin: 0.5 mg/dL (ref 0.2–1.2)
Total Protein: 7.4 g/dL (ref 6.0–8.3)

## 2014-09-12 LAB — TSH: TSH: 1.084 u[IU]/mL (ref 0.350–4.500)

## 2014-09-12 LAB — LIPID PANEL
Cholesterol: 180 mg/dL (ref 0–200)
HDL: 36 mg/dL — ABNORMAL LOW (ref >=40)
LDL Cholesterol: 111 mg/dL — ABNORMAL HIGH (ref 0–99)
Total CHOL/HDL Ratio: 5 Ratio
Triglycerides: 163 mg/dL — ABNORMAL HIGH (ref <150)
VLDL: 33 mg/dL (ref 0–40)

## 2014-09-12 LAB — INSULIN, FASTING: Insulin fasting, serum: 8.9 u[IU]/mL (ref 2.0–19.6)

## 2014-09-12 LAB — VITAMIN D 25 HYDROXY (VIT D DEFICIENCY, FRACTURES): Vit D, 25-Hydroxy: 37 ng/mL (ref 30–100)

## 2014-09-30 ENCOUNTER — Other Ambulatory Visit: Payer: Self-pay | Admitting: *Deleted

## 2014-09-30 MED ORDER — VITAMIN D3 125 MCG (5000 UT) PO TABS: 5000.0000 [IU] | ORAL_TABLET | Freq: Every day | ORAL | Status: DC

## 2014-09-30 MED ORDER — HYDROCHLOROTHIAZIDE 25 MG PO TABS: 25.0000 mg | ORAL_TABLET | Freq: Every day | ORAL | Status: DC

## 2014-11-03 ENCOUNTER — Other Ambulatory Visit: Payer: Self-pay | Admitting: *Deleted

## 2014-11-03 DIAGNOSIS — E782 Mixed hyperlipidemia: Secondary | ICD-10-CM

## 2014-11-03 MED ORDER — PRAVASTATIN SODIUM 20 MG PO TABS: 20.0000 mg | ORAL_TABLET | Freq: Every day | ORAL | Status: DC

## 2014-11-04 ENCOUNTER — Other Ambulatory Visit: Payer: Self-pay | Admitting: *Deleted

## 2014-11-04 MED ORDER — VALACYCLOVIR HCL 1 G PO TABS: ORAL_TABLET | ORAL | Status: DC

## 2014-12-09 ENCOUNTER — Encounter: Payer: Self-pay | Admitting: Internal Medicine

## 2014-12-17 ENCOUNTER — Other Ambulatory Visit: Payer: Self-pay | Admitting: Internal Medicine

## 2014-12-17 DIAGNOSIS — E291 Testicular hypofunction: Secondary | ICD-10-CM

## 2014-12-17 MED ORDER — TESTOSTERONE CYPIONATE 200 MG/ML IM SOLN: INTRAMUSCULAR | Status: DC

## 2014-12-18 ENCOUNTER — Other Ambulatory Visit: Payer: Self-pay | Admitting: *Deleted

## 2014-12-18 DIAGNOSIS — E291 Testicular hypofunction: Secondary | ICD-10-CM

## 2014-12-18 MED ORDER — TESTOSTERONE CYPIONATE 200 MG/ML IM SOLN: INTRAMUSCULAR | Status: DC

## 2014-12-23 ENCOUNTER — Encounter: Payer: Self-pay | Admitting: Internal Medicine

## 2014-12-23 NOTE — Patient Instructions (Addendum)

## 2014-12-23 NOTE — Progress Notes (Signed)
Patient ID: Nicholas Crosby, male   DOB: Jun 28, 1965, 49 y.o.   MRN: [id number]   Comprehensive Examination  This very nice 49 y.o. [name] presents for complete physical.  Patient has been followed for HTN, Prediabetes, Hyperlipidemia, Testosterone and Vitamin D Deficiency.   HTN predates since 61. Patient's BP has been controlled at home.Today's BP: (!) 142/90 mmHg. Patient denies any cardiac symptoms as chest pain, palpitations, shortness of breath, dizziness or ankle swelling.   Patient's hyperlipidemia is controlled with diet and medications. Patient denies myalgias or other medication SE's. Last lipids were not at goal - Cholesterol 180; HDL 36*; LDL 111*; Triglycerides 163 on 09/11/2014.   Patient has Morbid Obesity (BMI 39.35) and consequent prediabetes and  in 2012  A1c was 6.4% and A1c then was down to 5.8% in 2014. Patient denies reactive hypoglycemic symptoms, visual blurring, diabetic polys or paresthesias. Last A1c was 5.9% on 09/11/2014.    In 2012 patient's testosterone measured 191 and in Aug 2014 it was 150. Patient has been on replacement therapy with improved sense of stamina, endurance & well-being. Finally, patient has history of Vitamin D Deficiency of 21 in 2008 and patient sporadically takes replacement doses with last vitamin D still very  low at 37 on 09/11/2014.   Medication Sig  . aspirin 81 MG tablet Take 81 mg by mouth daily.  Marland Kitchen atenolol  100 MG tablet Take 100 mg by mouth daily. Takes 1 whole tab daily  . VITAMIN D 5000 UNITS  Take 1 tablet (5,000 Units total) by mouth daily.  . enalapril (VASOTEC) 10 MG  Take 10 mg by mouth daily.  . hctz 25 MG  Take 1 tablet (25 mg total) by mouth daily.  . pravastatin  20 MG  Take 1 tablet (20 mg total) by mouth daily.  Marland Kitchen PROCTOSOL HC 2.5 % rectal cream   . DEPO-TESTOSTERONE  200 MG/ML inj 2cc q 2 weeks and needs 3 cc syringes And 1" 21 G needles  . valACYclovir 1000 MG tablet 1 tablet 3 x daily for cld sores / fever b;listers  .  verapamil -SR 240 MG  Take 1 tablet daily with food for BP    Allergies  Allergen Reactions  . Zocor [Simvastatin] Other (See Comments)    cpk elev  . Crestor [Rosuvastatin] Palpitations   Past Medical History  Diagnosis Date  . Hypertension   . Hyperlipidemia   . Depression    Health Maintenance  Topic Date Due  . PNEUMOCOCCAL POLYSACCHARIDE VACCINE (1) 11/11/1968  . FOOT EXAM  11/11/1976  . OPHTHALMOLOGY EXAM  11/11/1976  . INFLUENZA VACCINE  11/17/2014  . URINE MICROALBUMIN  12/04/2014  . HEMOGLOBIN A1C  03/14/2015  . TETANUS/TDAP  01/08/2017  . HIV Screening  Completed   Immunization History  Administered Date(s) Administered  . Tdap 01/09/2007   Past Surgical History  Procedure Laterality Date  . No past surgeries    . Vasectomy  04/06/2012    Procedure: VASECTOMY;  Surgeon: Alexis Frock, MD;  Location: [facility];  Service: Urology;  Laterality: Bilateral;  . Cosmetic surgery      liposuction  . Cyst removal neck     Family History  Problem Relation Age of Onset  . Hypertension Mother   . Hyperlipidemia Mother   . Diabetes Mother   . Cancer Mother     breast  . Heart disease Father   . Hyperlipidemia Father   . Hypertension Father    Social History  Social History  . Marital Status: Single    Spouse Name: N/A  . Number of Children: N/A  . Years of Education: N/A   Occupational History  . Not on file.   Social History Main Topics  . Smoking status: Never Smoker   . Smokeless tobacco: Not on file  . Alcohol Use: 1.2 oz/week    2 Cans of beer per week     Comment: social  . Drug Use: No  . Sexual Activity: Active    ROS Constitutional: Denies fever, chills, weight loss/gain, headaches, insomnia,  night sweats or change in appetite. Does c/o fatigue. Eyes: Denies redness, blurred vision, diplopia, discharge, itchy or watery eyes.  ENT: Denies discharge, congestion, post nasal drip, epistaxis, sore throat, earache, hearing  loss, dental pain, Tinnitus, Vertigo, Sinus pain or snoring.  Cardio: Denies chest pain, palpitations, irregular heartbeat, syncope, dyspnea, diaphoresis, orthopnea, PND, claudication or edema Respiratory: denies cough, dyspnea, DOE, pleurisy, hoarseness, laryngitis or wheezing.  Gastrointestinal: Denies dysphagia, heartburn, reflux, water brash, pain, cramps, nausea, vomiting, bloating, diarrhea, constipation, hematemesis, melena, hematochezia, jaundice or hemorrhoids Genitourinary: Denies dysuria, frequency, urgency, nocturia, hesitancy, discharge, hematuria or flank pain Musculoskeletal: Denies arthralgia, myalgia, stiffness, Jt. Swelling, pain, limp or strain/sprain. Denies Falls. Skin: Denies puritis, rash, hives, warts, acne, eczema or change in skin lesion Neuro: No weakness, tremor, incoordination, spasms, paresthesia or pain Psychiatric: Denies confusion, memory loss or sensory loss. Denies Depression. Endocrine: Denies change in weight, skin, hair change, nocturia, and paresthesia, diabetic polys, visual blurring or hyper / hypo glycemic episodes.  Heme/Lymph: No excessive bleeding, bruising or enlarged lymph nodes.  Physical Exam  BP 142/90 mmHg  Pulse 68  Temp(Src) 97.5 F (36.4 C)  Resp 16  Ht 5\' 11"  (1.803 m)  Wt 280 lb 9.6 oz (127.279 kg)  BMI 39.15 kg/m2  General Appearance: Well nourished &  in no apparent distress. Eyes: PERRLA, EOMs, conjunctiva no swelling or erythema, normal fundi and vessels. Sinuses: No frontal/maxillary tenderness ENT/Mouth: EACs patent / TMs  nl. Nares clear without erythema, swelling, mucoid exudates. Oral hygiene is good. No erythema, swelling, or exudate. Tongue normal, non-obstructing. Tonsils not swollen or erythematous. Hearing normal.  Neck: Supple, thyroid normal. No bruits, nodes or JVD. Respiratory: Respiratory effort normal.  BS equal and clear bilateral without rales, rhonci, wheezing or stridor. Cardio: Heart sounds are normal with  regular rate and rhythm and no murmurs, rubs or gallops. Peripheral pulses are normal and equal bilaterally without edema. No aortic or femoral bruits. Chest: symmetric with normal excursions and percussion.  Abdomen: Flat, soft, with bowel sounds. Nontender, no guarding, rebound, hernias, masses, or organomegaly.  Lymphatics: Non tender without lymphadenopathy.  Genitourinary: No hernias.Testes nl. DRE - prostate nl for age - smooth & firm w/o nodules. Musculoskeletal: Full ROM all peripheral extremities, joint stability, 5/5 strength, and normal gait. Skin: Warm and dry without rashes, lesions, cyanosis, clubbing or  ecchymosis.  Neuro: Cranial nerves intact, reflexes equal bilaterally. Normal muscle tone, no cerebellar symptoms. Sensation intact.  Pysch: Alert and oriented X 3 with normal affect, insight and judgment appropriate.   Assessment and Plan  1. Essential hypertension  - Microalbumin / creatinine urine ratio - EKG 12-Lead - TSH  2. Hyperlipidemia  - Lipid panel  3. Prediabetes  - Hemoglobin A1c - Insulin, random  4. Vitamin D deficiency  - Vit D  25 hydroxy   5. Testosterone deficiency  - Testosterone  6. Screening for rectal cancer  - POC Hemoccult Bld/Stl  7. Prostate cancer screening  - PSA  8. Other fatigue  - Vitamin B12 - Testosterone - Iron and TIBC - TSH  9. Medication management  - CBC with Differential/Platelet - BASIC METABOLIC PANEL WITH GFR - Hepatic function panel - Magnesium  10. BMI 37.0-37.9, adult   Continue prudent diet as discussed, weight control, BP monitoring, regular exercise, and medications as discussed.  Discussed med effects and SE's. Routine screening labs and tests as requested with regular follow-up as recommended.  Over 40 minutes of exam, counseling &  chart review was performed

## 2014-12-24 ENCOUNTER — Encounter: Payer: Self-pay | Admitting: Internal Medicine

## 2014-12-24 ENCOUNTER — Ambulatory Visit (INDEPENDENT_AMBULATORY_CARE_PROVIDER_SITE_OTHER): Payer: Managed Care, Other (non HMO) | Admitting: Internal Medicine

## 2014-12-24 VITALS — BP 142/90 | HR 68 | Temp 97.5°F | Resp 16 | Ht 71.0 in | Wt 280.6 lb

## 2014-12-24 DIAGNOSIS — R7303 Prediabetes: Secondary | ICD-10-CM

## 2014-12-24 DIAGNOSIS — Z0000 Encounter for general adult medical examination without abnormal findings: Secondary | ICD-10-CM | POA: Diagnosis not present

## 2014-12-24 DIAGNOSIS — E559 Vitamin D deficiency, unspecified: Secondary | ICD-10-CM

## 2014-12-24 DIAGNOSIS — Z79899 Other long term (current) drug therapy: Secondary | ICD-10-CM

## 2014-12-24 DIAGNOSIS — Z125 Encounter for screening for malignant neoplasm of prostate: Secondary | ICD-10-CM | POA: Diagnosis not present

## 2014-12-24 DIAGNOSIS — Z6837 Body mass index (BMI) 37.0-37.9, adult: Secondary | ICD-10-CM

## 2014-12-24 DIAGNOSIS — I10 Essential (primary) hypertension: Secondary | ICD-10-CM | POA: Diagnosis not present

## 2014-12-24 DIAGNOSIS — E349 Endocrine disorder, unspecified: Secondary | ICD-10-CM

## 2014-12-24 DIAGNOSIS — Z1212 Encounter for screening for malignant neoplasm of rectum: Secondary | ICD-10-CM

## 2014-12-24 DIAGNOSIS — R5383 Other fatigue: Secondary | ICD-10-CM

## 2014-12-24 DIAGNOSIS — E785 Hyperlipidemia, unspecified: Secondary | ICD-10-CM

## 2014-12-24 DIAGNOSIS — R7309 Other abnormal glucose: Secondary | ICD-10-CM

## 2014-12-24 LAB — HEPATIC FUNCTION PANEL
ALT: 23 U/L (ref 9–46)
AST: 23 U/L (ref 10–40)
Albumin: 4.2 g/dL (ref 3.6–5.1)
Alkaline Phosphatase: 40 U/L (ref 40–115)
Bilirubin, Direct: 0.1 mg/dL (ref <=0.2)
Indirect Bilirubin: 0.3 mg/dL (ref 0.2–1.2)
Total Bilirubin: 0.4 mg/dL (ref 0.2–1.2)
Total Protein: 7 g/dL (ref 6.1–8.1)

## 2014-12-24 LAB — CBC WITH DIFFERENTIAL/PLATELET
Basophils Absolute: 0 10*3/uL (ref 0.0–0.1)
Basophils Relative: 0 % (ref 0–1)
Eosinophils Absolute: 0.3 10*3/uL (ref 0.0–0.7)
Eosinophils Relative: 3 % (ref 0–5)
HCT: 44.4 % (ref 39.0–52.0)
Hemoglobin: 14.8 g/dL (ref 13.0–17.0)
Lymphocytes Relative: 27 % (ref 12–46)
Lymphs Abs: 2.5 10*3/uL (ref 0.7–4.0)
MCH: 27.9 pg (ref 26.0–34.0)
MCHC: 33.3 g/dL (ref 30.0–36.0)
MCV: 83.6 fL (ref 78.0–100.0)
MPV: 10.3 fL (ref 8.6–12.4)
Monocytes Absolute: 0.7 10*3/uL (ref 0.1–1.0)
Monocytes Relative: 8 % (ref 3–12)
Neutro Abs: 5.8 10*3/uL (ref 1.7–7.7)
Neutrophils Relative %: 62 % (ref 43–77)
Platelets: 296 10*3/uL (ref 150–400)
RBC: 5.31 MIL/uL (ref 4.22–5.81)
RDW: 15.1 % (ref 11.5–15.5)
WBC: 9.3 10*3/uL (ref 4.0–10.5)

## 2014-12-24 LAB — BASIC METABOLIC PANEL WITH GFR
BUN: 13 mg/dL (ref 7–25)
CO2: 28 mmol/L (ref 20–31)
Calcium: 9.4 mg/dL (ref 8.6–10.3)
Chloride: 100 mmol/L (ref 98–110)
Creat: 0.88 mg/dL (ref 0.60–1.35)
GFR, Est African American: >89 mL/min (ref >=60)
GFR, Est Non African American: >89 mL/min (ref >=60)
Glucose, Bld: 106 mg/dL — ABNORMAL HIGH (ref 65–99)
Potassium: 4.2 mmol/L (ref 3.5–5.3)
Sodium: 140 mmol/L (ref 135–146)

## 2014-12-24 LAB — LIPID PANEL
Cholesterol: 180 mg/dL (ref 125–200)
HDL: 29 mg/dL — ABNORMAL LOW (ref >=40)
LDL Cholesterol: 97 mg/dL (ref <130)
Total CHOL/HDL Ratio: 6.2 Ratio — ABNORMAL HIGH (ref <=5.0)
Triglycerides: 272 mg/dL — ABNORMAL HIGH (ref <150)
VLDL: 54 mg/dL — ABNORMAL HIGH (ref <30)

## 2014-12-24 LAB — IRON AND TIBC
%SAT: 22 % (ref 15–60)
Iron: 70 ug/dL (ref 50–180)
TIBC: 315 ug/dL (ref 250–425)
UIBC: 245 ug/dL (ref 125–400)

## 2014-12-24 LAB — TSH: TSH: 1.947 u[IU]/mL (ref 0.350–4.500)

## 2014-12-24 LAB — MAGNESIUM: Magnesium: 2 mg/dL (ref 1.5–2.5)

## 2014-12-24 LAB — VITAMIN B12: Vitamin B-12: 420 pg/mL (ref 211–911)

## 2014-12-25 LAB — HEMOGLOBIN A1C
Hgb A1c MFr Bld: 5.8 % — ABNORMAL HIGH (ref <5.7)
Mean Plasma Glucose: 120 mg/dL — ABNORMAL HIGH (ref <117)

## 2014-12-25 LAB — MICROALBUMIN / CREATININE URINE RATIO
Creatinine, Urine: 92.8 mg/dL
Microalb Creat Ratio: 2.2 mg/g (ref 0.0–30.0)
Microalb, Ur: 0.2 mg/dL (ref <2.0)

## 2014-12-25 LAB — VITAMIN D 25 HYDROXY (VIT D DEFICIENCY, FRACTURES): Vit D, 25-Hydroxy: 34 ng/mL (ref 30–100)

## 2014-12-25 LAB — INSULIN, RANDOM: Insulin: 16.2 u[IU]/mL (ref 2.0–19.6)

## 2014-12-25 LAB — TESTOSTERONE: Testosterone: 756 ng/dL (ref 300–890)

## 2014-12-25 LAB — PSA: PSA: 0.59 ng/mL (ref <=4.00)

## 2015-01-04 ENCOUNTER — Other Ambulatory Visit: Payer: Self-pay | Admitting: Internal Medicine

## 2015-01-05 MED ORDER — ATENOLOL 100 MG PO TABS: 100.0000 mg | ORAL_TABLET | Freq: Every day | ORAL | Status: DC

## 2015-03-02 ENCOUNTER — Ambulatory Visit: Payer: Self-pay | Admitting: Internal Medicine

## 2015-03-02 ENCOUNTER — Encounter: Payer: Self-pay | Admitting: Internal Medicine

## 2015-03-02 ENCOUNTER — Ambulatory Visit (INDEPENDENT_AMBULATORY_CARE_PROVIDER_SITE_OTHER): Payer: Managed Care, Other (non HMO) | Admitting: Internal Medicine

## 2015-03-02 VITALS — BP 158/102 | HR 60 | Temp 97.5°F | Resp 16 | Ht 71.0 in | Wt 278.8 lb

## 2015-03-02 DIAGNOSIS — I10 Essential (primary) hypertension: Secondary | ICD-10-CM

## 2015-03-02 DIAGNOSIS — E782 Mixed hyperlipidemia: Secondary | ICD-10-CM

## 2015-03-02 DIAGNOSIS — R7303 Prediabetes: Secondary | ICD-10-CM

## 2015-03-02 DIAGNOSIS — Z6838 Body mass index (BMI) 38.0-38.9, adult: Secondary | ICD-10-CM | POA: Diagnosis not present

## 2015-03-02 MED ORDER — PRAVASTATIN SODIUM 20 MG PO TABS: 20.0000 mg | ORAL_TABLET | Freq: Every day | ORAL | Status: DC

## 2015-03-02 MED ORDER — MINOXIDIL 10 MG PO TABS: ORAL_TABLET | ORAL | Status: DC

## 2015-03-02 NOTE — Progress Notes (Signed)
  Subjective:    Patient ID: Nicholas Crosby, male    DOB: 03-Nov-1965, 49 y.o.   MRN: [id number]  HPI Very nice 49 yo [name] on poly pharmacy for severe HTN presents with list of BP's ranging 120-170 / 70-105 with only sx of mild HA. Denies CP,palpitations, dyspnea or dependent edema. Denies unusual stressors.   Medication Sig  . aspirin 81 MG tablet Take 81 mg by mouth daily.  Marland Kitchen atenolol (TENORMIN) 100 MG tablet Take 1 tablet (100 mg total) by mouth daily. Takes 1 whole tab daily  . Cholecalciferol (VITAMIN D3) 5000 UNITS TABS Take 1 tablet (5,000 Units total) by mouth daily.  . enalapril (VASOTEC) 10 MG tablet Take 10 mg by mouth daily.  . hydrochlorothiazide (HYDRODIURIL) 25 MG tablet Take 1 tablet (25 mg total) by mouth daily.  Marland Kitchen PROCTOSOL HC 2.5 % rectal cream   . testosterone cypionate (DEPOTESTOSTERONE CYPIONATE) 200 MG/ML injection 2cc q 2 weeks and needs 3 cc syringes And 1" 21 G needles  . valACYclovir (VALTREX) 1000 MG tablet 1 tablet 3 x daily for cld sores / fever b;listers  . pravastatin (PRAVACHOL) 20 MG tablet Take 1 tablet (20 mg total) by mouth daily.   Allergies  Allergen Reactions  . Zocor [Simvastatin] Other (See Comments)    cpk elev  . Crestor [Rosuvastatin] Palpitations    Past Medical History  Diagnosis Date  . Hypertension   . Hyperlipidemia   . Depression     Past Surgical History  Procedure Laterality Date  . No past surgeries    . Vasectomy  04/06/2012    Procedure: VASECTOMY;  Surgeon: Alexis Frock, MD;  Location: [facility];  Service: Urology;  Laterality: Bilateral;  . Cosmetic surgery      liposuction  . Cyst removal neck     Review of Systems  10 point systems review negative except as above.    Objective:   Physical Exam   BP 158/102 mmHg  Pulse 60  Temp(Src) 97.5 F (36.4 C)  Resp 16  Ht 5\' 11"  (1.803 m)  Wt 278 lb 12.8 oz (126.463 kg)  BMI 38.90 kg/m2  HEENT - Eac's patent. TM's Nl. EOM's full. PERRLA.  NasoOroPharynx clear. Neck - supple. Nl Thyroid. Carotids 2+ & No bruits, nodes, JVD Chest - Clear equal BS w/o Rales, rhonchi, wheezes. Cor - Nl HS. RRR w/o sig MGR. PP 1(+). No edema. Abd - No palpable organomegaly, masses or tenderness. BS nl. MS- FROM w/o deformities. Muscle power, tone and bulk Nl. Gait Nl. Neuro - No obvious Cr N abnormalities. Sensory, motor and Cerebellar functions appear Nl w/o focal abnormalities. Psyche - Mental status normal & appropriate.  No delusions, ideations or obvious mood abnormalities.    Assessment & Plan:   1. Essential hypertension  - minoxidil (LONITEN) 10 MG tablet; Take 1/2 to 1 tab daily as directed for BP  Dispense: 90 tablet; Refill: 1  - Discussed starting with 1/4 tab qd x 8 days then increasing to 1/2 tab & if BP drops surreptitiously to begin taper of Atenolol to 1/2 and then Verapamil to 1/2 or to call anytime if questions. Has 1 mo f/u in place.   2. Mixed hyperlipidemia  - pravastatin (PRAVACHOL) 20 MG tablet; Take 1 tablet (20 mg total) by mouth daily.  Dispense: 90 tablet; Refill: 1  3. BMI 38.0-38.9,adult   4. Prediabetes

## 2015-03-03 ENCOUNTER — Ambulatory Visit: Payer: Self-pay | Admitting: Internal Medicine

## 2015-03-30 ENCOUNTER — Ambulatory Visit (INDEPENDENT_AMBULATORY_CARE_PROVIDER_SITE_OTHER): Payer: Managed Care, Other (non HMO) | Admitting: Internal Medicine

## 2015-03-30 ENCOUNTER — Encounter: Payer: Self-pay | Admitting: Internal Medicine

## 2015-03-30 VITALS — BP 128/70 | HR 64 | Temp 98.2°F | Resp 18 | Ht 71.0 in | Wt 279.0 lb

## 2015-03-30 DIAGNOSIS — R7303 Prediabetes: Secondary | ICD-10-CM

## 2015-03-30 DIAGNOSIS — E785 Hyperlipidemia, unspecified: Secondary | ICD-10-CM | POA: Diagnosis not present

## 2015-03-30 DIAGNOSIS — I10 Essential (primary) hypertension: Secondary | ICD-10-CM

## 2015-03-30 DIAGNOSIS — E559 Vitamin D deficiency, unspecified: Secondary | ICD-10-CM | POA: Diagnosis not present

## 2015-03-30 DIAGNOSIS — Z79899 Other long term (current) drug therapy: Secondary | ICD-10-CM | POA: Diagnosis not present

## 2015-03-30 LAB — BASIC METABOLIC PANEL WITH GFR
BUN: 12 mg/dL (ref 7–25)
CO2: 30 mmol/L (ref 20–31)
Calcium: 9.1 mg/dL (ref 8.6–10.3)
Chloride: 100 mmol/L (ref 98–110)
Creat: 0.85 mg/dL (ref 0.60–1.35)
GFR, Est African American: >89 mL/min (ref >=60)
GFR, Est Non African American: >89 mL/min (ref >=60)
Glucose, Bld: 101 mg/dL — ABNORMAL HIGH (ref 65–99)
Potassium: 4.2 mmol/L (ref 3.5–5.3)
Sodium: 138 mmol/L (ref 135–146)

## 2015-03-30 LAB — CBC WITH DIFFERENTIAL/PLATELET
Basophils Absolute: 0.1 10*3/uL (ref 0.0–0.1)
Basophils Relative: 1 % (ref 0–1)
Eosinophils Absolute: 0.1 10*3/uL (ref 0.0–0.7)
Eosinophils Relative: 2 % (ref 0–5)
HCT: 43.3 % (ref 39.0–52.0)
Hemoglobin: 15.4 g/dL (ref 13.0–17.0)
Lymphocytes Relative: 33 % (ref 12–46)
Lymphs Abs: 2.1 10*3/uL (ref 0.7–4.0)
MCH: 27.9 pg (ref 26.0–34.0)
MCHC: 35.6 g/dL (ref 30.0–36.0)
MCV: 78.4 fL (ref 78.0–100.0)
MPV: 9.8 fL (ref 8.6–12.4)
Monocytes Absolute: 0.6 10*3/uL (ref 0.1–1.0)
Monocytes Relative: 9 % (ref 3–12)
Neutro Abs: 3.5 10*3/uL (ref 1.7–7.7)
Neutrophils Relative %: 55 % (ref 43–77)
Platelets: 301 10*3/uL (ref 150–400)
RBC: 5.52 MIL/uL (ref 4.22–5.81)
RDW: 14.3 % (ref 11.5–15.5)
WBC: 6.4 10*3/uL (ref 4.0–10.5)

## 2015-03-30 LAB — HEPATIC FUNCTION PANEL
ALT: 31 U/L (ref 9–46)
AST: 24 U/L (ref 10–40)
Albumin: 3.9 g/dL (ref 3.6–5.1)
Alkaline Phosphatase: 39 U/L — ABNORMAL LOW (ref 40–115)
Bilirubin, Direct: 0.1 mg/dL (ref <=0.2)
Indirect Bilirubin: 0.5 mg/dL (ref 0.2–1.2)
Total Bilirubin: 0.6 mg/dL (ref 0.2–1.2)
Total Protein: 7.1 g/dL (ref 6.1–8.1)

## 2015-03-30 LAB — LIPID PANEL
Cholesterol: 187 mg/dL (ref 125–200)
HDL: 35 mg/dL — ABNORMAL LOW (ref >=40)
LDL Cholesterol: 119 mg/dL (ref <130)
Total CHOL/HDL Ratio: 5.3 Ratio — ABNORMAL HIGH (ref <=5.0)
Triglycerides: 167 mg/dL — ABNORMAL HIGH (ref <150)
VLDL: 33 mg/dL — ABNORMAL HIGH (ref <30)

## 2015-03-30 NOTE — Progress Notes (Signed)
Patient ID: GILLERMO PALLER, male   DOB: 1966-02-20, 49 y.o.   MRN: [id number]  Assessment and Plan:  Hypertension:  -change cuff to wrist or forearm.  Given history think that the cuff that he is using is too small -to take log x 2 weeks and then is to go ahead and send mychart message and will change meds accordingly. -Continue medication,  -monitor blood pressure at home.  -Continue DASH diet.   -Reminder to go to the ER if any CP, SOB, nausea, dizziness, severe HA, changes vision/speech, left arm numbness and tingling, and jaw pain.  Cholesterol: -Continue diet and exercise.  -Check cholesterol.   Pre-diabetes: -Continue diet and exercise.  -Check A1C  Vitamin D Def: -check level -continue medications.   Continue diet and meds as discussed. Further disposition pending results of labs.  HPI 49 y.o. male  presents for 3 month follow up with hypertension, hyperlipidemia, prediabetes and vitamin D.   His blood pressure has been controlled at home, today their BP is BP: 128/70 mmHg.   He does workout. He denies chest pain, shortness of breath, dizziness. 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     He is on cholesterol medication and denies myalgias. His cholesterol is at goal. The cholesterol last visit was:   Lab Results  Component Value Date   CHOL 180 12/24/2014   HDL 29* 12/24/2014   LDLCALC 97 12/24/2014   TRIG 272* 12/24/2014   CHOLHDL 6.2* 12/24/2014     He has been working on diet and exercise for prediabetes, and denies foot ulcerations, hyperglycemia, hypoglycemia , increased appetite, nausea, paresthesia of the feet, polydipsia, polyuria, visual disturbances, vomiting and weight loss. Last A1C in the office was:  Lab Results  Component Value Date   HGBA1C 5.8* 12/24/2014    Patient is on Vitamin D supplement.  Lab Results  Component Value Date   VD25OH 34 12/24/2014       Current Medications:  Current Outpatient Prescriptions on File Prior  to Visit  Medication Sig Dispense Refill  . aspirin 81 MG tablet Take 81 mg by mouth daily.    Marland Kitchen atenolol (TENORMIN) 100 MG tablet Take 1 tablet (100 mg total) by mouth daily. Takes 1 whole tab daily 90 tablet 0  . Cholecalciferol (VITAMIN D3) 5000 UNITS TABS Take 1 tablet (5,000 Units total) by mouth daily. 90 tablet 3  . enalapril (VASOTEC) 10 MG tablet Take 10 mg by mouth daily.    . hydrochlorothiazide (HYDRODIURIL) 25 MG tablet Take 1 tablet (25 mg total) by mouth daily. 90 tablet 3  . minoxidil (LONITEN) 10 MG tablet Take 1/2 to 1 tab daily as directed for BP 90 tablet 1  . pravastatin (PRAVACHOL) 20 MG tablet Take 1 tablet (20 mg total) by mouth daily. 90 tablet 1  . PROCTOSOL HC 2.5 % rectal cream     . testosterone cypionate (DEPOTESTOSTERONE CYPIONATE) 200 MG/ML injection 2cc q 2 weeks and needs 3 cc syringes And 1" 21 G needles 20 mL 1  . valACYclovir (VALTREX) 1000 MG tablet 1 tablet 3 x daily for cld sores / fever b;listers 90 tablet 1   No current facility-administered medications on file prior to visit.    Medical History:  Past Medical History  Diagnosis Date  . Hypertension   . Hyperlipidemia   . Depression     Allergies:  Allergies  Allergen Reactions  . Zocor [Simvastatin] Other (See Comments)    cpk elev  .  Crestor [Rosuvastatin] Palpitations     Review of Systems:  Review of Systems  Constitutional: Negative for fever, chills and malaise/fatigue.  HENT: Negative for congestion, ear pain and sore throat.   Respiratory: Negative for cough, shortness of breath and wheezing.   Cardiovascular: Negative for chest pain, palpitations and leg swelling.  Gastrointestinal: Negative for heartburn, nausea, vomiting, diarrhea, constipation, blood in stool and melena.  Genitourinary: Negative.   Skin: Negative.   Neurological: Negative for dizziness, sensory change, loss of consciousness and headaches.  Psychiatric/Behavioral: Negative for depression. The patient is  not nervous/anxious and does not have insomnia.     Family history- Review and unchanged  Social history- Review and unchanged  Physical Exam: BP 128/70 mmHg  Pulse 64  Temp(Src) 98.2 F (36.8 C) (Temporal)  Resp 18  Ht 5\' 11"  (1.803 m)  Wt 279 lb (126.554 kg)  BMI 38.93 kg/m2 Wt Readings from Last 3 Encounters:  03/30/15 279 lb (126.554 kg)  03/02/15 278 lb 12.8 oz (126.463 kg)  12/24/14 280 lb 9.6 oz (127.279 kg)    General Appearance: Well nourished well developed, in no apparent distress. Eyes: PERRLA, EOMs, conjunctiva no swelling or erythema ENT/Mouth: Ear canals normal without obstruction, swelling, erythma, discharge.  TMs normal bilaterally.  Oropharynx moist, clear, without exudate, or postoropharyngeal swelling. Neck: Supple, thyroid normal,no cervical adenopathy  Respiratory: Respiratory effort normal, Breath sounds clear A&P without rhonchi, wheeze, or rale.  No retractions, no accessory usage. Cardio: RRR with no MRGs. Brisk peripheral pulses without edema.  Abdomen: Soft, + BS,  Non tender, no guarding, rebound, hernias, masses. Musculoskeletal: Full ROM, 5/5 strength, Normal gait Skin: Warm, dry without rashes, lesions, ecchymosis.  Neuro: Awake and oriented X 3, Cranial nerves intact. Normal muscle tone, no cerebellar symptoms. Psych: Normal affect, Insight and Judgment appropriate.    [name], PA-C 10:10 AM [facility]

## 2015-03-31 LAB — HEMOGLOBIN A1C
Hgb A1c MFr Bld: 6 % — ABNORMAL HIGH (ref <5.7)
Mean Plasma Glucose: 126 mg/dL — ABNORMAL HIGH (ref <117)

## 2015-03-31 LAB — TSH: TSH: 1.444 u[IU]/mL (ref 0.350–4.500)

## 2015-05-14 ENCOUNTER — Other Ambulatory Visit: Payer: Self-pay | Admitting: Internal Medicine

## 2015-05-14 DIAGNOSIS — I10 Essential (primary) hypertension: Secondary | ICD-10-CM

## 2015-05-14 MED ORDER — VERAPAMIL HCL ER 240 MG PO TBCR: EXTENDED_RELEASE_TABLET | ORAL | Status: DC

## 2015-05-18 ENCOUNTER — Emergency Department (HOSPITAL_COMMUNITY): Payer: Managed Care, Other (non HMO)

## 2015-05-18 ENCOUNTER — Emergency Department (HOSPITAL_COMMUNITY)
Admission: EM | Admit: 2015-05-18 | Discharge: 2015-05-18 | Disposition: A | Discharge status: Home / Self Care | Payer: Managed Care, Other (non HMO) | Attending: Emergency Medicine | Admitting: Emergency Medicine

## 2015-05-18 ENCOUNTER — Encounter (HOSPITAL_COMMUNITY): Payer: Self-pay | Admitting: Emergency Medicine

## 2015-05-18 DIAGNOSIS — Z79899 Other long term (current) drug therapy: Secondary | ICD-10-CM | POA: Diagnosis not present

## 2015-05-18 DIAGNOSIS — Z7982 Long term (current) use of aspirin: Secondary | ICD-10-CM | POA: Insufficient documentation

## 2015-05-18 DIAGNOSIS — I10 Essential (primary) hypertension: Secondary | ICD-10-CM | POA: Diagnosis not present

## 2015-05-18 DIAGNOSIS — Y9389 Activity, other specified: Secondary | ICD-10-CM | POA: Insufficient documentation

## 2015-05-18 DIAGNOSIS — I4891 Unspecified atrial fibrillation: Secondary | ICD-10-CM | POA: Diagnosis not present

## 2015-05-18 DIAGNOSIS — Z23 Encounter for immunization: Secondary | ICD-10-CM | POA: Diagnosis not present

## 2015-05-18 DIAGNOSIS — S0993XA Unspecified injury of face, initial encounter: Secondary | ICD-10-CM | POA: Diagnosis present

## 2015-05-18 DIAGNOSIS — F10920 Alcohol use, unspecified with intoxication, uncomplicated: Secondary | ICD-10-CM

## 2015-05-18 DIAGNOSIS — W1839XA Other fall on same level, initial encounter: Secondary | ICD-10-CM | POA: Diagnosis not present

## 2015-05-18 DIAGNOSIS — F329 Major depressive disorder, single episode, unspecified: Secondary | ICD-10-CM | POA: Diagnosis not present

## 2015-05-18 DIAGNOSIS — S0181XA Laceration without foreign body of other part of head, initial encounter: Secondary | ICD-10-CM | POA: Insufficient documentation

## 2015-05-18 DIAGNOSIS — Y998 Other external cause status: Secondary | ICD-10-CM | POA: Diagnosis not present

## 2015-05-18 DIAGNOSIS — Y9289 Other specified places as the place of occurrence of the external cause: Secondary | ICD-10-CM | POA: Insufficient documentation

## 2015-05-18 DIAGNOSIS — F10120 Alcohol abuse with intoxication, uncomplicated: Secondary | ICD-10-CM | POA: Diagnosis not present

## 2015-05-18 DIAGNOSIS — E785 Hyperlipidemia, unspecified: Secondary | ICD-10-CM | POA: Insufficient documentation

## 2015-05-18 DIAGNOSIS — R55 Syncope and collapse: Secondary | ICD-10-CM | POA: Diagnosis not present

## 2015-05-18 MED ORDER — SODIUM CHLORIDE 0.9 % IV BOLUS (SEPSIS)
500.0000 mL | Freq: Once | INTRAVENOUS | Status: AC
  Administered 2015-05-18: 500 mL via INTRAVENOUS

## 2015-05-18 MED ORDER — DOXYCYCLINE HYCLATE 100 MG PO CAPS: 100.0000 mg | ORAL_CAPSULE | Freq: Two times a day (BID) | ORAL | Status: DC

## 2015-05-18 MED ORDER — TETANUS-DIPHTH-ACELL PERTUSSIS 5-2.5-18.5 LF-MCG/0.5 IM SUSP
0.5000 mL | Freq: Once | INTRAMUSCULAR | Status: AC
  Administered 2015-05-18: 0.5 mL via INTRAMUSCULAR
  Filled 2015-05-18: qty 0.5

## 2015-05-18 MED ORDER — DILTIAZEM LOAD VIA INFUSION
10.0000 mg | Freq: Once | INTRAVENOUS | Status: DC
  Filled 2015-05-18: qty 10

## 2015-05-18 MED ORDER — DEXTROSE 5 % IV SOLN: 5.0000 mg/h | INTRAVENOUS | Status: DC

## 2015-05-18 MED ORDER — LIDOCAINE-EPINEPHRINE (PF) 2 %-1:200000 IJ SOLN
10.0000 mL | Freq: Once | INTRAMUSCULAR | Status: AC
  Administered 2015-05-18: 10 mL
  Filled 2015-05-18: qty 10

## 2015-05-18 NOTE — ED Notes (Signed)
Brought in via EMS.  Pt woke up on the floor in a small pool of blood after having 5 shots and some beer earlier tonight.  Lac noted to left side of nose going up into left eyebrow.  Denies any neck or back pain.  Neck immobilizer placed via ems. Zofran 4mg  IV given enroute.  EKG from ems shows Afib.  Denies any history of afib.  [id number].

## 2015-05-18 NOTE — Discharge Instructions (Signed)
Laceration Care, Adult °A laceration is a cut that goes through all of the layers of the skin and into the tissue that is right under the skin. Some lacerations heal on their own. Others need to be closed with stitches (sutures), staples, skin adhesive strips, or skin glue. Proper laceration care minimizes the risk of infection and helps the laceration to heal better. °HOW TO CARE FOR YOUR LACERATION °If sutures or staples were used: °· Keep the wound clean and dry. °· If you were given a bandage (dressing), you should change it at least one time per day or as told by your health care provider. You should also change it if it becomes wet or dirty. °· Keep the wound completely dry for the first 24 hours or as told by your health care provider. After that time, you may shower or bathe. However, make sure that the wound is not soaked in water until after the sutures or staples have been removed. °· Clean the wound one time each day or as told by your health care provider: °· Wash the wound with soap and water. °· Rinse the wound with water to remove all soap. °· Pat the wound dry with a clean towel. Do not rub the wound. °· After cleaning the wound, apply a thin layer of antibiotic ointment as told by your health care provider. This will help to prevent infection and keep the dressing from sticking to the wound. °· Have the sutures or staples removed as told by your health care provider. °If skin adhesive strips were used: °· Keep the wound clean and dry. °· If you were given a bandage (dressing), you should change it at least one time per day or as told by your health care provider. You should also change it if it becomes dirty or wet. °· Do not get the skin adhesive strips wet. You may shower or bathe, but be careful to keep the wound dry. °· If the wound gets wet, pat it dry with a clean towel. Do not rub the wound. °· Skin adhesive strips fall off on their own. You may trim the strips as the wound heals. Do not  remove skin adhesive strips that are still stuck to the wound. They will fall off in time. °If skin glue was used: °· Try to keep the wound dry, but you may briefly wet it in the shower or bath. Do not soak the wound in water, such as by swimming. °· After you have showered or bathed, gently pat the wound dry with a clean towel. Do not rub the wound. °· Do not do any activities that will make you sweat heavily until the skin glue has fallen off on its own. °· Do not apply liquid, cream, or ointment medicine to the wound while the skin glue is in place. Using those may loosen the film before the wound has healed. °· If you were given a bandage (dressing), you should change it at least one time per day or as told by your health care provider. You should also change it if it becomes dirty or wet. °· If a dressing is placed over the wound, be careful not to apply tape directly over the skin glue. Doing that may cause the glue to be pulled off before the wound has healed. °· Do not pick at the glue. The skin glue usually remains in place for 5-10 days, then it falls off of the skin. °General Instructions °· Take over-the-counter and prescription   medicines only as told by your health care provider.  If you were prescribed an antibiotic medicine or ointment, take or apply it as told by your doctor. Do not stop using it even if your condition improves.  To help prevent scarring, make sure to cover your wound with sunscreen whenever you are outside after stitches are removed, after adhesive strips are removed, or when glue remains in place and the wound is healed. Make sure to wear a sunscreen of at least 30 SPF.  Do not scratch or pick at the wound.  Keep all follow-up visits as told by your health care provider. This is important.  Check your wound every day for signs of infection. Watch for:  Redness, swelling, or pain.  Fluid, blood, or pus.  Raise (elevate) the injured area above the level of your heart  while you are sitting or lying down, if possible. SEEK MEDICAL CARE IF:  You received a tetanus shot and you have swelling, severe pain, redness, or bleeding at the injection site.  You have a fever.  A wound that was closed breaks open.  You notice a bad smell coming from your wound or your dressing.  You notice something coming out of the wound, such as wood or glass.  Your pain is not controlled with medicine.  You have increased redness, swelling, or pain at the site of your wound.  You have fluid, blood, or pus coming from your wound.  You notice a change in the color of your skin near your wound.  You need to change the dressing frequently due to fluid, blood, or pus draining from the wound.  You develop a new rash.  You develop numbness around the wound. SEEK IMMEDIATE MEDICAL CARE IF:  You develop severe swelling around the wound.  Your pain suddenly increases and is severe.  You develop painful lumps near the wound or on skin that is anywhere on your body.  You have a red streak going away from your wound.  The wound is on your hand or foot and you cannot properly move a finger or toe.  The wound is on your hand or foot and you notice that your fingers or toes look pale or bluish.   This information is not intended to replace advice given to you by your health care provider. Make sure you discuss any questions you have with your health care provider.   Document Released: 04/04/2005 Document Revised: 08/19/2014 Document Reviewed: 03/31/2014 Elsevier Interactive Patient Education 2016 [employer]  Atrial Fibrillation Atrial fibrillation is a type of irregular or rapid heartbeat (arrhythmia). In atrial fibrillation, the heart quivers continuously in a chaotic pattern. This occurs when parts of the heart receive disorganized signals that make the heart unable to pump blood normally. This can increase the risk for stroke, heart failure, and other heart-related  conditions. There are different types of atrial fibrillation, including:  Paroxysmal atrial fibrillation. This type starts suddenly, and it usually stops on its own shortly after it starts.  Persistent atrial fibrillation. This type often lasts longer than a week. It may stop on its own or with treatment.  Long-lasting persistent atrial fibrillation. This type lasts longer than 12 months.  Permanent atrial fibrillation. This type does not go away. Talk with your health care provider to learn about the type of atrial fibrillation that you have. CAUSES This condition is caused by some heart-related conditions or procedures, including:  A heart attack.  Coronary artery disease.  Heart failure.  Heart valve conditions.  High blood pressure.  Inflammation of the sac that surrounds the heart (pericarditis).  Heart surgery.  Certain heart rhythm disorders, such as Wolf-Parkinson-White syndrome. Other causes include:  Pneumonia.  Obstructive sleep apnea.  Blockage of an artery in the lungs (pulmonary embolism, or PE).  Lung cancer.  Chronic lung disease.  Thyroid problems, especially if the thyroid is overactive (hyperthyroidism).  Caffeine.  Excessive alcohol use or illegal drug use.  Use of some medicines, including certain decongestants and diet pills. Sometimes, the cause cannot be found. RISK FACTORS This condition is more likely to develop in:  People who are older in age.  People who smoke.  People who have diabetes mellitus.  People who are overweight (obese).  Athletes who exercise vigorously. SYMPTOMS Symptoms of this condition include:  A feeling that your heart is beating rapidly or irregularly.  A feeling of discomfort or pain in your chest.  Shortness of breath.  Sudden light-headedness or weakness.  Getting tired easily during exercise. In some cases, there are no symptoms. DIAGNOSIS Your health care provider may be able to detect  atrial fibrillation when taking your pulse. If detected, this condition may be diagnosed with:  An electrocardiogram (ECG).  A Holter monitor test that records your heartbeat patterns over a 24-hour period.  Transthoracic echocardiogram (TTE) to evaluate how blood flows through your heart.  Transesophageal echocardiogram (TEE) to view more detailed images of your heart.  A stress test.  Imaging tests, such as a CT scan or chest X-ray.  Blood tests. TREATMENT The main goals of treatment are to prevent blood clots from forming and to keep your heart beating at a normal rate and rhythm. The type of treatment that you receive depends on many factors, such as your underlying medical conditions and how you feel when you are experiencing atrial fibrillation. This condition may be treated with:  Medicine to slow down the heart rate, bring the heart's rhythm back to normal, or prevent clots from forming.  Electrical cardioversion. This is a procedure that resets your heart's rhythm by delivering a controlled, low-energy shock to the heart through your skin.  Different types of ablation, such as catheter ablation, catheter ablation with pacemaker, or surgical ablation. These procedures destroy the heart tissues that send abnormal signals. When the pacemaker is used, it is placed under your skin to help your heart beat in a regular rhythm. HOME CARE INSTRUCTIONS  Take over-the counter and prescription medicines only as told by your health care provider.  If your health care provider prescribed a blood-thinning medicine (anticoagulant), take it exactly as told. Taking too much blood-thinning medicine can cause bleeding. If you do not take enough blood-thinning medicine, you will not have the protection that you need against stroke and other problems.  Do not use tobacco products, including cigarettes, chewing tobacco, and e-cigarettes. If you need help quitting, ask your health care provider.  If  you have obstructive sleep apnea, manage your condition as told by your health care provider.  Do not drink alcohol.  Do not drink beverages that contain caffeine, such as coffee, soda, and tea.  Maintain a healthy weight. Do not use diet pills unless your health care provider approves. Diet pills may make heart problems worse.  Follow diet instructions as told by your health care provider.  Exercise regularly as told by your health care provider.  Keep all follow-up visits as told by your health care provider. This is important. PREVENTION  Avoid drinking  beverages that contain caffeine or alcohol.  Avoid certain medicines, especially medicines that are used for breathing problems.  Avoid certain herbs and herbal medicines, such as those that contain ephedra or ginseng.  Do not use illegal drugs, such as cocaine and amphetamines.  Do not smoke.  Manage your high blood pressure. SEEK MEDICAL CARE IF:  You notice a change in the rate, rhythm, or strength of your heartbeat.  You are taking an anticoagulant and you notice increased bruising.  You tire more easily when you exercise or exert yourself. SEEK IMMEDIATE MEDICAL CARE IF:  You have chest pain, abdominal pain, sweating, or weakness.  You feel nauseous.  You notice blood in your vomit, bowel movement, or urine.  You have shortness of breath.  You suddenly have swollen feet and ankles.  You feel dizzy.  You have sudden weakness or numbness of the face, arm, or leg, especially on one side of the body.  You have trouble speaking, trouble understanding, or both (aphasia).  Your face or your eyelid droops on one side. These symptoms may represent a serious problem that is an emergency. Do not wait to see if the symptoms will go away. Get medical help right away. Call your local emergency services (911 in the [place] Do not drive yourself to the hospital.   This information is not intended to replace advice given to  you by your health care provider. Make sure you discuss any questions you have with your health care provider.   Document Released: 04/04/2005 Document Revised: 12/24/2014 Document Reviewed: 07/30/2014 Elsevier Interactive Patient Education [employer].

## 2015-05-18 NOTE — ED Notes (Signed)
Dr. Betsey Holiday at bedside suturing patient at this time.

## 2015-05-18 NOTE — ED Provider Notes (Signed)
CSN: [id number]     Arrival date & time 05/18/15  [id number] History  By signing my name below, I, Terrance Branch, attest that this documentation has been prepared under the direction and in the presence of Orpah Greek, MD. Electronically Signed: Randa Evens, ED Scribe. 05/18/2015. 3:20 AM.      Chief Complaint  Patient presents with  . Fall    The history is provided by the patient. No language interpreter was used.   HPI Comments: Nicholas Crosby is a 50 y.o. male who presents to the Emergency Department complaining of fall onset tonight PTA. Pt reports ETOH use tonight. He states that he is not sure how he fell due to being intoxicated. He presents with a left forehead lacertaion. Pt states that the bleeding is controlled with pressure. He does report LOC. He reports some nausea that improved after receiving Zofran. Pt denies neck pain or back pain.     Past Medical History  Diagnosis Date  . Hypertension   . Hyperlipidemia   . Depression    Past Surgical History  Procedure Laterality Date  . No past surgeries    . Vasectomy  04/06/2012    Procedure: VASECTOMY;  Surgeon: Alexis Frock, MD;  Location: [facility];  Service: Urology;  Laterality: Bilateral;  . Cosmetic surgery      liposuction  . Cyst removal neck     Family History  Problem Relation Age of Onset  . Hypertension Mother   . Hyperlipidemia Mother   . Diabetes Mother   . Cancer Mother     breast  . Heart disease Father   . Hyperlipidemia Father   . Hypertension Father    Social History  Substance Use Topics  . Smoking status: Never Smoker   . Smokeless tobacco: None  . Alcohol Use: 1.2 oz/week    2 Cans of beer per week     Comment: social    Review of Systems  Musculoskeletal: Negative for back pain and neck pain.  Skin: Positive for wound.  Neurological: Positive for syncope.  All other systems reviewed and are negative.    Allergies  Zocor and Crestor  Home  Medications   Prior to Admission medications   Medication Sig Start Date End Date Taking? Authorizing Provider  aspirin 81 MG tablet Take 81 mg by mouth daily.    Historical Provider, MD  atenolol (TENORMIN) 100 MG tablet Take 1 tablet (100 mg total) by mouth daily. Takes 1 whole tab daily 01/05/15   Vicie Mutters, PA-C  Cholecalciferol (VITAMIN D3) 5000 UNITS TABS Take 1 tablet (5,000 Units total) by mouth daily. 09/30/14   Unk Pinto, MD  enalapril (VASOTEC) 10 MG tablet Take 10 mg by mouth daily.    Historical Provider, MD  hydrochlorothiazide (HYDRODIURIL) 25 MG tablet Take 1 tablet (25 mg total) by mouth daily. 09/30/14   Unk Pinto, MD  minoxidil (LONITEN) 10 MG tablet Take 1/2 to 1 tab daily as directed for BP 03/02/15   Unk Pinto, MD  pravastatin (PRAVACHOL) 20 MG tablet Take 1 tablet (20 mg total) by mouth daily. 03/02/15   Unk Pinto, MD  PROCTOSOL [facility] 2.5 % rectal cream  11/05/13   Historical Provider, MD  testosterone cypionate (DEPOTESTOSTERONE CYPIONATE) 200 MG/ML injection 2cc q 2 weeks and needs 3 cc syringes And 1" 21 G needles 12/18/14 04/19/15  Unk Pinto, MD  valACYclovir (VALTREX) 1000 MG tablet 1 tablet 3 x daily for cld sores / fever b;listers  11/04/14   Unk Pinto, MD  verapamil (CALAN-SR) 240 MG CR tablet Take 1 tablet daily with food for BP 05/14/15 11/11/15  Unk Pinto, MD   BP 148/99 mmHg  Pulse 85  Temp(Src) 98 F (36.7 C) (Oral)  Resp 12  Ht 5\' 11"  (1.803 m)  Wt 275 lb (124.739 kg)  BMI 38.37 kg/m2  SpO2 97%   Physical Exam  Constitutional: He is oriented to person, place, and time. He appears well-developed and well-nourished. No distress.  HENT:  Head: Normocephalic.  Right Ear: Hearing normal.  Left Ear: Hearing normal.  Nose: Nose normal.  Mouth/Throat: Oropharynx is clear and moist and mucous membranes are normal.  Eyes: Conjunctivae and EOM are normal. Pupils are equal, round, and reactive to light.  Neck: Normal range of  motion. Neck supple.  Cardiovascular: S1 normal and S2 normal.  An irregularly irregular rhythm present. Tachycardia present.  Exam reveals no gallop and no friction rub.   No murmur heard. Pulmonary/Chest: Effort normal and breath sounds normal. No respiratory distress. He exhibits no tenderness.  Abdominal: Soft. Normal appearance and bowel sounds are normal. There is no hepatosplenomegaly. There is no tenderness. There is no rebound, no guarding, no tenderness at McBurney's point and negative Murphy's sign. No hernia.  Musculoskeletal: Normal range of motion.  Neurological: He is alert and oriented to person, place, and time. He has normal strength. No cranial nerve deficit or sensory deficit. Coordination normal. GCS eye subscore is 4. GCS verbal subscore is 5. GCS motor subscore is 6.  Skin: Skin is warm, dry and intact. No rash noted. No cyanosis.  Psychiatric: He has a normal mood and affect. His speech is normal and behavior is normal. Thought content normal.  Nursing note and vitals reviewed.   ED Course  Procedures (including critical care time)  LACERATION REPAIR Performed by: Orpah Greek. Authorized by: Orpah Greek Consent: Verbal consent obtained. Risks and benefits: risks, benefits and alternatives were discussed Consent given by: patient Patient identity confirmed: provided demographic data Prepped and Draped in normal sterile fashion Wound explored  Wound was complex secondary to irregular nature with multiple stellate areas  Laceration Location: face  Laceration Length: 4cm  No Foreign Bodies seen or palpated  Anesthesia: local infiltration  Local anesthetic: lidocaine 2% with epinephrine  Anesthetic total: 4 ml  Irrigation method: syringe Amount of cleaning: standard  Skin closure: suture  Number of sutures: 5-0 Vicryl, 2 buried horizontal mattress sutures; 5-0 Prolene 8 super interrupted skin sutures   Patient tolerance: Patient  tolerated the procedure well with no immediate complications.   DIAGNOSTIC STUDIES: Oxygen Saturation is 97% on RA, normal by my interpretation.    COORDINATION OF CARE: 3:10 AM-Discussed treatment plan with pt at bedside and pt agreed to plan.     Labs Review Labs Reviewed - No data to display  Imaging Review Dg Chest 2 View  05/18/2015  CLINICAL DATA:  Fall. Initial encounter. EXAM: CHEST  2 VIEW COMPARISON:  01/13/2014 FINDINGS: Prominent left ventricular size. Negative aortic and hilar contours. No acute infiltrate or edema. No effusion or pneumothorax. No acute osseous findings. IMPRESSION: No acute finding. Electronically Signed   By: Monte Fantasia M.D.   On: 05/18/2015 03:53   Ct Head Wo Contrast  05/18/2015  CLINICAL DATA:  Fall with left forehead laceration. Loss of consciousness. Initial encounter. EXAM: CT HEAD WITHOUT CONTRAST CT CERVICAL SPINE WITHOUT CONTRAST TECHNIQUE: Multidetector CT imaging of the head and cervical spine was performed  following the standard protocol without intravenous contrast. Multiplanar CT image reconstructions of the cervical spine were also generated. COMPARISON:  None. FINDINGS: CT HEAD FINDINGS Skull and Sinuses:Left forehead laceration without underlying fracture or opaque foreign body. Visualized orbits: Negative. Brain: Negative. No evidence of acute infarction, hemorrhage, hydrocephalus, or mass lesion/mass effect. No atrophy or extra-axial collection. CT CERVICAL SPINE FINDINGS Negative for acute fracture or subluxation. No prevertebral edema. No gross cervical canal hematoma. No significant osseous canal or foraminal stenosis. IMPRESSION: 1. No evidence of intracranial or cervical spine injury. 2. Left forehead laceration without fracture. Electronically Signed   By: Monte Fantasia M.D.   On: 05/18/2015 03:58   Ct Cervical Spine Wo Contrast  05/18/2015  CLINICAL DATA:  Fall with left forehead laceration. Loss of consciousness. Initial  encounter. EXAM: CT HEAD WITHOUT CONTRAST CT CERVICAL SPINE WITHOUT CONTRAST TECHNIQUE: Multidetector CT imaging of the head and cervical spine was performed following the standard protocol without intravenous contrast. Multiplanar CT image reconstructions of the cervical spine were also generated. COMPARISON:  None. FINDINGS: CT HEAD FINDINGS Skull and Sinuses:Left forehead laceration without underlying fracture or opaque foreign body. Visualized orbits: Negative. Brain: Negative. No evidence of acute infarction, hemorrhage, hydrocephalus, or mass lesion/mass effect. No atrophy or extra-axial collection. CT CERVICAL SPINE FINDINGS Negative for acute fracture or subluxation. No prevertebral edema. No gross cervical canal hematoma. No significant osseous canal or foraminal stenosis. IMPRESSION: 1. No evidence of intracranial or cervical spine injury. 2. Left forehead laceration without fracture. Electronically Signed   By: Monte Fantasia M.D.   On: 05/18/2015 03:58      EKG Interpretation   Date/Time:  Monday May 18 2015 02:58:44 EST Ventricular Rate:  106 PR Interval:    QRS Duration: 115 QT Interval:  359 QTC Calculation: 477 R Axis:   45 Text Interpretation:  Age not entered, assumed to be  50 years old for  purpose of ECG interpretation Atrial fibrillation Nonspecific  intraventricular conduction delay Borderline ST depression, diffuse leads  Minimal ST elevation, lateral leads Confirmed by POLLINA  MD, CHRISTOPHER  [phone]) on 05/18/2015 3:09:33 AM      EKG Interpretation  Date/Time:  Monday May 18 2015 04:18:35 EST Ventricular Rate:  81 PR Interval:  176 QRS Duration: 121 QT Interval:  382 QTC Calculation: 443 R Axis:   47 Text Interpretation:  Sinus rhythm Nonspecific intraventricular conduction delay Baseline wander in lead(s) V1 Confirmed by POLLINA  MD, CHRISTOPHER [id number]) on 05/18/2015 5:06:37 AM        MDM   Final diagnoses:  None  laceration Alcohol  intoxication afib w/ RVR  Patient presented to the ER for evaluation of head injury from a fall. Patient admits to drinking multiple shots of liquor tonight. He reports that he thinks he went to bed, but then he woke up on the floor in his room. He had a laceration on his forehead and was bleeding. He is not sure what occurred. He is complaining of headache. CT head and cervical spine unremarkable for injury other than laceration.  Patient was found to be in atrial fibrillation with rapid ventricular response by EMS. He did, however, converted to sinus rhythm prior to intervention with diltiazem. He has been monitored and has been in sinus rhythm throughout.  Wound was repaired with sutures. sutures to be removed in 7 days.  I personally performed the services described in this documentation, which was scribed in my presence. The recorded information has been reviewed and is accurate.  Orpah Greek, MD 05/18/15 [phone]

## 2015-05-19 ENCOUNTER — Ambulatory Visit (INDEPENDENT_AMBULATORY_CARE_PROVIDER_SITE_OTHER): Payer: Managed Care, Other (non HMO) | Admitting: Internal Medicine

## 2015-05-19 ENCOUNTER — Encounter: Payer: Self-pay | Admitting: Internal Medicine

## 2015-05-19 VITALS — BP 144/86 | HR 60 | Temp 97.7°F | Resp 16 | Ht 71.0 in | Wt 280.4 lb

## 2015-05-19 DIAGNOSIS — E349 Endocrine disorder, unspecified: Secondary | ICD-10-CM

## 2015-05-19 DIAGNOSIS — I10 Essential (primary) hypertension: Secondary | ICD-10-CM | POA: Diagnosis not present

## 2015-05-19 DIAGNOSIS — Z79899 Other long term (current) drug therapy: Secondary | ICD-10-CM | POA: Diagnosis not present

## 2015-05-19 DIAGNOSIS — E291 Testicular hypofunction: Secondary | ICD-10-CM | POA: Diagnosis not present

## 2015-05-19 NOTE — Progress Notes (Signed)
  Subjective:    Patient ID: Nicholas Crosby, male    DOB: May 22, 1965, 50 y.o.   MRN: [id number]  HPI  This very nice [age] yo [name] with HTN, HLD, PreDM & Low T was seen in ER 2 days ago for a fall with laceration to his forehead and had a neg Head &  C-Spine CT scan and CXR. Apparently he had transient Afib in transient by EMS to ER which converted spontaneously w/o treatment.   Medication Sig  . aspirin 81 MG tablet Take 81 mg by mouth daily.  Marland Kitchen atenolol  100 MG tablet  Takes 1 whole tab daily  . VITAMIN D 5000 UNITS TABS Take 1 tab daily.  . enalapril  10 MG tablet Take  daily.  . hydrochlorothiazide  25 MG tablet Take 1 tab daily.  . pravastatin  20 MG tablet Take 1 tab daily.  . valACYclovir 1000 MG tablet 1 tablet 3 x daily for cld sores / fever b;listers  . verapamil -S) 240 MG CR tablet Take 1 tab daily with food for BP  . minoxidil (LONITEN) 10 MG tablet not taking: Reported on 05/18/2015)  . testosterone cypionate  200 MG/ML inj 2cc q 2 weeks and needs 3 cc syringes And 1" 21 G needles   Allergies  Allergen Reactions  . Zocor [Simvastatin] Other (See Comments)    cpk elev  . Crestor [Rosuvastatin] Palpitations   Past Medical History  Diagnosis Date  . Hypertension   . Hyperlipidemia   . Depression    Review of Systems  10 point systems review negative except as above.    Objective:   Physical Exam  BP 144/86 mmHg  Pulse 60  Temp(Src) 97.7 F (36.5 C)  Resp 16  Ht 5\' 11"  (1.803 m)  Wt 280 lb 6.4 oz (127.189 kg)  BMI 39.13 kg/m2  HEENT -    2" S-shaped laceration over the medial left brow. Eac's patent. TM's Nl. EOM's full. PERRLA. NasoOroPharynx clear. Neck - supple. Nl Thyroid. Carotids 2+ & No bruits, nodes, JVD Chest - Clear equal BS w/o Rales, rhonchi, wheezes. Cor - Nl HS. RRR w/o sig MGR. PP 1(+). No edema. Abd - No palpable organomegaly, masses or tenderness. BS nl. MS- FROM w/o deformities. Muscle power, tone and bulk Nl. Gait Nl. Neuro - No obvious Cr N  abnormalities. Sensory, motor and Cerebellar functions appear Nl w/o focal abnormalities. Psyche - Mental status normal & appropriate.  No delusions, ideations or obvious mood abnormalities.    Assessment & Plan:   1. Essential hypertension   2. Testosterone deficiency  - Testosterone  3. Medication management  - BASIC METABOLIC PANEL WITH GFR - Magnesium  4. Laceration Forehead  - ROV 3 days for suture removal

## 2015-05-20 LAB — BASIC METABOLIC PANEL WITH GFR
BUN: 12 mg/dL (ref 7–25)
CO2: 29 mmol/L (ref 20–31)
Calcium: 9.3 mg/dL (ref 8.6–10.3)
Chloride: 99 mmol/L (ref 98–110)
Creat: 0.95 mg/dL (ref 0.60–1.35)
GFR, Est African American: >89 mL/min (ref >=60)
GFR, Est Non African American: >89 mL/min (ref >=60)
Glucose, Bld: 76 mg/dL (ref 65–99)
Potassium: 3.7 mmol/L (ref 3.5–5.3)
Sodium: 140 mmol/L (ref 135–146)

## 2015-05-20 LAB — MAGNESIUM: Magnesium: 1.7 mg/dL (ref 1.5–2.5)

## 2015-05-20 LAB — TESTOSTERONE: Testosterone: 1242 ng/dL — ABNORMAL HIGH (ref 250–827)

## 2015-05-22 ENCOUNTER — Ambulatory Visit: Payer: Managed Care, Other (non HMO) | Admitting: Internal Medicine

## 2015-05-22 ENCOUNTER — Encounter: Payer: Self-pay | Admitting: Internal Medicine

## 2015-05-22 VITALS — BP 152/100 | HR 64 | Temp 97.7°F | Resp 16 | Ht 71.0 in | Wt 279.2 lb

## 2015-05-22 DIAGNOSIS — S0181XD Laceration without foreign body of other part of head, subsequent encounter: Secondary | ICD-10-CM

## 2015-05-22 NOTE — Progress Notes (Signed)
Patient ID: Nicholas Crosby, male   DOB: 03-21-1966, 50 y.o.   MRN: [id number]   BP 152/100 mmHg  Pulse 64  Temp(Src) 97.7 F (36.5 C)  Resp 16  Ht 5\' 11"  (1.803 m)  Wt 279 lb 3.7 oz (126.658 kg)  BMI 38.96 kg/m2 (has not taken BP meds yet this am.     % day old 2 " vertical laceration repair - nasal bridge to medial L eyebrow to forehead - appears clean with adequate healing ridge w/o signs of infection and # 8 interrupted proline sutures removed. 2" x 3" tegoderm applied for 5 days.

## 2015-06-01 ENCOUNTER — Emergency Department (HOSPITAL_BASED_OUTPATIENT_CLINIC_OR_DEPARTMENT_OTHER)
Admission: EM | Admit: 2015-06-01 | Discharge: 2015-06-02 | Disposition: A | Discharge status: Home / Self Care | Payer: Managed Care, Other (non HMO) | Attending: Emergency Medicine | Admitting: Emergency Medicine

## 2015-06-01 ENCOUNTER — Encounter (HOSPITAL_BASED_OUTPATIENT_CLINIC_OR_DEPARTMENT_OTHER): Payer: Self-pay | Admitting: *Deleted

## 2015-06-01 DIAGNOSIS — Z7982 Long term (current) use of aspirin: Secondary | ICD-10-CM | POA: Insufficient documentation

## 2015-06-01 DIAGNOSIS — R197 Diarrhea, unspecified: Secondary | ICD-10-CM

## 2015-06-01 DIAGNOSIS — Z79899 Other long term (current) drug therapy: Secondary | ICD-10-CM | POA: Diagnosis not present

## 2015-06-01 DIAGNOSIS — R110 Nausea: Secondary | ICD-10-CM | POA: Insufficient documentation

## 2015-06-01 DIAGNOSIS — I10 Essential (primary) hypertension: Secondary | ICD-10-CM | POA: Insufficient documentation

## 2015-06-01 DIAGNOSIS — E785 Hyperlipidemia, unspecified: Secondary | ICD-10-CM | POA: Diagnosis not present

## 2015-06-01 DIAGNOSIS — F329 Major depressive disorder, single episode, unspecified: Secondary | ICD-10-CM | POA: Diagnosis not present

## 2015-06-01 DIAGNOSIS — R509 Fever, unspecified: Secondary | ICD-10-CM | POA: Diagnosis present

## 2015-06-01 LAB — CBC WITH DIFFERENTIAL/PLATELET
Basophils Absolute: 0 10*3/uL (ref 0.0–0.1)
Basophils Relative: 0 %
Eosinophils Absolute: 0 10*3/uL (ref 0.0–0.7)
Eosinophils Relative: 0 %
HCT: 45.7 % (ref 39.0–52.0)
Hemoglobin: 15.5 g/dL (ref 13.0–17.0)
Lymphocytes Relative: 4 %
Lymphs Abs: 0.4 10*3/uL — ABNORMAL LOW (ref 0.7–4.0)
MCH: 27.3 pg (ref 26.0–34.0)
MCHC: 33.9 g/dL (ref 30.0–36.0)
MCV: 80.5 fL (ref 78.0–100.0)
Monocytes Absolute: 0.8 10*3/uL (ref 0.1–1.0)
Monocytes Relative: 7 %
Neutro Abs: 10.3 10*3/uL — ABNORMAL HIGH (ref 1.7–7.7)
Neutrophils Relative %: 89 %
Platelets: 251 10*3/uL (ref 150–400)
RBC: 5.68 MIL/uL (ref 4.22–5.81)
RDW: 14.6 % (ref 11.5–15.5)
WBC: 11.6 10*3/uL — ABNORMAL HIGH (ref 4.0–10.5)

## 2015-06-01 LAB — URINALYSIS, ROUTINE W REFLEX MICROSCOPIC
Glucose, UA: NEGATIVE mg/dL
Hgb urine dipstick: NEGATIVE
Ketones, ur: 15 mg/dL — AB
Leukocytes, UA: NEGATIVE
Nitrite: NEGATIVE
Protein, ur: NEGATIVE mg/dL
Specific Gravity, Urine: 1.031 — ABNORMAL HIGH (ref 1.005–1.030)
pH: 5 (ref 5.0–8.0)

## 2015-06-01 MED ORDER — SODIUM CHLORIDE 0.9 % IV BOLUS (SEPSIS)
1000.0000 mL | Freq: Once | INTRAVENOUS | Status: AC
  Administered 2015-06-01: 1000 mL via INTRAVENOUS

## 2015-06-01 NOTE — ED Provider Notes (Signed)
CSN: [id number]     Arrival date & time 06/01/15  2147 History   First MD Initiated Contact with Patient 06/01/15 2254     Chief Complaint  Patient presents with  . Fever    HPI   Nicholas Crosby is a 50 y.o. male with a PMH of HTN, HLD, depression who presents to the ED with diarrhea, which he states started this morning and has been constant since its onset. He notes his stool has been watery. He denies exacerbating or alleviating factors. He states he noticed he had a fever to 102 this afternoon. He denies cough, congestion, chest pain, shortness of breath, abdominal pain. He reports mild nausea, but denies vomiting. He denies dysuria, urgency, frequency.    Past Medical History  Diagnosis Date  . Hypertension   . Hyperlipidemia   . Depression    Past Surgical History  Procedure Laterality Date  . No past surgeries    . Vasectomy  04/06/2012    Procedure: VASECTOMY;  Surgeon: Alexis Frock, MD;  Location: [facility];  Service: Urology;  Laterality: Bilateral;  . Cosmetic surgery      liposuction  . Cyst removal neck     Family History  Problem Relation Age of Onset  . Hypertension Mother   . Hyperlipidemia Mother   . Diabetes Mother   . Cancer Mother     breast  . Heart disease Father   . Hyperlipidemia Father   . Hypertension Father    Social History  Substance Use Topics  . Smoking status: Never Smoker   . Smokeless tobacco: None  . Alcohol Use: 1.2 oz/week    2 Cans of beer per week     Comment: social      Review of Systems  Constitutional: Positive for fever. Negative for chills.  Gastrointestinal: Positive for nausea and diarrhea. Negative for vomiting, abdominal pain, constipation and blood in stool.  Genitourinary: Negative for dysuria, urgency and frequency.  All other systems reviewed and are negative.     Allergies  Zocor and Crestor  Home Medications   Prior to Admission medications   Medication Sig Start Date End Date  Taking? Authorizing Provider  aspirin 81 MG tablet Take 81 mg by mouth daily.    Historical Provider, MD  atenolol (TENORMIN) 100 MG tablet Take 1 tablet (100 mg total) by mouth daily. Takes 1 whole tab daily 01/05/15   Vicie Mutters, PA-C  Cholecalciferol (VITAMIN D3) 5000 UNITS TABS Take 1 tablet (5,000 Units total) by mouth daily. 09/30/14   Unk Pinto, MD  doxycycline (VIBRAMYCIN) 100 MG capsule Take 1 capsule (100 mg total) by mouth 2 (two) times daily. 05/18/15   Orpah Greek, MD  enalapril (VASOTEC) 10 MG tablet Take 10 mg by mouth daily.    Historical Provider, MD  hydrochlorothiazide (HYDRODIURIL) 25 MG tablet Take 1 tablet (25 mg total) by mouth daily. 09/30/14   Unk Pinto, MD  minoxidil (LONITEN) 10 MG tablet Take 1/2 to 1 tab daily as directed for BP 03/02/15   Unk Pinto, MD  pravastatin (PRAVACHOL) 20 MG tablet Take 1 tablet (20 mg total) by mouth daily. 03/02/15   Unk Pinto, MD  testosterone cypionate (DEPOTESTOSTERONE CYPIONATE) 200 MG/ML injection 2cc q 2 weeks and needs 3 cc syringes And 1" 21 G needles 12/18/14 04/19/15  Unk Pinto, MD  valACYclovir (VALTREX) 1000 MG tablet 1 tablet 3 x daily for cld sores / fever b;listers 11/04/14   Unk Pinto, MD  verapamil (CALAN-SR) 240 MG CR tablet Take 1 tablet daily with food for BP 05/14/15 11/11/15  Unk Pinto, MD    BP 162/94 mmHg  Pulse 89  Temp(Src) 99.5 F (37.5 C) (Oral)  Resp 20  Ht 5\' 11"  (1.803 m)  Wt 126.554 kg  BMI 38.93 kg/m2  SpO2 97% Physical Exam  Constitutional: He is oriented to person, place, and time. He appears well-developed and well-nourished. No distress.  HENT:  Head: Normocephalic and atraumatic.  Right Ear: External ear normal.  Left Ear: External ear normal.  Nose: Nose normal.  Mouth/Throat: Uvula is midline, oropharynx is clear and moist and mucous membranes are normal.  Eyes: Conjunctivae, EOM and lids are normal. Pupils are equal, round, and reactive to  light. Right eye exhibits no discharge. Left eye exhibits no discharge. No scleral icterus.  Neck: Normal range of motion. Neck supple.  Cardiovascular: Normal rate, regular rhythm, normal heart sounds, intact distal pulses and normal pulses.   Pulmonary/Chest: Effort normal and breath sounds normal. No respiratory distress. He has no wheezes. He has no rales.  Abdominal: Soft. Normal appearance and bowel sounds are normal. He exhibits no distension and no mass. There is no tenderness. There is no rigidity, no rebound and no guarding.  Musculoskeletal: Normal range of motion. He exhibits no edema or tenderness.  Neurological: He is alert and oriented to person, place, and time.  Skin: Skin is warm, dry and intact. No rash noted. He is not diaphoretic. No erythema. No pallor.  Psychiatric: He has a normal mood and affect. His speech is normal and behavior is normal.  Nursing note and vitals reviewed.   ED Course  Procedures (including critical care time)  Labs Review Labs Reviewed  CBC WITH DIFFERENTIAL/PLATELET - Abnormal; Notable for the following:    WBC 11.6 (*)    Neutro Abs 10.3 (*)    Lymphs Abs 0.4 (*)    All other components within normal limits  COMPREHENSIVE METABOLIC PANEL - Abnormal; Notable for the following:    Glucose, Bld 124 (*)    Calcium 8.5 (*)    Alkaline Phosphatase 37 (*)    All other components within normal limits  URINALYSIS, ROUTINE W REFLEX MICROSCOPIC (NOT AT [facility]) - Abnormal; Notable for the following:    Color, Urine AMBER (*)    Specific Gravity, Urine 1.031 (*)    Bilirubin Urine SMALL (*)    Ketones, ur 15 (*)    All other components within normal limits  LIPASE, BLOOD    Imaging Review No results found.   I have personally reviewed and evaluated these lab results as part of my medical decision-making.   EKG Interpretation None      MDM   Final diagnoses:  Diarrhea, unspecified type    50 year old male presents with diarrhea.  Reports associated nausea. Also notes fever this afternoon prior to arrival. Denies cough, congestion, chest pain, shortness of breath, abdominal pain, vomiting, dysuria, urgency, frequency.  Patient's temp 100.2, HR 89, no hypotension. Heart RRR. Lungs clear bilaterally. Abdomen soft, non-tender, non-distended. No rebound, guarding, or masses.  Given fluids for symptoms.  CBC remarkable for leukocytosis of 11.6, no anemia. CMP unremarkable. Lipase within normal limits. UA negative for infection.  On reassessment of patient, he reports significant symptom improvement. Repeat temp 99.5. Patient is nontoxic and well-appearing, feel he is stable for discharge at this time. Patient to follow-up with PCP. Return precautions discussed. Patient verbalizes his understanding and is in agreement  with plan.  BP 162/94 mmHg  Pulse 89  Temp(Src) 99.5 F (37.5 C) (Oral)  Resp 20  Ht 5\' 11"  (1.803 m)  Wt 126.554 kg  BMI 38.93 kg/m2  SpO2 97%     Marella Chimes, PA-C 06/02/15 0041  Shanon Rosser, MD 06/02/15 0130

## 2015-06-01 NOTE — ED Notes (Signed)
Pt c/o diarrhea and nausea since this morning with fever of 101.  Pt has been medicating with tylenol and ibuprofen and is able to tolerate PO fluids.

## 2015-06-01 NOTE — ED Notes (Signed)
Fever diarrhea today.

## 2015-06-02 LAB — COMPREHENSIVE METABOLIC PANEL
ALT: 27 U/L (ref 17–63)
AST: 27 U/L (ref 15–41)
Albumin: 3.9 g/dL (ref 3.5–5.0)
Alkaline Phosphatase: 37 U/L — ABNORMAL LOW (ref 38–126)
Anion gap: 10 (ref 5–15)
BUN: 15 mg/dL (ref 6–20)
CO2: 25 mmol/L (ref 22–32)
Calcium: 8.5 mg/dL — ABNORMAL LOW (ref 8.9–10.3)
Chloride: 102 mmol/L (ref 101–111)
Creatinine, Ser: 1.01 mg/dL (ref 0.61–1.24)
GFR calc Af Amer: >60 mL/min (ref >60)
GFR calc non Af Amer: >60 mL/min (ref >60)
Glucose, Bld: 124 mg/dL — ABNORMAL HIGH (ref 65–99)
Potassium: 3.6 mmol/L (ref 3.5–5.1)
Sodium: 137 mmol/L (ref 135–145)
Total Bilirubin: 0.9 mg/dL (ref 0.3–1.2)
Total Protein: 7.2 g/dL (ref 6.5–8.1)

## 2015-06-02 LAB — LIPASE, BLOOD: Lipase: 29 U/L (ref 11–51)

## 2015-06-02 NOTE — ED Notes (Signed)
Pt verbalizes understanding of d/c instructions and denies any further needs at this time. 

## 2015-06-02 NOTE — Discharge Instructions (Signed)
1. Medications: usual home medications 2. Treatment: rest, drink plenty of fluids 3. Follow Up: please followup with your primary doctor for discussion of your diagnoses and further evaluation after today's visit; if you do not have a primary care doctor use the resource guide provided to find one; please return to the ER for high fever, severe pain, new or worsening symptoms   Diarrhea Diarrhea is watery poop (stool). It can make you feel weak, tired, thirsty, or give you a dry mouth (signs of dehydration). Watery poop is a sign of another problem, most often an infection. It often lasts 2-3 days. It can last longer if it is a sign of something serious. Take care of yourself as told by your doctor. HOME CARE   Drink 1 cup (8 ounces) of fluid each time you have watery poop.  Do not drink the following fluids:  Those that contain simple sugars (fructose, glucose, galactose, lactose, sucrose, maltose).  Sports drinks.  Fruit juices.  Whole milk products.  Sodas.  Drinks with caffeine (coffee, tea, soda) or alcohol.  Oral rehydration solution may be used if the doctor says it is okay. You may make your own solution. Follow this recipe:   - teaspoon table salt.   teaspoon baking soda.   teaspoon salt substitute containing potassium chloride.  1 tablespoons sugar.  1 liter (34 ounces) of water.  Avoid the following foods:  High fiber foods, such as raw fruits and vegetables.  Nuts, seeds, and whole grain breads and cereals.   Those that are sweetened with sugar alcohols (xylitol, sorbitol, mannitol).  Try eating the following foods:  Starchy foods, such as rice, toast, pasta, low-sugar cereal, oatmeal, baked potatoes, crackers, and bagels.  Bananas.  Applesauce.  Eat probiotic-rich foods, such as yogurt and milk products that are fermented.  Wash your hands well after each time you have watery poop.  Only take medicine as told by your doctor.  Take a warm bath  to help lessen burning or pain from having watery poop. GET HELP RIGHT AWAY IF:   You cannot drink fluids without throwing up (vomiting).  You keep throwing up.  You have blood in your poop, or your poop looks black and tarry.  You do not pee (urinate) in 6-8 hours, or there is only a small amount of very dark pee.  You have belly (abdominal) pain that gets worse or stays in the same spot (localizes).  You are weak, dizzy, confused, or light-headed.  You have a very bad headache.  Your watery poop gets worse or does not get better.  You have a fever or lasting symptoms for more than 2-3 days.  You have a fever and your symptoms suddenly get worse. MAKE SURE YOU:   Understand these instructions.  Will watch your condition.  Will get help right away if you are not doing well or get worse.   This information is not intended to replace advice given to you by your health care provider. Make sure you discuss any questions you have with your health care provider.   Document Released: 09/21/2007 Document Revised: 04/25/2014 Document Reviewed: 12/11/2011 Elsevier Interactive Patient Education [employer].

## 2015-06-05 ENCOUNTER — Ambulatory Visit (INDEPENDENT_AMBULATORY_CARE_PROVIDER_SITE_OTHER): Payer: Managed Care, Other (non HMO) | Admitting: Physician Assistant

## 2015-06-05 ENCOUNTER — Encounter: Payer: Self-pay | Admitting: Physician Assistant

## 2015-06-05 VITALS — BP 140/90 | HR 64 | Temp 97.9°F | Resp 16 | Ht 71.0 in | Wt 273.0 lb

## 2015-06-05 DIAGNOSIS — I10 Essential (primary) hypertension: Secondary | ICD-10-CM | POA: Diagnosis not present

## 2015-06-05 DIAGNOSIS — R002 Palpitations: Secondary | ICD-10-CM | POA: Diagnosis not present

## 2015-06-05 DIAGNOSIS — Z79899 Other long term (current) drug therapy: Secondary | ICD-10-CM | POA: Diagnosis not present

## 2015-06-05 LAB — CBC WITH DIFFERENTIAL/PLATELET
Basophils Absolute: 0.1 10*3/uL (ref 0.0–0.1)
Basophils Relative: 1 % (ref 0–1)
Eosinophils Absolute: 0.2 10*3/uL (ref 0.0–0.7)
Eosinophils Relative: 3 % (ref 0–5)
HCT: 44.9 % (ref 39.0–52.0)
Hemoglobin: 15.2 g/dL (ref 13.0–17.0)
Lymphocytes Relative: 24 % (ref 12–46)
Lymphs Abs: 1.8 10*3/uL (ref 0.7–4.0)
MCH: 27.4 pg (ref 26.0–34.0)
MCHC: 33.9 g/dL (ref 30.0–36.0)
MCV: 81 fL (ref 78.0–100.0)
MPV: 9.9 fL (ref 8.6–12.4)
Monocytes Absolute: 1.1 10*3/uL — ABNORMAL HIGH (ref 0.1–1.0)
Monocytes Relative: 14 % — ABNORMAL HIGH (ref 3–12)
Neutro Abs: 4.4 10*3/uL (ref 1.7–7.7)
Neutrophils Relative %: 58 % (ref 43–77)
Platelets: 287 10*3/uL (ref 150–400)
RBC: 5.54 MIL/uL (ref 4.22–5.81)
RDW: 15.2 % (ref 11.5–15.5)
WBC: 7.5 10*3/uL (ref 4.0–10.5)

## 2015-06-05 LAB — BASIC METABOLIC PANEL WITH GFR
BUN: 8 mg/dL (ref 7–25)
CO2: 32 mmol/L — ABNORMAL HIGH (ref 20–31)
Calcium: 9 mg/dL (ref 8.6–10.3)
Chloride: 100 mmol/L (ref 98–110)
Creat: 0.95 mg/dL (ref 0.60–1.35)
GFR, Est African American: >89 mL/min (ref >=60)
GFR, Est Non African American: >89 mL/min (ref >=60)
Glucose, Bld: 114 mg/dL — ABNORMAL HIGH (ref 65–99)
Potassium: 3.7 mmol/L (ref 3.5–5.3)
Sodium: 141 mmol/L (ref 135–146)

## 2015-06-05 LAB — TSH: TSH: 1.45 mIU/L (ref 0.40–4.50)

## 2015-06-05 LAB — MAGNESIUM: Magnesium: 2 mg/dL (ref 1.5–2.5)

## 2015-06-05 MED ORDER — ENALAPRIL MALEATE 20 MG PO TABS: 20.0000 mg | ORAL_TABLET | Freq: Every day | ORAL | Status: DC

## 2015-06-05 NOTE — Patient Instructions (Signed)

## 2015-06-05 NOTE — Progress Notes (Signed)
Assessment and Plan: HTN- continue atenolol, Verapamil 240, HTCZ 25mg , increase enalapril to 20mg , monitor BP, weight loss, dash diet Flutters- NSR now- check electrolytes/TSH- continue atenolol/verapamil, CHADSVASC is 1, continue ASA for now, declines referral to cardio for echo/holter at this time 4 weeks follow up  HPI 50 y.o.male with history of HTN, chol, preDM, transient afib in ER (assumed holiday heart from alcohol) presents for Bp check and flutter.  He had fever, diarrhea x Monday, went to urgent care for fluids, had elevated WBC, calcium 8.5, on 06/01/2015. States during the time he did not take his BP meds (monday and Tuesday) but started back on it. He states that he has been having some flutters, lasts seconds or less, no accompaniments, nonexertional, and his BP has been elevated at home/med center 160/90-150/100, today it is BP: 140/90 mmHg. He is on verapamil 240, atenolol 100, enalapril 10, HCTZ 25mg ,   This patients CHA2DS2-VASc Score and unadjusted Ischemic Stroke Rate (% per year) is equal to 0.6 % stroke rate/year from a score of 1  Above score calculated as 1 point each if present [CHF, HTN, DM, Vascular=MI/PAD/Aortic Plaque, Age if 65-74, or Male] Above score calculated as 2 points each if present [Age > 75, or Stroke/TIA/TE]  CHA2DS2VASc Score: Score of 0 is very low risk, score of 1 is intermediate risk rate of 0.6% at 1 year, score greater than 1 is high risk rate of 3% rate at 1 year.   CHA2DS2-VASc Risk Criteria Score  CHF 1 point  HTN 1 point  Age <65 0 points  Age 9-74 1 point  Age >/= 45 2 points  Diabetes 1 point  Stroke/TIA/Thromboembolism History 2 points  Vascular Disease History/PAD 1 point  Male or Male Male=0 points   Male=1 point   Your Score is __1___.    Past Medical History  Diagnosis Date  . Hypertension   . Hyperlipidemia   . Depression      Allergies  Allergen Reactions  . Zocor [Simvastatin] Other (See Comments)    cpk elev   . Crestor [Rosuvastatin] Palpitations      Current Outpatient Prescriptions on File Prior to Visit  Medication Sig Dispense Refill  . aspirin 81 MG tablet Take 81 mg by mouth daily.    Marland Kitchen atenolol (TENORMIN) 100 MG tablet Take 1 tablet (100 mg total) by mouth daily. Takes 1 whole tab daily 90 tablet 0  . Cholecalciferol (VITAMIN D3) 5000 UNITS TABS Take 1 tablet (5,000 Units total) by mouth daily. 90 tablet 3  . enalapril (VASOTEC) 10 MG tablet Take 10 mg by mouth daily.    . hydrochlorothiazide (HYDRODIURIL) 25 MG tablet Take 1 tablet (25 mg total) by mouth daily. 90 tablet 3  . pravastatin (PRAVACHOL) 20 MG tablet Take 1 tablet (20 mg total) by mouth daily. 90 tablet 1  . valACYclovir (VALTREX) 1000 MG tablet 1 tablet 3 x daily for cld sores / fever b;listers 90 tablet 1  . verapamil (CALAN-SR) 240 MG CR tablet Take 1 tablet daily with food for BP 90 tablet 1  . testosterone cypionate (DEPOTESTOSTERONE CYPIONATE) 200 MG/ML injection 2cc q 2 weeks and needs 3 cc syringes And 1" 21 G needles 20 mL 1   No current facility-administered medications on file prior to visit.    ROS: all negative except above.   Physical Exam: Filed Weights   06/05/15 1016  Weight: 273 lb (123.832 kg)   BP 140/90 mmHg  Pulse 64  Temp(Src) 97.9 F (  36.6 C) (Temporal)  Resp 16  Ht 5\' 11"  (1.803 m)  Wt 273 lb (123.832 kg)  BMI 38.09 kg/m2  SpO2 95% General Appearance: Well nourished, in no apparent distress. Eyes: PERRLA, EOMs, conjunctiva no swelling or erythema Sinuses: No Frontal/maxillary tenderness ENT/Mouth: Ext aud canals clear, TMs without erythema, bulging. No erythema, swelling, or exudate on post pharynx.  Tonsils not swollen or erythematous. Hearing normal.  Neck: Supple, thyroid normal.  Respiratory: Respiratory effort normal, BS equal bilaterally without rales, rhonchi, wheezing or stridor.  Cardio: RRR with with systolic 2/6 murmur RSB. Brisk peripheral pulses without edema.  Abdomen:  Soft, + BS.  Non tender, no guarding, rebound, hernias, masses. Lymphatics: Non tender without lymphadenopathy.  Musculoskeletal: Full ROM, 5/5 strength, normal gait.  Skin: Warm, dry without rashes, lesions, ecchymosis.  Neuro: Cranial nerves intact. Normal muscle tone, no cerebellar symptoms. Sensation intact.  Psych: Awake and oriented X 3, normal affect, Insight and Judgment appropriate.     Vicie Mutters, PA-C 10:27 AM [facility]

## 2015-06-07 ENCOUNTER — Other Ambulatory Visit: Payer: Self-pay | Admitting: Physician Assistant

## 2015-06-08 ENCOUNTER — Ambulatory Visit (INDEPENDENT_AMBULATORY_CARE_PROVIDER_SITE_OTHER): Payer: Managed Care, Other (non HMO) | Admitting: Internal Medicine

## 2015-06-08 ENCOUNTER — Other Ambulatory Visit: Payer: Self-pay | Admitting: Internal Medicine

## 2015-06-08 ENCOUNTER — Encounter: Payer: Self-pay | Admitting: Internal Medicine

## 2015-06-08 VITALS — BP 164/98 | HR 66 | Temp 98.0°F | Resp 18 | Ht 71.0 in | Wt 275.0 lb

## 2015-06-08 DIAGNOSIS — I10 Essential (primary) hypertension: Secondary | ICD-10-CM | POA: Diagnosis not present

## 2015-06-08 MED ORDER — ENALAPRIL MALEATE 20 MG PO TABS: 20.0000 mg | ORAL_TABLET | Freq: Every day | ORAL | Status: DC

## 2015-06-08 NOTE — Progress Notes (Signed)
  Subjective:    Patient ID: Nicholas Crosby, male    DOB: [birth year], 50 y.o.   MRN: [id number]  HPI  This very nice 50 yo [name] with hx/o labile HTN returns today with recently labile accelerated BP's in the range 149-160's /80's range and he denies any associated HT sx's as HA, dizziness, CP, palpitations, or edema.   Medication Sig  . aspirin 81 MG tablet Take 81 mg by mouth daily.  Marland Kitchen atenolol  100 MG tablet Take 1 tablet (100 mg total) by mouth daily. Takes 1 whole tab daily  . VITAMIN D 5000 UNITS  Take 1 tablet (5,000 Units total) by mouth daily.  . enalapril  20 MG tablet Take 1 tablet (20 mg total) by mouth daily.  . hctz 25 MG tablet Take 1 tablet (25 mg total) by mouth daily.  . pravastatin  20 MG tablet Take 1 tablet (20 mg total) by mouth daily.  Marland Kitchen testosterone cypionate 200 MG/ML inj 2cc q 2 weeks and needs 3 cc syringes And 1" 21 G needles  . valACYclovir 1000 MG  1 tablet 3 x daily for cld sores / fever b;listers  . verapamil-SR) 240 MG CR tablet Take 1 tablet daily with food for BP   Allergies  Allergen Reactions  . Zocor [Simvastatin] Other (See Comments)    cpk elev  . Crestor [Rosuvastatin] Palpitations   Past Medical History  Diagnosis Date  . Hypertension   . Hyperlipidemia   . Depression    Review of Systems  10 point systems review negative except as above.    Objective:   Physical Exam  BP 164/98 mmHg  Pulse 66  Temp(Src) 98 F (36.7 C) (Temporal)  Resp 18  Ht 5\' 11"  (1.803 m)  Wt 275 lb (124.739 kg)  BMI 38.37 kg/m2   HEENT - Eac's patent. TM's Nl. EOM's full. PERRLA. NasoOroPharynx clear. Neck - supple. Nl Thyroid. Carotids 2+ & No bruits, nodes, JVD Chest - Clear equal BS w/o Rales, rhonchi, wheezes. Cor - Nl HS. RRR w/o sig MGR. PP 1(+). No edema. Abd - No palpable organomegaly, masses or tenderness. BS nl. MS- FROM w/o deformities. Muscle power, tone and bulk Nl. Gait Nl. Neuro - No obvious Cr N abnormalities. Sensory, motor and Cerebellar  functions appear Nl w/o focal abnormalities. Psyche - Mental status normal & appropriate.  No delusions, ideations or obvious mood abnormalities.    Assessment & Plan:   1. Essential hypertension  - Recommended restart Minoxidil 10 mg x 1/4 tab = 2.5 mg /daily and only increase to 1/2 Tb if BP consistently elevated.

## 2015-06-26 ENCOUNTER — Encounter: Payer: Self-pay | Admitting: Internal Medicine

## 2015-06-26 ENCOUNTER — Ambulatory Visit (INDEPENDENT_AMBULATORY_CARE_PROVIDER_SITE_OTHER): Payer: Managed Care, Other (non HMO) | Admitting: Internal Medicine

## 2015-06-26 VITALS — BP 142/90 | HR 84 | Temp 97.9°F | Resp 16 | Ht 71.0 in | Wt 275.6 lb

## 2015-06-26 DIAGNOSIS — E291 Testicular hypofunction: Secondary | ICD-10-CM

## 2015-06-26 DIAGNOSIS — I10 Essential (primary) hypertension: Secondary | ICD-10-CM | POA: Diagnosis not present

## 2015-06-26 DIAGNOSIS — E349 Endocrine disorder, unspecified: Secondary | ICD-10-CM

## 2015-06-26 DIAGNOSIS — E559 Vitamin D deficiency, unspecified: Secondary | ICD-10-CM

## 2015-06-26 DIAGNOSIS — Z79899 Other long term (current) drug therapy: Secondary | ICD-10-CM

## 2015-06-26 DIAGNOSIS — R7303 Prediabetes: Secondary | ICD-10-CM

## 2015-06-26 DIAGNOSIS — E785 Hyperlipidemia, unspecified: Secondary | ICD-10-CM | POA: Diagnosis not present

## 2015-06-26 LAB — HEPATIC FUNCTION PANEL
ALT: 23 U/L (ref 9–46)
AST: 19 U/L (ref 10–40)
Albumin: 3.9 g/dL (ref 3.6–5.1)
Alkaline Phosphatase: 38 U/L — ABNORMAL LOW (ref 40–115)
Bilirubin, Direct: 0.1 mg/dL (ref <=0.2)
Indirect Bilirubin: 0.4 mg/dL (ref 0.2–1.2)
Total Bilirubin: 0.5 mg/dL (ref 0.2–1.2)
Total Protein: 6.9 g/dL (ref 6.1–8.1)

## 2015-06-26 LAB — TSH: TSH: 1.34 mIU/L (ref 0.40–4.50)

## 2015-06-26 LAB — CBC WITH DIFFERENTIAL/PLATELET
Basophils Absolute: 0.1 10*3/uL (ref 0.0–0.1)
Basophils Relative: 1 % (ref 0–1)
Eosinophils Absolute: 0.1 10*3/uL (ref 0.0–0.7)
Eosinophils Relative: 2 % (ref 0–5)
HCT: 45.7 % (ref 39.0–52.0)
Hemoglobin: 15.5 g/dL (ref 13.0–17.0)
Lymphocytes Relative: 35 % (ref 12–46)
Lymphs Abs: 1.9 10*3/uL (ref 0.7–4.0)
MCH: 27.5 pg (ref 26.0–34.0)
MCHC: 33.9 g/dL (ref 30.0–36.0)
MCV: 81.2 fL (ref 78.0–100.0)
MPV: 10.6 fL (ref 8.6–12.4)
Monocytes Absolute: 0.4 10*3/uL (ref 0.1–1.0)
Monocytes Relative: 7 % (ref 3–12)
Neutro Abs: 3 10*3/uL (ref 1.7–7.7)
Neutrophils Relative %: 55 % (ref 43–77)
Platelets: 307 10*3/uL (ref 150–400)
RBC: 5.63 MIL/uL (ref 4.22–5.81)
RDW: 14.3 % (ref 11.5–15.5)
WBC: 5.5 10*3/uL (ref 4.0–10.5)

## 2015-06-26 LAB — BASIC METABOLIC PANEL WITH GFR
BUN: 16 mg/dL (ref 7–25)
CO2: 28 mmol/L (ref 20–31)
Calcium: 9 mg/dL (ref 8.6–10.3)
Chloride: 101 mmol/L (ref 98–110)
Creat: 0.98 mg/dL (ref 0.60–1.35)
GFR, Est African American: >89 mL/min (ref >=60)
GFR, Est Non African American: >89 mL/min (ref >=60)
Glucose, Bld: 102 mg/dL — ABNORMAL HIGH (ref 65–99)
Potassium: 4 mmol/L (ref 3.5–5.3)
Sodium: 140 mmol/L (ref 135–146)

## 2015-06-26 LAB — LIPID PANEL
Cholesterol: 183 mg/dL (ref 125–200)
HDL: 34 mg/dL — ABNORMAL LOW (ref >=40)
LDL Cholesterol: 108 mg/dL (ref <130)
Total CHOL/HDL Ratio: 5.4 Ratio — ABNORMAL HIGH (ref <=5.0)
Triglycerides: 203 mg/dL — ABNORMAL HIGH (ref <150)
VLDL: 41 mg/dL — ABNORMAL HIGH (ref <30)

## 2015-06-26 LAB — MAGNESIUM: Magnesium: 1.9 mg/dL (ref 1.5–2.5)

## 2015-06-27 ENCOUNTER — Encounter: Payer: Self-pay | Admitting: Internal Medicine

## 2015-06-27 LAB — VITAMIN D 25 HYDROXY (VIT D DEFICIENCY, FRACTURES): Vit D, 25-Hydroxy: 41 ng/mL (ref 30–100)

## 2015-06-27 LAB — TESTOSTERONE: Testosterone: 135 ng/dL — ABNORMAL LOW (ref 250–827)

## 2015-06-27 LAB — HEMOGLOBIN A1C
Hgb A1c MFr Bld: 5.9 % — ABNORMAL HIGH (ref <5.7)
Mean Plasma Glucose: 123 mg/dL — ABNORMAL HIGH (ref <117)

## 2015-06-27 NOTE — Patient Instructions (Signed)

## 2015-06-27 NOTE — Progress Notes (Signed)
Patient ID: Nicholas Crosby, male   DOB: 07/03/1965, 50 y.o.   MRN: [id number]   This very nice 50 y.o. [name] presents for 6 month follow up with Hypertension, Hyperlipidemia, Pre-Diabetes, Testosterone  and Vitamin D Deficiency.    Patient is treated for HTN & BP has marginally been controlled at home. Today's BP is 142/90 and patient is advised to increase his Minoxidil from 1/4 to 1/2 tablet daily.  Patient has had no complaints of any cardiac type chest pain, palpitations, dyspnea/orthopnea/PND, dizziness, claudication, or dependent edema.   Hyperlipidemia is not controlled with diet & meds. Patient denies myalgias or other med SE's.  Current Lipids are not at goal with Cholesterol 183; HDL 34*; LDL 108;& elevated Triglycerides 203.    Also, the patient has history of PreDiabetes since 2012 nwith A1c 6.4% and then down to 5.8% in 2014. and has had no symptoms of reactive hypoglycemia, diabetic polys, paresthesias or visual blurring.  Current  A1c is  5.9%.   Patient has hx/o Low T since 2012 with testosterone and still low at 180 in 2014. Further, the patient also has history of Vitamin D Deficiency of "21" in 2008 and supplements vitamin D without any suspected side-effects. Current Vitamin D is still very low at  41.      Medication Sig  . aspirin 81 MG tablet Take 81 mg by mouth daily.  Marland Kitchen atenolol  100 MG tablet Take 1 tablet (100 mg total) by mouth daily. Takes 1 whole tab daily  . VITAMIN D 5000 UNITS  Take 1 tablet (5,000 Units total) by mouth daily.  . enalapril  20 MG tablet Take 1 tablet (20 mg total) by mouth daily.  . hctz 25 MG tablet Take 1 tablet (25 mg total) by mouth daily.  . minoxidil  10 MG tablet TAKE 1/2-1 TABLET BY MOUTH EVERY DAY FOR BLOOD PRESSURE  . pravastatin  20 MG tablet Take 1 tablet (20 mg total) by mouth daily.  . valACYclovir  1000 MG tablet 1 tablet 3 x daily for cld sores / fever b;listers  . verapamil-SR 240 MG CR tablet Take 1 tablet daily with food for BP   . testosterone cypionate 200 MG/ML inj 2cc q 2 weeks    Allergies  Allergen Reactions  . Zocor [Simvastatin] Other (See Comments)    cpk elev  . Crestor [Rosuvastatin] Palpitations   PMHx:   Past Medical History  Diagnosis Date  . Hypertension   . Hyperlipidemia   . Depression    Immunization History  Administered Date(s) Administered  . Influenza-Unspecified 01/17/2015  . Tdap 01/09/2007, 05/18/2015   Past Surgical History  Procedure Laterality Date  . No past surgeries    . Vasectomy  04/06/2012    Procedure: VASECTOMY;  Surgeon: Alexis Frock, MD;  Location: [facility];  Service: Urology;  Laterality: Bilateral;  . Cosmetic surgery      liposuction  . Cyst removal neck     FHx:    Reviewed / unchanged  SHx:    Reviewed / unchanged  Systems Review:  Constitutional: Denies fever, chills, wt changes, headaches, insomnia, fatigue, night sweats, change in appetite. Eyes: Denies redness, blurred vision, diplopia, discharge, itchy, watery eyes.  ENT: Denies discharge, congestion, post nasal drip, epistaxis, sore throat, earache, hearing loss, dental pain, tinnitus, vertigo, sinus pain, snoring.  CV: Denies chest pain, palpitations, irregular heartbeat, syncope, dyspnea, diaphoresis, orthopnea, PND, claudication or edema. Respiratory: denies cough, dyspnea, DOE, pleurisy, hoarseness, laryngitis, wheezing.  Gastrointestinal: Denies dysphagia, odynophagia, heartburn, reflux, water brash, abdominal pain or cramps, nausea, vomiting, bloating, diarrhea, constipation, hematemesis, melena, hematochezia  or hemorrhoids. Genitourinary: Denies dysuria, frequency, urgency, nocturia, hesitancy, discharge, hematuria or flank pain. Musculoskeletal: Denies arthralgias, myalgias, stiffness, jt. swelling, pain, limping or strain/sprain.  Skin: Denies pruritus, rash, hives, warts, acne, eczema or change in skin lesion(s). Neuro: No weakness, tremor, incoordination, spasms,  paresthesia or pain. Psychiatric: Denies confusion, memory loss or sensory loss. Endo: Denies change in weight, skin or hair change.  Heme/Lymph: No excessive bleeding, bruising or enlarged lymph nodes.  Physical Exam  BP 142/90 mmHg  Pulse 84  Temp(Src) 97.9 F (36.6 C)  Resp 16  Ht 5\' 11"  (1.803 m)  Wt 275 lb 9.6 oz (125.011 kg)  BMI 38.46 kg/m2  Appears over nourished with central obesity and in no distress. Eyes: PERRLA, EOMs, conjunctiva no swelling or erythema. Sinuses: No frontal/maxillary tenderness ENT/Mouth: EAC's clear, TM's nl w/o erythema, bulging. Nares clear w/o erythema, swelling, exudates. Oropharynx clear without erythema or exudates. Oral hygiene is good. Tongue normal, non obstructing. Hearing intact.  Neck: Supple. Thyroid nl. Car 2+/2+ without bruits, nodes or JVD. Chest: Respirations nl with BS clear & equal w/o rales, rhonchi, wheezing or stridor.  Cor: Heart sounds normal w/ regular rate and rhythm without sig. murmurs, gallops, clicks, or rubs. Peripheral pulses normal and equal  without edema.  Abdomen: Soft & bowel sounds normal. Non-tender w/o guarding, rebound, hernias, masses, or organomegaly.  Lymphatics: Unremarkable.  Musculoskeletal: Full ROM all peripheral extremities, joint stability, 5/5 strength, and normal gait.  Skin: Warm, dry without exposed rashes, lesions or ecchymosis apparent.  Neuro: Cranial nerves intact, reflexes equal bilaterally. Sensory-motor testing grossly intact. Tendon reflexes grossly intact.  Pysch: Alert & oriented x 3.  Insight and judgement nl & appropriate. No ideations.  Assessment and Plan:  1. Essential hypertension  - TSH  2. Hyperlipidemia  - Lipid panel - TSH  3. Prediabetes  - Hemoglobin A1c - Insulin, random  4. Vitamin D deficiency  - VITAMIN D 25 Hydroxy  5. Testosterone deficiency  - Testosterone  6. Medication management  - CBC with Differential/Platelet - BASIC METABOLIC PANEL WITH  GFR - Hepatic function panel - Magnesium   Recommended regular exercise, BP monitoring, weight control, and discussed med and SE's. Recommended labs to assess and monitor clinical status. Further disposition pending results of labs. Over 30 minutes of exam, counseling, chart review was performed

## 2015-06-29 LAB — INSULIN, RANDOM: Insulin: 14.3 u[IU]/mL (ref 2.0–19.6)

## 2015-06-30 ENCOUNTER — Other Ambulatory Visit: Payer: Self-pay | Admitting: Physician Assistant

## 2015-06-30 MED ORDER — ATENOLOL 100 MG PO TABS: 100.0000 mg | ORAL_TABLET | Freq: Every day | ORAL | Status: DC

## 2015-09-18 ENCOUNTER — Other Ambulatory Visit: Payer: Self-pay | Admitting: *Deleted

## 2015-09-18 DIAGNOSIS — I10 Essential (primary) hypertension: Secondary | ICD-10-CM

## 2015-09-18 MED ORDER — VERAPAMIL HCL ER 240 MG PO TBCR: EXTENDED_RELEASE_TABLET | ORAL | Status: DC

## 2015-10-12 ENCOUNTER — Ambulatory Visit (INDEPENDENT_AMBULATORY_CARE_PROVIDER_SITE_OTHER): Payer: Managed Care, Other (non HMO) | Admitting: Physician Assistant

## 2015-10-12 ENCOUNTER — Encounter: Payer: Self-pay | Admitting: Physician Assistant

## 2015-10-12 VITALS — BP 138/84 | HR 66 | Temp 98.0°F | Resp 18 | Ht 71.0 in | Wt 272.0 lb

## 2015-10-12 DIAGNOSIS — Z79899 Other long term (current) drug therapy: Secondary | ICD-10-CM | POA: Diagnosis not present

## 2015-10-12 DIAGNOSIS — E559 Vitamin D deficiency, unspecified: Secondary | ICD-10-CM

## 2015-10-12 DIAGNOSIS — I10 Essential (primary) hypertension: Secondary | ICD-10-CM | POA: Diagnosis not present

## 2015-10-12 DIAGNOSIS — E291 Testicular hypofunction: Secondary | ICD-10-CM

## 2015-10-12 DIAGNOSIS — E785 Hyperlipidemia, unspecified: Secondary | ICD-10-CM | POA: Diagnosis not present

## 2015-10-12 DIAGNOSIS — E349 Endocrine disorder, unspecified: Secondary | ICD-10-CM

## 2015-10-12 DIAGNOSIS — I480 Paroxysmal atrial fibrillation: Secondary | ICD-10-CM

## 2015-10-12 DIAGNOSIS — R7303 Prediabetes: Secondary | ICD-10-CM

## 2015-10-12 LAB — CBC WITH DIFFERENTIAL/PLATELET
Basophils Absolute: 76 cells/uL (ref 0–200)
Basophils Relative: 1 %
Eosinophils Absolute: 532 cells/uL — ABNORMAL HIGH (ref 15–500)
Eosinophils Relative: 7 %
HCT: 42.9 % (ref 38.5–50.0)
Hemoglobin: 14.2 g/dL (ref 13.2–17.1)
Lymphocytes Relative: 25 %
Lymphs Abs: 1900 cells/uL (ref 850–3900)
MCH: 27.1 pg (ref 27.0–33.0)
MCHC: 33.1 g/dL (ref 32.0–36.0)
MCV: 81.9 fL (ref 80.0–100.0)
MPV: 10.8 fL (ref 7.5–12.5)
Monocytes Absolute: 532 cells/uL (ref 200–950)
Monocytes Relative: 7 %
Neutro Abs: 4560 cells/uL (ref 1500–7800)
Neutrophils Relative %: 60 %
Platelets: 296 10*3/uL (ref 140–400)
RBC: 5.24 MIL/uL (ref 4.20–5.80)
RDW: 15.9 % — ABNORMAL HIGH (ref 11.0–15.0)
WBC: 7.6 10*3/uL (ref 3.8–10.8)

## 2015-10-12 LAB — HEPATIC FUNCTION PANEL
ALT: 13 U/L (ref 9–46)
AST: 13 U/L (ref 10–40)
Albumin: 4.1 g/dL (ref 3.6–5.1)
Alkaline Phosphatase: 46 U/L (ref 40–115)
Bilirubin, Direct: 0.1 mg/dL (ref <=0.2)
Indirect Bilirubin: 0.5 mg/dL (ref 0.2–1.2)
Total Bilirubin: 0.6 mg/dL (ref 0.2–1.2)
Total Protein: 6.9 g/dL (ref 6.1–8.1)

## 2015-10-12 LAB — BASIC METABOLIC PANEL WITH GFR
BUN: 13 mg/dL (ref 7–25)
CO2: 30 mmol/L (ref 20–31)
Calcium: 9.2 mg/dL (ref 8.6–10.3)
Chloride: 102 mmol/L (ref 98–110)
Creat: 0.88 mg/dL (ref 0.60–1.35)
GFR, Est African American: >89 mL/min (ref >=60)
GFR, Est Non African American: >89 mL/min (ref >=60)
Glucose, Bld: 104 mg/dL — ABNORMAL HIGH (ref 65–99)
Potassium: 4.5 mmol/L (ref 3.5–5.3)
Sodium: 139 mmol/L (ref 135–146)

## 2015-10-12 LAB — LIPID PANEL
Cholesterol: 164 mg/dL (ref 125–200)
HDL: 35 mg/dL — ABNORMAL LOW (ref >=40)
LDL Cholesterol: 100 mg/dL (ref <130)
Total CHOL/HDL Ratio: 4.7 Ratio (ref <=5.0)
Triglycerides: 144 mg/dL (ref <150)
VLDL: 29 mg/dL (ref <30)

## 2015-10-12 LAB — HEMOGLOBIN A1C
Hgb A1c MFr Bld: 5.6 % (ref <5.7)
Mean Plasma Glucose: 114 mg/dL

## 2015-10-12 LAB — TSH: TSH: 1.21 mIU/L (ref 0.40–4.50)

## 2015-10-12 LAB — MAGNESIUM: Magnesium: 1.8 mg/dL (ref 1.5–2.5)

## 2015-10-12 MED ORDER — VERAPAMIL HCL ER 240 MG PO TBCR: EXTENDED_RELEASE_TABLET | ORAL | Status: DC

## 2015-10-12 MED ORDER — VITAMIN D3 125 MCG (5000 UT) PO TABS: 5000.0000 [IU] | ORAL_TABLET | Freq: Every day | ORAL | Status: DC

## 2015-10-12 MED ORDER — TESTOSTERONE CYPIONATE 200 MG/ML IM SOLN: INTRAMUSCULAR | Status: DC

## 2015-10-12 MED ORDER — TESTOSTERONE CYPIONATE 200 MG/ML IM SOLN
INTRAMUSCULAR | Status: DC
Start: 2015-10-12 — End: 2015-10-12

## 2015-10-12 NOTE — Patient Instructions (Signed)
Here is some information to help you keep your heart healthy: Move it! - Aim for 30 mins of activity every day. Take it slowly at first. Talk to [place] before starting any new exercise program.   Lose it.  -Body Mass Index (BMI) can indicate if you need to lose weight. A healthy range is 18.5-24.9. For a BMI calculator, go to [employer]  Waist Management -Excess abdominal fat is a risk factor for heart disease, diabetes, asthma, stroke and more. Ideal waist circumference is less than 35" for women and less than 40" for men.   Eat Right -focus on fruits, vegetables, whole grains, and meals you make yourself. Avoid foods with trans fat and high sugar/sodium content.   Snooze or Snore? - Loud snoring can be a sign of sleep apnea, a significant risk factor for high blood pressure, heart attach, stroke, and heart arrhythmias.  Kick the habit -Quit Smoking! Avoid second hand smoke. A single cigarette raises your blood pressure for 20 mins and increases the risk of heart attack and stroke for the next 24 hours.   Are Aspirin and Supplements right for you? -Add ENTERIC COATED low dose 81 mg Aspirin daily OR can do every other day if you have easy bruising to protect your heart and head. As well as to reduce risk of Colon Cancer by 20 %, Skin Cancer by 26 % , Melanoma by 46% and Pancreatic cancer by 60%  Say "No to Stress -There may be little you can do about problems that cause stress. However, techniques such as long walks, meditation, and exercise can help you manage it.   Start Now! - Make changes one at a time and set reasonable goals to increase your likelihood of success.    We want weight loss that will last so you should lose 1-2 pounds a week.  THAT IS IT! Please pick THREE things a month to change. Once it is a habit check off the item. Then pick another three items off the list to become habits.  If you are already doing a habit on the list GREAT!  Cross that item off! o Don't drink  your calories. Ie, alcohol, soda, fruit juice, and sweet tea.  o Drink more water. Drink a glass when you feel hungry or before each meal.  o Eat breakfast - Complex carb and protein (likeDannon light and fit yogurt, oatmeal, fruit, eggs, [place] bacon). o Measure your cereal.  Eat no more than one cup a day. (ie [place]) o Eat an apple a day. o Add a vegetable a day. o Try a new vegetable a month. o Use Pam! Stop using oil or butter to cook. o Don't finish your plate or use smaller plates. o Share your dessert. o Eat sugar free Jello for dessert or frozen grapes. o Don't eat 2-3 hours before bed. o Switch to whole wheat bread, pasta, and brown rice. o Make healthier choices when you eat out. No fries! o Pick baked chicken, NOT fried. o Don't forget to SLOW DOWN when you eat. It is not going anywhere.  o Take the stairs. o Park far away in the parking lot o [employer] (or weights) for 10 minutes while watching TV. o Walk at work for 10 minutes during break. o Walk outside 1 time a week with your friend, kids, dog, or significant other. o Start a walking group at [place] the mall as much as you can tolerate.  o Keep a food diary.  o Weigh yourself daily. o Walk for 15 minutes 3 days per week. o Cook at home more often and eat out less.  If life happens and you go back to old habits, it is okay.  Just start over. You can do it!   If you experience chest pain, get short of breath, or tired during the exercise, please stop immediately and inform your doctor.   9 Ways to Naturally Increase Testosterone Levels  1.   Lose Weight If you're overweight, shedding the excess pounds may increase your testosterone levels, according to research presented at the Endocrine Society's 2012 meeting. Overweight men are more likely to have low testosterone levels to begin with, so this is an important trick to increase your body's testosterone production when you need it most.  2.    High-Intensity Exercise like Peak Fitness  Short intense exercise has a proven positive effect on increasing testosterone levels and preventing its decline. That's unlike aerobics or prolonged moderate exercise, which have shown to have negative or no effect on testosterone levels. Having a whey protein meal after exercise can further enhance the satiety/testosterone-boosting impact (hunger hormones cause the opposite effect on your testosterone and libido). Here's a summary of what a typical high-intensity Peak Fitness routine might look like: " Warm up for three minutes  " Exercise as hard and fast as you can for 30 seconds. You should feel like you couldn't possibly go on another few seconds  " Recover at a slow to moderate pace for 90 seconds  " Repeat the high intensity exercise and recovery 7 more times .  3.   Consume Plenty of Zinc The mineral zinc is important for testosterone production, and supplementing your diet for as little as six weeks has been shown to cause a marked improvement in testosterone among men with low levels.1 Likewise, research has shown that restricting dietary sources of zinc leads to a significant decrease in testosterone, while zinc supplementation increases it2 -- and even protects men from exercised-induced reductions in testosterone levels.3 It's estimated that up to 78 percent of adults over the age of 60 may have lower than recommended zinc intakes; even when dietary supplements were added in, an estimated 20-25 percent of older adults still had inadequate zinc intakes, according to a [employer] and Nutrition Examination Survey.4 Your diet is the best source of zinc; along with protein-rich foods like meats and fish, other good dietary sources of zinc include raw milk, raw cheese, beans, and yogurt or kefir made from raw milk. It can be difficult to obtain enough dietary zinc if you're a vegetarian, and also for meat-eaters as well, largely because of  conventional farming methods that rely heavily on chemical fertilizers and pesticides. These chemicals deplete the soil of nutrients ... nutrients like zinc that must be absorbed by plants in order to be passed on to you. In many cases, you may further deplete the nutrients in your food by the way you prepare it. For most food, cooking it will drastically reduce its levels of nutrients like zinc . particularly over-cooking, which many people do. If you decide to use a zinc supplement, stick to a dosage of less than 40 mg a day, as this is the recommended adult upper limit. Taking too much zinc can interfere with your body's ability to absorb other minerals, especially copper, and may cause nausea as a side effect.  4.   Strength Training In addition to Peak Fitness, strength training is also known to  boost testosterone levels, provided you are doing so intensely enough. When strength training to boost testosterone, you'll want to increase the weight and lower your number of reps, and then focus on exercises that work a large number of muscles, such as dead lifts or squats.  You can "turbo-charge" your weight training by going slower. By slowing down your movement, you're actually turning it into a high-intensity exercise. Super Slow movement allows your muscle, at the microscopic level, to access the maximum number of cross-bridges between the protein filaments that produce movement in the muscle.   5.   Optimize Your Vitamin D Levels Vitamin D, a steroid hormone, is essential for the healthy development of the nucleus of the sperm cell, and helps maintain semen quality and sperm count. Vitamin D also increases levels of testosterone, which may boost libido. In one study, overweight men who were given vitamin D supplements had a significant increase in testosterone levels after one year.5   6.   Reduce Stress When you're under a lot of stress, your body releases high levels of the stress hormone  cortisol. This hormone actually blocks the effects of testosterone,6 presumably because, from a biological standpoint, testosterone-associated behaviors (mating, competing, aggression) may have lowered your chances of survival in an emergency (hence, the "fight or flight" response is dominant, courtesy of cortisol).  7.   Limit or Eliminate Sugar from Your Diet Testosterone levels decrease after you eat sugar, which is likely because the sugar leads to a high insulin level, another factor leading to low testosterone.7 Based on USDA estimates, the average American consumes 12 teaspoons of sugar a day, which equates to about TWO TONS of sugar during a lifetime.  8.   Eat Healthy Fats By healthy, this means not only mon- and polyunsaturated fats, like that found in avocadoes and nuts, but also saturated, as these are essential for building testosterone. Research shows that a diet with less than 40 percent of energy as fat (and that mainly from animal sources, i.e. saturated) lead to a decrease in testosterone levels.8 My personal diet is about 60-70 percent healthy fat, and other experts agree that the ideal diet includes somewhere between 50-70 percent fat.  It's important to understand that your body requires saturated fats from animal and vegetable sources (such as meat, dairy, certain oils, and tropical plants like coconut) for optimal functioning, and if you neglect this important food group in favor of sugar, grains and other starchy carbs, your health and weight are almost guaranteed to suffer. Examples of healthy fats you can eat more of to give your testosterone levels a boost include: Olives and Olive oil  Coconuts and coconut oil Butter made from raw grass-fed organic milk Raw nuts, such as, almonds or pecans Organic pastured egg yolks Avocados Grass-fed meats Palm oil Unheated organic nut oils   9.   Boost Your Intake of Branch Chain Amino Acids (BCAA) from Foods Like [place]  suggests that BCAAs result in higher testosterone levels, particularly when taken along with resistance training.9 While BCAAs are available in supplement form, you'll find the highest concentrations of BCAAs like leucine in dairy products - especially quality cheeses and whey protein. Even when getting leucine from your natural food supply, it's often wasted or used as a building block instead of an anabolic agent. So to create the correct anabolic environment, you need to boost leucine consumption way beyond mere maintenance levels. That said, keep in mind that using leucine as a free form  amino acid can be highly counterproductive as when free form amino acids are artificially administrated, they rapidly enter your circulation while disrupting insulin function, and impairing your body's glycemic control. Food-based leucine is really the ideal form that can benefit your muscles without side effects.

## 2015-10-12 NOTE — Addendum Note (Signed)
Addended by: HELMER, REBECCA A on: 10/12/2015 10:21 AM   Modules accepted: Orders

## 2015-10-12 NOTE — Progress Notes (Signed)
Assessment and Plan:  1. Hypertension -Continue medication, monitor blood pressure at home. Continue DASH diet.  Reminder to go to the ER if any CP, SOB, nausea, dizziness, severe HA, changes vision/speech, left arm numbness and tingling and jaw pain.  2. Cholesterol -Continue diet and exercise. Check cholesterol.   3. Prediabetes  -Continue diet and exercise. Check A1C  4. Vitamin D Def - check level and continue medications.   5. Obesity with co morbidities-  long discussion about weight loss, diet, and exercise  6. Hypogonadism-  continue replacement therapy, check testosterone levels as needed.   7. P Afib Continue ASA, CHADSVASC is 1, no flutter/fib at this this time continue verapamil and atenolol   Continue diet and meds as discussed. Further disposition pending results of labs. Over 30 minutes of exam, counseling, chart review, and critical decision making was performed Future Appointments Date Time Provider [place]  01/19/2016 10:00 AM Unk Pinto, MD [facility] None    HPI 50 y.o. male  presents for 3 month follow up on hypertension, cholesterol, prediabetes, and vitamin D deficiency.   His blood pressure has been controlled at home, today their BP is BP: 138/84 mmHg  He does workout, runs 2-3 x a week. He denies chest pain, shortness of breath, dizziness. Has history of pAfib in feb associated with ETOH, on bASA and verapamil /atenolol, denies any other symptoms.   He is on cholesterol medication and denies myalgias. His cholesterol is at goal. The cholesterol last visit was:   Lab Results  Component Value Date   CHOL 183 06/26/2015   HDL 34* 06/26/2015   LDLCALC 108 06/26/2015   TRIG 203* 06/26/2015   CHOLHDL 5.4* 06/26/2015   He has been working on diet and exercise for prediabetes with unsulin resistance, he has not started the metformin, and denies paresthesia of the feet, polydipsia, polyuria and visual disturbances. Last A1C in the office was:   Lab Results  Component Value Date   HGBA1C 5.9* 06/26/2015  Patient is on Vitamin D supplement.   Lab Results  Component Value Date   VD25OH 41 06/26/2015    He has a history of testosterone deficiency and is on testosterone replacement, does shot every 2 weeks but has not had for 4 weeks. He states that the testosterone helps with his energy, libido, muscle mass. Lab Results  Component Value Date   TESTOSTERONE 135* 06/26/2015  BMI is Body mass index is 37.95 kg/(m^2)., he is working on diet and exercise. Wt Readings from Last 3 Encounters:  10/12/15 272 lb (123.378 kg)  06/26/15 275 lb 9.6 oz (125.011 kg)  06/08/15 275 lb (124.739 kg)     Current Medications:  Current Outpatient Prescriptions on File Prior to Visit  Medication Sig Dispense Refill  . aspirin 81 MG tablet Take 81 mg by mouth daily.    Marland Kitchen atenolol (TENORMIN) 100 MG tablet Take 1 tablet (100 mg total) by mouth daily. Takes 1 whole tab daily 90 tablet 1  . enalapril (VASOTEC) 20 MG tablet Take 1 tablet (20 mg total) by mouth daily. 90 tablet 1  . hydrochlorothiazide (HYDRODIURIL) 25 MG tablet Take 1 tablet (25 mg total) by mouth daily. 90 tablet 3  . minoxidil (LONITEN) 10 MG tablet TAKE 1/2-1 TABLET BY MOUTH EVERY DAY FOR BLOOD PRESSURE  1  . pravastatin (PRAVACHOL) 20 MG tablet Take 1 tablet (20 mg total) by mouth daily. 90 tablet 1  . valACYclovir (VALTREX) 1000 MG tablet 1 tablet 3 x daily  for cld sores / fever b;listers 90 tablet 1   No current facility-administered medications on file prior to visit.   Medical History:  Past Medical History  Diagnosis Date  . Hypertension   . Hyperlipidemia   . Depression    Allergies:  Allergies  Allergen Reactions  . Zocor [Simvastatin] Other (See Comments)    cpk elev  . Crestor [Rosuvastatin] Palpitations     Review of Systems:  Review of Systems  Constitutional: Negative.   HENT: Negative.   Eyes: Negative.   Respiratory: Negative.   Cardiovascular:  Negative.   Gastrointestinal: Negative.   Genitourinary: Negative.   Musculoskeletal: Negative.   Skin: Negative.   Neurological: Negative.   Endo/Heme/Allergies: Negative.   Psychiatric/Behavioral: Negative.     Family history- Review and unchanged Social history- Review and unchanged Physical Exam: BP 138/84 mmHg  Pulse 66  Temp(Src) 98 F (36.7 C) (Temporal)  Resp 18  Ht 5\' 11"  (1.803 m)  Wt 272 lb (123.378 kg)  BMI 37.95 kg/m2 Wt Readings from Last 3 Encounters:  10/12/15 272 lb (123.378 kg)  06/26/15 275 lb 9.6 oz (125.011 kg)  06/08/15 275 lb (124.739 kg)   General Appearance: Well nourished, in no apparent distress. Eyes: PERRLA, EOMs, conjunctiva no swelling or erythema Sinuses: No Frontal/maxillary tenderness ENT/Mouth: Ext aud canals clear, TMs without erythema, bulging. No erythema, swelling, or exudate on post pharynx.  Tonsils not swollen or erythematous. Hearing normal.  Neck: Supple, thyroid normal.  Respiratory: Respiratory effort normal, BS equal bilaterally without rales, rhonchi, wheezing or stridor.  Cardio: RRR with no MRGs. Brisk peripheral pulses without edema.  Abdomen: Soft, + BS,  Non tender, no guarding, rebound, hernias, masses. Lymphatics: Non tender without lymphadenopathy.  Musculoskeletal: Full ROM, 5/5 strength, Normal gait Skin: Warm, dry without rashes, lesions, ecchymosis.  Neuro: Cranial nerves intact. Normal muscle tone, no cerebellar symptoms. Psych: Awake and oriented X 3, normal affect, Insight and Judgment appropriate.    Vicie Mutters, PA-C 9:47 AM [facility]

## 2015-10-13 LAB — VITAMIN D 25 HYDROXY (VIT D DEFICIENCY, FRACTURES): Vit D, 25-Hydroxy: 57 ng/mL (ref 30–100)

## 2015-11-15 ENCOUNTER — Other Ambulatory Visit: Payer: Self-pay | Admitting: Internal Medicine

## 2015-11-15 DIAGNOSIS — I10 Essential (primary) hypertension: Secondary | ICD-10-CM

## 2015-12-23 ENCOUNTER — Other Ambulatory Visit: Payer: Self-pay | Admitting: Internal Medicine

## 2015-12-23 DIAGNOSIS — E782 Mixed hyperlipidemia: Secondary | ICD-10-CM

## 2015-12-28 ENCOUNTER — Other Ambulatory Visit: Payer: Self-pay | Admitting: Internal Medicine

## 2016-01-05 ENCOUNTER — Ambulatory Visit: Payer: Managed Care, Other (non HMO) | Admitting: Internal Medicine

## 2016-01-05 ENCOUNTER — Encounter: Payer: Self-pay | Admitting: Internal Medicine

## 2016-01-05 VITALS — BP 130/90 | HR 75 | Temp 97.5°F | Resp 16 | Ht 71.0 in | Wt 274.0 lb

## 2016-01-05 DIAGNOSIS — N410 Acute prostatitis: Secondary | ICD-10-CM

## 2016-01-05 DIAGNOSIS — I10 Essential (primary) hypertension: Secondary | ICD-10-CM

## 2016-01-05 DIAGNOSIS — B356 Tinea cruris: Secondary | ICD-10-CM

## 2016-01-05 MED ORDER — CIPROFLOXACIN HCL 500 MG PO TABS
ORAL_TABLET | ORAL | 1 refills | Status: DC
Start: 2016-01-05 — End: 2016-01-18

## 2016-01-05 MED ORDER — CLOTRIMAZOLE-BETAMETHASONE 1-0.05 % EX CREA: TOPICAL_CREAM | CUTANEOUS | 3 refills | Status: DC

## 2016-01-05 NOTE — Progress Notes (Signed)
Subjective:    Patient ID: Nicholas Crosby, male    DOB: 02-12-1966, 50 y.o.   MRN: [id number]  HPI  This nice 50 yo [name] with severe labile HTN, HLD, Mod exog obesity, PreDM, Low T  Presents with c/o 10-12 day hx/o bilat aching in his testes - L>R w/o any other specific UT sx's. Also BP's reviewed with noted wide swings from high to low and he relates has tapered his doses of Enalapril and Verapamil in 1/2.   Medication Sig  . aspirin 81 MG tablet Take 81 mg by mouth daily.  Marland Kitchen atenolol (TENORMIN) 100 MG tablet Take 1 tablet (100 mg total) by mouth daily. Takes 1 whole tab daily  . Cholecalciferol (VITAMIN D3) 5000 units TABS Take 1 tablet (5,000 Units total) by mouth daily.  . enalapril (VASOTEC) 20 MG tablet Take 1 tablet (20 mg total) by mouth daily.  . hydrochlorothiazide (HYDRODIURIL) 25 MG tablet Take 1 tablet (25 mg total) by mouth daily.  . minoxidil (LONITEN) 10 MG tablet TAKE 1/2-1 TABLET BY MOUTH EVERY DAY FOR BLOOD PRESSURE  . pravastatin (PRAVACHOL) 20 MG tablet TAKE 1 TABLET BY MOUTH DAILY  . testosterone cypionate (DEPOTESTOSTERONE CYPIONATE) 200 MG/ML injection 2cc q 2 weeks and needs 3 cc syringes And 1" 21 G needles  . valACYclovir (VALTREX) 1000 MG tablet 1 tablet 3 x daily for cld sores / fever b;listers  . verapamil (CALAN-SR) 240 MG CR tablet TAKE 1 TABLET DAILY WITH FOOD  . clotrimazole-betamethasone (LOTRISONE) cream APPLY TOPICALLY TWICE DAILY   Allergies  Allergen Reactions  . Zocor [Simvastatin] Other (See Comments)    cpk elev  . Crestor [Rosuvastatin] Palpitations   Past Medical History:  Diagnosis Date  . Depression   . Hyperlipidemia   . Hypertension    Past Surgical History:  Procedure Laterality Date  . COSMETIC SURGERY     liposuction  . CYST REMOVAL NECK    . NO PAST SURGERIES    . VASECTOMY  04/06/2012   Procedure: VASECTOMY;  Surgeon: Alexis Frock, MD;  Location: [facility];  Service: Urology;  Laterality: Bilateral;    Review of Systems  10 point systems review negative except as above.    Objective:   Physical Exam  BP 130/90   Pulse 75   Temp 97.5 F (36.4 C)   Resp 16   Ht 5\' 11"  (1.803 m)   Wt 274 lb (124.3 kg)   SpO2 96%   BMI 38.22 kg/m   HEENT - Eac's patent. TM's Nl. EOM's full. PERRLA. NasoOroPharynx clear. Neck - supple. Nl Thyroid. Carotids 2+ & No bruits, nodes, JVD Chest - Clear equal BS w/o Rales, rhonchi, wheezes. Cor - Nl HS. RRR w/o sig MGR. PP 1(+). No edema. Abd - No palpable organomegaly, masses or tenderness. BS nl. GU - Neg for ing hernias bilat and cords and testes unremarkable.Noted tinea cruris. DRE - finds Prostate 2+ sl boggy and exam reproduces his testicular aching.  MS- FROM w/o deformities. Muscle power, tone and bulk Nl. Gait Nl. Neuro - No obvious Cr N abnormalities. Sensory, motor and Cerebellar functions appear Nl w/o focal abnormalities.    Assessment & Plan:   1. Acute prostatitis  - ciprofloxacin (CIPRO) 500 MG tablet; Take 1 tablet 2 x / day for prostate infection  Dispense: 42 tablet; Refill: 1  2. Tinea cruris  - clotrimazole-betamethasone (LOTRISONE) cream; Apply to affected area 2 times daily  Dispense: 45 g; Refill: 3  3.  Essential hypertension  - US Renal Artery Stenosis; Future

## 2016-01-05 NOTE — Patient Instructions (Signed)
Prostatitis The prostate gland is about the size and shape of a walnut. It is located just below your bladder. It produces one of the components of semen, which is made up of sperm and the fluids that help nourish and transport it out from the testicles. Prostatitis is inflammation of the prostate gland.  There are four types of prostatitis:  Acute bacterial prostatitis. This is the least common type of prostatitis. It starts quickly and usually is associated with a bladder infection, high fever, and shaking chills. It can occur at any age.  Chronic bacterial prostatitis. This is a persistent bacterial infection in the prostate. It usually develops from repeated acute bacterial prostatitis or acute bacterial prostatitis that was not properly treated. It can occur in men of any age but is most common in middle-aged men whose prostate has begun to enlarge. The symptoms are not as severe as those in acute bacterial prostatitis. Discomfort in the part of your body that is in front of your rectum and below your scrotum (perineum), lower abdomen, or in the head of your penis (glans) may represent your primary discomfort.  Chronic prostatitis (nonbacterial). This is the most common type of prostatitis. It is inflammation of the prostate gland that is not caused by a bacterial infection. The cause is unknown and may be associated with a viral infection or autoimmune disorder.  Prostatodynia (pelvic floor disorder). This is associated with increased muscular tone in the pelvis surrounding the prostate. CAUSES The causes of bacterial prostatitis are bacterial infection. The causes of the other types of prostatitis are unknown.  SYMPTOMS  Symptoms can vary depending upon the type of prostatitis that exists. There can also be overlap in symptoms. Possible symptoms for each type of prostatitis are listed below. Acute Bacterial Prostatitis  Painful urination.  Fever or chills.  Muscle or joint pains.  Low  back pain.  Low abdominal pain.  Inability to empty bladder completely. Chronic Bacterial Prostatitis, Chronic Nonbacterial Prostatitis, and Prostatodynia  Sudden urge to urinate.  Frequent urination.  Difficulty starting urine stream.  Weak urine stream.  Discharge from the urethra.  Dribbling after urination.  Rectal pain.  Pain in the testicles, penis, or tip of the penis.  Pain in the perineum.  Problems with sexual function.  Painful ejaculation.  Bloody semen. DIAGNOSIS  In order to diagnose prostatitis, your health care provider will ask about your symptoms. One or more urine samples will be taken and tested (urinalysis). If the urinalysis result is negative for bacteria, your health care provider may use a finger to feel your prostate (digital rectal exam). This exam helps your health care provider determine if your prostate is swollen and tender. It will also produce a specimen of semen that can be analyzed. TREATMENT  Treatment for prostatitis depends on the cause. If a bacterial infection is the cause, it can be treated with antibiotic medicine. In cases of chronic bacterial prostatitis, the use of antibiotics for up to 1 month or 6 weeks may be necessary. Your health care provider may instruct you to take sitz baths to help relieve pain. A sitz bath is a bath of hot water in which your hips and buttocks are under water. This relaxes the pelvic floor muscles and often helps to relieve the pressure on your prostate. HOME CARE INSTRUCTIONS   Take all medicines as directed by your health care provider.  Take sitz baths as directed by your health care provider. SEEK MEDICAL CARE IF:   Your symptoms   get worse, not better.  You have a fever. SEEK IMMEDIATE MEDICAL CARE IF:   You have chills.  You feel nauseous or vomit.  You feel lightheaded or faint.  You are unable to urinate.  You have blood or blood clots in your urine. MAKE SURE YOU:  Understand  these instructions.  Will watch your condition.  Will get help right away if you are not doing well or get worse.   Document Released: 04/01/2000 Document Revised: 04/25/2014 Document Reviewed: 10/22/2012 Elsevier Interactive Patient Education [employer].

## 2016-01-06 ENCOUNTER — Other Ambulatory Visit: Payer: Self-pay | Admitting: Internal Medicine

## 2016-01-06 DIAGNOSIS — I10 Essential (primary) hypertension: Secondary | ICD-10-CM

## 2016-01-14 ENCOUNTER — Ambulatory Visit (HOSPITAL_COMMUNITY)
Admission: RE | Admit: 2016-01-14 | Discharge: 2016-01-14 | Disposition: A | Discharge status: Home / Self Care | Payer: Managed Care, Other (non HMO) | Source: Ambulatory Visit | Attending: Cardiovascular Disease | Admitting: Cardiovascular Disease

## 2016-01-14 DIAGNOSIS — I10 Essential (primary) hypertension: Secondary | ICD-10-CM | POA: Diagnosis not present

## 2016-01-14 DIAGNOSIS — I714 Abdominal aortic aneurysm, without rupture: Secondary | ICD-10-CM | POA: Diagnosis not present

## 2016-01-18 NOTE — Progress Notes (Signed)
[facility]   Nicholas Crosby, M.D.    Nicholas Crosby. Nicholas Crosby, P.A.-C      [name], P.A.-C  [facility]                [address] [place] [id number] Telephone [phone] Telefax [phone] Annual  Screening/Preventative Visit  & Comprehensive Evaluation & Examination     This very nice 50 y.o. [name] presents for a Screening/Preventative Visit & comprehensive evaluation and management of multiple medical co-morbidities.  Patient has been followed for HTN, Prediabetes, Hyperlipidemia and Vitamin D Deficiency.     HTN predates circa 1993. Patient's BP has been controlled at home.Today's BP is  122/82. Patient denies any cardiac symptoms as chest pain, palpitations, shortness of breath, dizziness or ankle swelling. 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 Patient also had an episode of pAfib and is Chad's1 and he declined therapy opting to take bASA.      Patient's hyperlipidemia is controlled with diet and medications. Patient denies myalgias or other medication SE's. Last lipids were at goal:   Lab Results  Component Value Date   CHOL 164 10/12/2015   HDL 35 (L) 10/12/2015   LDLCALC 100 10/12/2015   TRIG 144 10/12/2015   CHOLHDL 4.7 10/12/2015      Patient has hx/o Morbid Obesity (BMI 39+) and consequent prediabetes with A1c 6.4% in 2012 and then 5.8% in 2014.   Patient denies reactive hypoglycemic symptoms, visual blurring, diabetic polys or paresthesias. Last A1c was at goal:  Lab Results  Component Value Date   HGBA1C 5.6 10/12/2015       Patient had a very low Testosterone of 191 in 2012 and again 150 in 2014 and is currently on replacement wit injections with improved sense of ell-being. Finally, patient has history of Vitamin D Deficiency in 2008 of "21" and last vitamin D was improved: Lab Results  Component Value Date    VD25OH 14 10/12/2015   Current Outpatient Prescriptions on File Prior to Visit  Medication Sig  . aspirin 81 MG Take 81 mg by mouth daily.  Marland Kitchen atenolol  100 MG  Take 1 tablet (100 mg total) by mouth daily. Takes 1 whole tab daily  . VITAMIN D 5000 units  Take 1 tablet (5,000 Units total) by mouth daily.  Marland Kitchen LOTRISONE crm Apply to affected area 2 times daily  . enalapril  20 MG  Take 1 tablet (20 mg total) by mouth daily.  . hctz 25 MG  Take 1 tablet (25 mg total) by mouth daily.  . minoxidil 10 MG  TAKE 1/2-1 TAB EVERY DA  . pravastatin  20 MG tablet TAKE 1 TABLET BY MOUTH DAILY  . testosterone cypio 200 MG/ML injec 2cc q 2 weeks  . valACYclovir  1000 MG tablet 1 tablet 3 x daily for cld sores / fever b;listers  . Verapamil -SR 240 MG CR tablet TAKE 1 TABLET DAILY WITH FOOD   Allergies  Allergen Reactions  . Zocor [Simvastatin] Other (See Comments)    cpk elev  . Crestor [Rosuvastatin] Palpitations   Past Medical History:  Diagnosis Date  . Depression   . Hyperlipidemia   . Hypertension    Health Maintenance  Topic Date Due  . INFLUENZA VACCINE  11/17/2015  .  FOOT EXAM  05/03/2017 (Originally 11/11/1976)  . OPHTHALMOLOGY EXAM  04/24/2018 (Originally 11/11/1976)  . PNEUMOCOCCAL POLYSACCHARIDE VACCINE (1) 04/25/2019 (Originally 11/11/1968)  . HEMOGLOBIN A1C  04/12/2016  . TETANUS/TDAP  05/17/2025  . HIV Screening  Completed   Immunization History  Administered Date(s) Administered  . Influenza-Unspecified 01/17/2015  . Tdap 01/09/2007, 05/18/2015   Past Surgical History:  Procedure Laterality Date  . COSMETIC SURGERY     liposuction  . CYST REMOVAL NECK    . NO PAST SURGERIES    . VASECTOMY  04/06/2012   Procedure: VASECTOMY;  Surgeon: Alexis Frock, MD;  Location: [facility];  Service: Urology;  Laterality: Bilateral;   Family History  Problem Relation Age of Onset  . Hypertension Mother   . Hyperlipidemia Mother   . Diabetes Mother   . Cancer  Mother     breast  . Heart disease Father   . Hyperlipidemia Father   . Hypertension Father    Social History   Social History  . Marital status: Divorced    Spouse name: N/A  . Number of children: N/A  . Years of education: N/A   Occupational History  . 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 Smoking status: Never Smoker  . Smokeless tobacco: Not on file  . Alcohol use 1.2 oz/week    2 Cans of beer per week     Comment: social  . Drug use: No  . Sexual activity: Active    ROS Constitutional: Denies fever, chills, weight loss/gain, headaches, insomnia,  night sweats or change in appetite. Does c/o fatigue. Eyes: Denies redness, blurred vision, diplopia, discharge, itchy or watery eyes.  ENT: Denies discharge, congestion, post nasal drip, epistaxis, sore throat, earache, hearing loss, dental pain, Tinnitus, Vertigo, Sinus pain or snoring.  Cardio: Denies chest pain, palpitations, irregular heartbeat, syncope, dyspnea, diaphoresis, orthopnea, PND, claudication or edema Respiratory: denies cough, dyspnea, DOE, pleurisy, hoarseness, laryngitis or wheezing.  Gastrointestinal: Denies dysphagia, heartburn, reflux, water brash, pain, cramps, nausea, vomiting, bloating, diarrhea, constipation, hematemesis, melena, hematochezia, jaundice or hemorrhoids Genitourinary: Denies dysuria, frequency, urgency, nocturia, hesitancy, discharge, hematuria or flank pain Musculoskeletal: Denies arthralgia, myalgia, stiffness, Jt. Swelling, pain, limp or strain/sprain. Denies Falls. Skin: Denies puritis, rash, hives, warts, acne, eczema or change in skin lesion Neuro: No weakness, tremor, incoordination, spasms, paresthesia or pain Psychiatric: Denies confusion, memory loss or sensory loss. Denies Depression. Endocrine: Denies change in weight, skin, hair change, nocturia, and paresthesia, diabetic polys, visual blurring or hyper / hypo glycemic episodes.  Heme/Lymph:  No excessive bleeding, bruising or enlarged lymph nodes.  Physical Exam  BP 122/82   Pulse 72   Temp 97.5 F (36.4 C)   Resp 16   Ht 5\' 11"  (1.803 m)   Wt 274 lb (124.3 kg)   BMI 38.22 kg/m    General Appearance: Well nourished, in no apparent distress.  Eyes: PERRLA, EOMs, conjunctiva no swelling or erythema, normal fundi and vessels. Sinuses: No frontal/maxillary tenderness ENT/Mouth: EACs patent / TMs  nl. Nares clear without erythema, swelling, mucoid exudates. Oral hygiene is good. No erythema, swelling, or exudate. Tongue normal, non-obstructing. Tonsils not swollen or erythematous. Hearing normal.  Neck: Supple, thyroid normal. No bruits, nodes or JVD. Respiratory: Respiratory effort normal.  BS equal and clear bilateral without rales, rhonci, wheezing or stridor. Cardio: Heart sounds are normal with regular rate and rhythm and no murmurs, rubs or gallops. Peripheral pulses are normal and equal bilaterally  without edema. No aortic or femoral bruits. Chest: symmetric with normal excursions and percussion.  Abdomen: Soft, with Nl bowel sounds. Nontender, no guarding, rebound, hernias, masses, or organomegaly.  Lymphatics: Non tender without lymphadenopathy.  Genitourinary: No hernias.Testes nl. DRE -deferred done recently .  Musculoskeletal: Full ROM all peripheral extremities, joint stability, 5/5 strength, and normal gait. Skin: Warm and dry without rashes, lesions, cyanosis, clubbing or  ecchymosis.  Neuro: Cranial nerves intact, reflexes equal bilaterally. Normal muscle tone, no cerebellar symptoms. Sensation intact.  Pysch: Alert and oriented X 3 with normal affect, insight and judgment appropriate.   Assessment and Plan  1. Annual Preventative/Screening Exam   2. Essential hypertension  - Microalbumin / creatinine urine ratio - EKG 12-Lead - [place], RETROPERITNL ABD,  LTD - TSH  3. Mixed hyperlipidemia  - EKG 12-Lead - [place], RETROPERITNL ABD,  LTD - Lipid panel -  TSH  4. Prediabetes  - EKG 12-Lead - [place], RETROPERITNL ABD,  LTD - Hemoglobin A1c - Insulin, random  5. Vitamin D deficiency  - VITAMIN D 25 Hydroxy   6. Testosterone deficiency  - Testosterone  7. Abdominal aortic aneurysm (AAA) without rupture ([place])   8. Screening for rectal cancer  - POC Hemoccult Bld/Stl   9. Paroxysmal a-fib (HCC)   10. Prostate cancer screening  - PSA  11. Screening for ischemic heart disease  - EKG 12-Lead  12. Screening for AAA (aortic abdominal aneurysm)  - [place], RETROPERITNL ABD,  LTD  13. Other fatigue  - Vitamin B12 - Iron and TIBC - Testosterone - CBC with Differential/Platelet - TSH  14. Medication management  - Microalbumin / creatinine urine ratio - EKG 12-Lead - [place], RETROPERITNL ABD,  LTD - POC Hemoccult Bld/Stl  - Urinalysis, Routine w reflex microscopic  - Vitamin B12 - Iron and TIBC - PSA - Testosterone - CBC with Differential/Platelet - BASIC METABOLIC PANEL WITH GFR - Hepatic function panel - Magnesium - Lipid panel - TSH - Hemoglobin A1c - Insulin, random - VITAMIN D 25 Hydroxy        Continue prudent diet as discussed, weight control, BP monitoring, regular exercise, and medications as discussed.  Discussed med effects and SE's. Routine screening labs and tests as requested with regular follow-up as recommended. Over 40 minutes of exam, counseling, chart review and high complex critical decision making was performed

## 2016-01-18 NOTE — Patient Instructions (Signed)

## 2016-01-19 ENCOUNTER — Encounter: Payer: Self-pay | Admitting: Internal Medicine

## 2016-01-19 ENCOUNTER — Ambulatory Visit (INDEPENDENT_AMBULATORY_CARE_PROVIDER_SITE_OTHER): Payer: Managed Care, Other (non HMO) | Admitting: Internal Medicine

## 2016-01-19 VITALS — BP 122/82 | HR 72 | Temp 97.5°F | Resp 16 | Ht 71.0 in | Wt 274.0 lb

## 2016-01-19 DIAGNOSIS — Z0001 Encounter for general adult medical examination with abnormal findings: Secondary | ICD-10-CM

## 2016-01-19 DIAGNOSIS — I480 Paroxysmal atrial fibrillation: Secondary | ICD-10-CM

## 2016-01-19 DIAGNOSIS — Z0000 Encounter for general adult medical examination without abnormal findings: Secondary | ICD-10-CM | POA: Diagnosis not present

## 2016-01-19 DIAGNOSIS — R5383 Other fatigue: Secondary | ICD-10-CM

## 2016-01-19 DIAGNOSIS — Z125 Encounter for screening for malignant neoplasm of prostate: Secondary | ICD-10-CM

## 2016-01-19 DIAGNOSIS — I10 Essential (primary) hypertension: Secondary | ICD-10-CM | POA: Diagnosis not present

## 2016-01-19 DIAGNOSIS — Z1212 Encounter for screening for malignant neoplasm of rectum: Secondary | ICD-10-CM

## 2016-01-19 DIAGNOSIS — I714 Abdominal aortic aneurysm, without rupture, unspecified: Secondary | ICD-10-CM

## 2016-01-19 DIAGNOSIS — E559 Vitamin D deficiency, unspecified: Secondary | ICD-10-CM | POA: Diagnosis not present

## 2016-01-19 DIAGNOSIS — I700 Atherosclerosis of aorta: Secondary | ICD-10-CM | POA: Insufficient documentation

## 2016-01-19 DIAGNOSIS — E782 Mixed hyperlipidemia: Secondary | ICD-10-CM

## 2016-01-19 DIAGNOSIS — E349 Endocrine disorder, unspecified: Secondary | ICD-10-CM

## 2016-01-19 DIAGNOSIS — Z79899 Other long term (current) drug therapy: Secondary | ICD-10-CM | POA: Diagnosis not present

## 2016-01-19 DIAGNOSIS — Z136 Encounter for screening for cardiovascular disorders: Secondary | ICD-10-CM | POA: Diagnosis not present

## 2016-01-19 DIAGNOSIS — R7303 Prediabetes: Secondary | ICD-10-CM

## 2016-01-19 LAB — CBC WITH DIFFERENTIAL/PLATELET
Basophils Absolute: 71 cells/uL (ref 0–200)
Basophils Relative: 1 %
Eosinophils Absolute: 213 cells/uL (ref 15–500)
Eosinophils Relative: 3 %
HCT: 40.1 % (ref 38.5–50.0)
Hemoglobin: 13.9 g/dL (ref 13.2–17.1)
Lymphocytes Relative: 27 %
Lymphs Abs: 1917 cells/uL (ref 850–3900)
MCH: 27.9 pg (ref 27.0–33.0)
MCHC: 34.7 g/dL (ref 32.0–36.0)
MCV: 80.4 fL (ref 80.0–100.0)
MPV: 9.8 fL (ref 7.5–12.5)
Monocytes Absolute: 639 cells/uL (ref 200–950)
Monocytes Relative: 9 %
Neutro Abs: 4260 cells/uL (ref 1500–7800)
Neutrophils Relative %: 60 %
Platelets: 253 10*3/uL (ref 140–400)
RBC: 4.99 MIL/uL (ref 4.20–5.80)
RDW: 13.6 % (ref 11.0–15.0)
WBC: 7.1 10*3/uL (ref 3.8–10.8)

## 2016-01-19 LAB — TSH: TSH: 1.5 mIU/L (ref 0.40–4.50)

## 2016-01-19 LAB — PSA: PSA: 0.4 ng/mL (ref <=4.0)

## 2016-01-19 LAB — TESTOSTERONE: Testosterone: 908 ng/dL — ABNORMAL HIGH (ref 250–827)

## 2016-01-19 LAB — VITAMIN B12: Vitamin B-12: 314 pg/mL (ref 200–1100)

## 2016-01-20 LAB — MICROALBUMIN / CREATININE URINE RATIO
Creatinine, Urine: 118 mg/dL (ref 20–370)
Microalb Creat Ratio: 4 mcg/mg creat (ref <30)
Microalb, Ur: 0.5 mg/dL

## 2016-01-20 LAB — URINALYSIS, ROUTINE W REFLEX MICROSCOPIC
Bilirubin Urine: NEGATIVE
Glucose, UA: NEGATIVE
Hgb urine dipstick: NEGATIVE
Ketones, ur: NEGATIVE
Leukocytes, UA: NEGATIVE
Nitrite: NEGATIVE
Protein, ur: NEGATIVE
Specific Gravity, Urine: 1.017 (ref 1.001–1.035)
pH: 5 (ref 5.0–8.0)

## 2016-01-20 LAB — LIPID PANEL
Cholesterol: 174 mg/dL (ref 125–200)
HDL: 30 mg/dL — ABNORMAL LOW (ref >=40)
LDL Cholesterol: 108 mg/dL (ref <130)
Total CHOL/HDL Ratio: 5.8 Ratio — ABNORMAL HIGH (ref <=5.0)
Triglycerides: 178 mg/dL — ABNORMAL HIGH (ref <150)
VLDL: 36 mg/dL — ABNORMAL HIGH (ref <30)

## 2016-01-20 LAB — HEPATIC FUNCTION PANEL
ALT: 15 U/L (ref 9–46)
AST: 17 U/L (ref 10–40)
Albumin: 4.1 g/dL (ref 3.6–5.1)
Alkaline Phosphatase: 40 U/L (ref 40–115)
Bilirubin, Direct: 0.1 mg/dL (ref <=0.2)
Indirect Bilirubin: 0.4 mg/dL (ref 0.2–1.2)
Total Bilirubin: 0.5 mg/dL (ref 0.2–1.2)
Total Protein: 6.8 g/dL (ref 6.1–8.1)

## 2016-01-20 LAB — IRON AND TIBC
%SAT: 22 % (ref 15–60)
Iron: 69 ug/dL (ref 50–180)
TIBC: 309 ug/dL (ref 250–425)
UIBC: 240 ug/dL (ref 125–400)

## 2016-01-20 LAB — VITAMIN D 25 HYDROXY (VIT D DEFICIENCY, FRACTURES): Vit D, 25-Hydroxy: 53 ng/mL (ref 30–100)

## 2016-01-20 LAB — BASIC METABOLIC PANEL WITH GFR
BUN: 15 mg/dL (ref 7–25)
CO2: 30 mmol/L (ref 20–31)
Calcium: 9 mg/dL (ref 8.6–10.3)
Chloride: 101 mmol/L (ref 98–110)
Creat: 1.04 mg/dL (ref 0.60–1.35)
GFR, Est African American: >89 mL/min (ref >=60)
GFR, Est Non African American: 84 mL/min (ref >=60)
Glucose, Bld: 101 mg/dL — ABNORMAL HIGH (ref 65–99)
Potassium: 4 mmol/L (ref 3.5–5.3)
Sodium: 139 mmol/L (ref 135–146)

## 2016-01-20 LAB — INSULIN, RANDOM: Insulin: 7.5 u[IU]/mL (ref 2.0–19.6)

## 2016-01-20 LAB — HEMOGLOBIN A1C
Hgb A1c MFr Bld: 6 % — ABNORMAL HIGH (ref <5.7)
Mean Plasma Glucose: 126 mg/dL

## 2016-01-20 LAB — MAGNESIUM: Magnesium: 2 mg/dL (ref 1.5–2.5)

## 2016-01-22 ENCOUNTER — Other Ambulatory Visit: Payer: Self-pay | Admitting: Internal Medicine

## 2016-01-29 ENCOUNTER — Telehealth: Payer: Self-pay | Admitting: *Deleted

## 2016-01-29 NOTE — Telephone Encounter (Signed)
Patient called and asked how long to take the RX for Septra.  Per Dr Melford Aase, he should take 1 tablet 2 times daily for 10 days.  Patient is aware.

## 2016-02-16 ENCOUNTER — Telehealth: Payer: Self-pay | Admitting: *Deleted

## 2016-02-16 NOTE — Telephone Encounter (Signed)
Patient called and reported his prostate problem is not totally relieved on the Septra, but he still has about 10 days of medication left.  Per Dr Melford Aase, finish the Septra and if he still has a problem, he will an OV to recheck

## 2016-02-24 ENCOUNTER — Ambulatory Visit: Payer: Managed Care, Other (non HMO) | Admitting: Internal Medicine

## 2016-02-24 ENCOUNTER — Encounter: Payer: Self-pay | Admitting: Internal Medicine

## 2016-02-24 VITALS — BP 132/84 | HR 60 | Temp 97.7°F | Resp 16 | Ht 71.0 in | Wt 278.8 lb

## 2016-02-24 DIAGNOSIS — N410 Acute prostatitis: Secondary | ICD-10-CM

## 2016-02-24 LAB — PSA: PSA: 0.4 ng/mL (ref <=4.0)

## 2016-02-24 MED ORDER — CIPROFLOXACIN HCL 500 MG PO TABS: 500.0000 mg | ORAL_TABLET | Freq: Two times a day (BID) | ORAL | 0 refills | Status: AC

## 2016-02-24 NOTE — Progress Notes (Signed)
[facility]   Unk Pinto, M.D.    [name], P.A.-C      [name], P.A.-C  [facility]                [address] [place], [place] [id number] Telephone [phone] Telefax [phone]  Subjective:    Patient ID: Nicholas Crosby, male    DOB: Jul 18, 1965, 50 y.o.   MRN: [id number]  HPI This nice 50 yo [name] was treated recently 01/05/2016 for an acute prostatitis with LUTS and was initially treated with Cipro and initially improved and then called on 01/29/2016 reporting sx's were returning and worsening and then he was switched to Septra. Now he reports he has had increasing discomfort in the "prostste area" w/o fever, chills, rash, urethral d/c, dysuria or change in color of urine. Denies voiding difficulties.   Medication Sig  . aspirin 81 MG tablet Take 81 mg by mouth daily.  Marland Kitchen atenolol  100 MG tablet Take 1 tablet (100 mg total) by mouth daily. Takes 1 whole tab daily  . VITAMIN D 5000 units TABS Take 1 tablet (5,000 Units total) by mouth daily.  Marland Kitchen LOTRISONE cream Apply to affected area 2 times daily  . enalapril20 MG tablet Take 1 tablet (20 mg total) by mouth daily.  . hctz 25 MG tablet Take 1 tablet (25 mg total) by mouth daily.  . minoxidil  10 MG tablet TAKE 1/2-1 TABLET BY MOUTH EVERY DAY FOR BLOOD PRESSURE  . pravastatin 20 MG tablet TAKE 1 TABLET BY MOUTH DAILY  . Testosterone 200 MG/ML injec 2cc q 2 weeks and needs 3 cc syringes And 1" 21 G needles  . valACYclovir  1000 MG tablet 1 tablet 3 x daily for cld sores / fever b;listers  . verapamil-SR 240 MG  TAKE 1 TABLET DAILY WITH FOOD   Allergies  Allergen Reactions  . Zocor [Simvastatin] Other (See Comments)    cpk elev  . Crestor [Rosuvastatin] Palpitations   Past Medical History:  Diagnosis Date  . Depression   . Hyperlipidemia   . Hypertension    Review of Systems  10 point systems review negative except as  above.    Objective:   Physical Exam  BP 132/84   Pulse 60   Temp 97.7 F (36.5 C)   Resp 16   Ht 5\' 11"  (1.803 m)   Wt 278 lb 12.8 oz (126.5 kg)   BMI 38.88 kg/m   Skin - Negative exposed. GU - No hernias. Testes Nl. DRE - prostate sl boggy /tender w/o nodularity    Assessment & Plan:   1. Acute prostatitis  - PSA - Urinalysis, Routine w reflex microscopic (not at [facility]) - Urine culture - ciprofloxacin (CIPRO) 500 MG tablet; Take 1 tablet (500 mg total) by mouth 2 (two) times daily.  Dispense: 60 tablet; Refill: 0  2. Severe Morbid Obesity   - Re-addressed issues of prudent diet and exedcises

## 2016-02-24 NOTE — Patient Instructions (Signed)

## 2016-02-25 LAB — URINALYSIS, ROUTINE W REFLEX MICROSCOPIC
Bilirubin Urine: NEGATIVE
Glucose, UA: NEGATIVE
Hgb urine dipstick: NEGATIVE
Ketones, ur: NEGATIVE
Nitrite: NEGATIVE
Protein, ur: NEGATIVE
Specific Gravity, Urine: 1.011 (ref 1.001–1.035)
pH: 5.5 (ref 5.0–8.0)

## 2016-02-25 LAB — URINALYSIS, MICROSCOPIC ONLY
Bacteria, UA: NONE SEEN [HPF]
Casts: NONE SEEN [LPF]
Crystals: NONE SEEN [HPF]
RBC / HPF: NONE SEEN RBC/HPF (ref <=2)
Squamous Epithelial / LPF: NONE SEEN [HPF] (ref <=5)
Yeast: NONE SEEN [HPF]

## 2016-02-26 ENCOUNTER — Other Ambulatory Visit: Payer: Self-pay | Admitting: *Deleted

## 2016-02-26 DIAGNOSIS — Z0001 Encounter for general adult medical examination with abnormal findings: Secondary | ICD-10-CM

## 2016-02-26 DIAGNOSIS — Z79899 Other long term (current) drug therapy: Secondary | ICD-10-CM

## 2016-02-26 DIAGNOSIS — Z1212 Encounter for screening for malignant neoplasm of rectum: Secondary | ICD-10-CM

## 2016-02-26 LAB — POC HEMOCCULT BLD/STL (HOME/3-CARD/SCREEN)
Card #2 Fecal Occult Blod, POC: NEGATIVE
Card #3 Fecal Occult Blood, POC: NEGATIVE
Fecal Occult Blood, POC: NEGATIVE

## 2016-02-26 LAB — URINE CULTURE: Organism ID, Bacteria: NO GROWTH

## 2016-03-18 ENCOUNTER — Other Ambulatory Visit: Payer: Self-pay | Admitting: Internal Medicine

## 2016-03-18 DIAGNOSIS — N320 Bladder-neck obstruction: Secondary | ICD-10-CM

## 2016-03-18 MED ORDER — TAMSULOSIN HCL 0.4 MG PO CAPS: ORAL_CAPSULE | ORAL | 1 refills | Status: DC

## 2016-03-21 ENCOUNTER — Telehealth: Payer: Self-pay | Admitting: *Deleted

## 2016-03-21 NOTE — Telephone Encounter (Signed)
Patient called and states the new RX, Tamsulosin, is causing his BP to be low and he feels dizzy.  Per Dr Melford Aase, reduce all BP medications to 1/2 tablet since Tamsulosin can reduce the BP.  Per patient, he is already taking 1/2 dose of all BP medications except his Atenolol.  Per Dr Melford Aase, 1/2 the Atenolol also and call back if symptoms persist.

## 2016-03-25 ENCOUNTER — Telehealth: Payer: Self-pay | Admitting: *Deleted

## 2016-03-25 MED ORDER — SULFAMETHOXAZOLE-TRIMETHOPRIM 800-160 MG PO TABS: 1.0000 | ORAL_TABLET | Freq: Two times a day (BID) | ORAL | 0 refills | Status: DC

## 2016-03-25 NOTE — Telephone Encounter (Signed)
Patient called and states he is almost out of Septra DS and continues to have a squeezing sensation in his prostate area.  He states he restarted the Tamsulocin 0.4 mg and has improved in regard to the frequent urination.  Per Dr Melford Aase, send in a refill on the Septra DS and the patient  Will call back as needed.

## 2016-03-30 ENCOUNTER — Other Ambulatory Visit: Payer: Self-pay | Admitting: Internal Medicine

## 2016-03-30 DIAGNOSIS — E782 Mixed hyperlipidemia: Secondary | ICD-10-CM

## 2016-03-30 MED ORDER — PRAVASTATIN SODIUM 20 MG PO TABS: 20.0000 mg | ORAL_TABLET | Freq: Every day | ORAL | 0 refills | Status: DC

## 2016-03-31 ENCOUNTER — Telehealth: Payer: Self-pay | Admitting: *Deleted

## 2016-03-31 MED ORDER — PREDNISONE 10 MG PO TABS
ORAL_TABLET | ORAL | 0 refills | Status: DC
Start: 2016-03-31 — End: 2016-05-26

## 2016-03-31 NOTE — Telephone Encounter (Signed)
Patient called and states he has been stuffy with a sore throat since starting Tamsulosin , but has been urinating fewer times a day and his discomfort is slightly better.  Per Dr Melford Aase, send in an RX for Prednisone to help with the scratchy throat.  A Message was left to inform the patient.

## 2016-04-20 ENCOUNTER — Ambulatory Visit (INDEPENDENT_AMBULATORY_CARE_PROVIDER_SITE_OTHER): Payer: Managed Care, Other (non HMO) | Admitting: Internal Medicine

## 2016-04-20 VITALS — BP 122/80 | HR 72 | Temp 97.5°F | Resp 16 | Ht 71.0 in | Wt 276.2 lb

## 2016-04-20 DIAGNOSIS — N320 Bladder-neck obstruction: Secondary | ICD-10-CM

## 2016-04-20 DIAGNOSIS — N401 Enlarged prostate with lower urinary tract symptoms: Secondary | ICD-10-CM

## 2016-04-20 MED ORDER — FINASTERIDE 5 MG PO TABS: ORAL_TABLET | ORAL | 3 refills | Status: DC

## 2016-04-22 ENCOUNTER — Encounter: Payer: Self-pay | Admitting: Internal Medicine

## 2016-04-22 NOTE — Progress Notes (Signed)
      Patient ID: Nicholas Crosby, male    DOB: [birth year], 51 y.o.   MRN: [id number]  HPI     Patient returns for f/u of prostatitis and has completed a 2sd course  of Cipro and then Septra for 1 month and still has minimal  LUTS. He endorses some improvement with the addition of Tamsulosin.   Medication Sig   bASA 81 mg Take  daily.  Marland Kitchen atenolol  100 MG tablet Take 1 tab daily.  Marland Kitchen VITAMIN D 5000 units  Take 1 tab daily.  Marland Kitchen LOTRISONE cream Apply to affected area 2 times daily  . enalapril 20 MG  Take 1 tab daily.  . hctz 25 MG Take 1 tab daily.  . minoxidil  10 MG  TAKE 1/2-1 TAB EVERY DAY FOR BLOOD PRESSURE  . pravastatin  20 MG  Take 1 tab daily.  Marland Kitchen SEPTRA DS 800-160 MG Take 1 tab 2  times daily. Take with food.  . tamsulosin 0.4 MG  Take 1 cap at bedtime for prostate  . testosterone cypio 200 MG 2cc q 2 weeks and needs 3 cc syringes And 1" 21 G needles  . valACYclovir  1000 MG  1 tab 3 x daily for cld sores / fever b;listers  . verapamil-SR 240 MG  TAKE 1 TABLET DAILY WITH FOOD   Allergies  Allergen Reactions  . Zocor [Simvastatin] Other (See Comments)    cpk elev  . Crestor [Rosuvastatin] Palpitations   Past Medical History:  Diagnosis Date  . Depression   . Hyperlipidemia   . Hypertension    Review of Systems  10 point systems review negative except as above.    Objective:   Physical Exam  BP 122/80   Pulse 72   Temp 97.5 F (36.4 C)   Resp 16   Ht 5\' 11"  (1.803 m)   Wt 276 lb 3.2 oz (125.3 kg)   BMI 38.52 kg/m   HEENT - WNL Neck - supple. Nl Thyroid. Carotids 2+ & No bruits, nodes, JVD Chest - Clear. Cor - Nl HS. RRR w/o sig M PP 1(+).                                                                                 GU - DRE - P smooth 1+ firm not sig tender                       MS- FROM. Gait Nl. Neuro - Nl w/o focal abnormalities.    Assessment & Plan:   1. Bladder neck obstruction  - finasteride (PROSCAR) 5 MG tablet; Take 1 tablet daily for Prostate   Dispense: 90 tablet; Refill: 3  2. Benign prostatic hyperplasia with lower urinary tract symptoms, symptom details unspecified  - finasteride (PROSCAR) 5 MG tablet; Take 1 tablet daily for Prostate  Dispense: 90 tablet; Refill: 3  - discussed meds/SE's.

## 2016-04-25 ENCOUNTER — Ambulatory Visit: Payer: Self-pay | Admitting: Internal Medicine

## 2016-05-01 ENCOUNTER — Emergency Department (HOSPITAL_BASED_OUTPATIENT_CLINIC_OR_DEPARTMENT_OTHER)
Admission: EM | Admit: 2016-05-01 | Discharge: 2016-05-01 | Disposition: A | Discharge status: Home / Self Care | Payer: Managed Care, Other (non HMO) | Attending: Emergency Medicine | Admitting: Emergency Medicine

## 2016-05-01 ENCOUNTER — Emergency Department (HOSPITAL_BASED_OUTPATIENT_CLINIC_OR_DEPARTMENT_OTHER): Payer: Managed Care, Other (non HMO)

## 2016-05-01 ENCOUNTER — Encounter (HOSPITAL_BASED_OUTPATIENT_CLINIC_OR_DEPARTMENT_OTHER): Payer: Self-pay | Admitting: *Deleted

## 2016-05-01 DIAGNOSIS — R109 Unspecified abdominal pain: Secondary | ICD-10-CM | POA: Diagnosis present

## 2016-05-01 DIAGNOSIS — I10 Essential (primary) hypertension: Secondary | ICD-10-CM | POA: Diagnosis not present

## 2016-05-01 DIAGNOSIS — Z7982 Long term (current) use of aspirin: Secondary | ICD-10-CM | POA: Insufficient documentation

## 2016-05-01 DIAGNOSIS — K529 Noninfective gastroenteritis and colitis, unspecified: Secondary | ICD-10-CM

## 2016-05-01 DIAGNOSIS — R197 Diarrhea, unspecified: Secondary | ICD-10-CM

## 2016-05-01 DIAGNOSIS — R112 Nausea with vomiting, unspecified: Secondary | ICD-10-CM

## 2016-05-01 HISTORY — DX: Benign prostatic hyperplasia without lower urinary tract symptoms: N40.0

## 2016-05-01 LAB — URINALYSIS, ROUTINE W REFLEX MICROSCOPIC
Bilirubin Urine: NEGATIVE
Glucose, UA: NEGATIVE mg/dL
Hgb urine dipstick: NEGATIVE
Ketones, ur: NEGATIVE mg/dL
Leukocytes, UA: NEGATIVE
Nitrite: NEGATIVE
Protein, ur: NEGATIVE mg/dL
Specific Gravity, Urine: 1.024 (ref 1.005–1.030)
pH: 5.5 (ref 5.0–8.0)

## 2016-05-01 LAB — CBC WITH DIFFERENTIAL/PLATELET
Basophils Absolute: 0 10*3/uL (ref 0.0–0.1)
Basophils Relative: 0 %
Eosinophils Absolute: 0.1 10*3/uL (ref 0.0–0.7)
Eosinophils Relative: 1 %
HCT: 41 % (ref 39.0–52.0)
Hemoglobin: 14.2 g/dL (ref 13.0–17.0)
Lymphocytes Relative: 9 %
Lymphs Abs: 1.1 10*3/uL (ref 0.7–4.0)
MCH: 28 pg (ref 26.0–34.0)
MCHC: 34.6 g/dL (ref 30.0–36.0)
MCV: 80.7 fL (ref 78.0–100.0)
Monocytes Absolute: 0.8 10*3/uL (ref 0.1–1.0)
Monocytes Relative: 6 %
Neutro Abs: 10.7 10*3/uL — ABNORMAL HIGH (ref 1.7–7.7)
Neutrophils Relative %: 84 %
Platelets: 254 10*3/uL (ref 150–400)
RBC: 5.08 MIL/uL (ref 4.22–5.81)
RDW: 13.5 % (ref 11.5–15.5)
WBC: 12.8 10*3/uL — ABNORMAL HIGH (ref 4.0–10.5)

## 2016-05-01 LAB — I-STAT CG4 LACTIC ACID, ED
Lactic Acid, Venous: 2.03 mmol/L (ref 0.5–1.9)
Lactic Acid, Venous: 2.05 mmol/L (ref 0.5–1.9)

## 2016-05-01 LAB — COMPREHENSIVE METABOLIC PANEL
ALT: 37 U/L (ref 17–63)
AST: 31 U/L (ref 15–41)
Albumin: 4.3 g/dL (ref 3.5–5.0)
Alkaline Phosphatase: 40 U/L (ref 38–126)
Anion gap: 11 (ref 5–15)
BUN: 13 mg/dL (ref 6–20)
CO2: 29 mmol/L (ref 22–32)
Calcium: 9 mg/dL (ref 8.9–10.3)
Chloride: 98 mmol/L — ABNORMAL LOW (ref 101–111)
Creatinine, Ser: 0.97 mg/dL (ref 0.61–1.24)
GFR calc Af Amer: >60 mL/min (ref >60)
GFR calc non Af Amer: >60 mL/min (ref >60)
Glucose, Bld: 138 mg/dL — ABNORMAL HIGH (ref 65–99)
Potassium: 3.3 mmol/L — ABNORMAL LOW (ref 3.5–5.1)
Sodium: 138 mmol/L (ref 135–145)
Total Bilirubin: 0.4 mg/dL (ref 0.3–1.2)
Total Protein: 7.7 g/dL (ref 6.5–8.1)

## 2016-05-01 LAB — C DIFFICILE QUICK SCREEN W PCR REFLEX
C Diff antigen: NEGATIVE
C Diff interpretation: NOT DETECTED
C Diff toxin: NEGATIVE

## 2016-05-01 LAB — LIPASE, BLOOD: Lipase: 24 U/L (ref 11–51)

## 2016-05-01 LAB — TSH: TSH: 0.963 u[IU]/mL (ref 0.350–4.500)

## 2016-05-01 MED ORDER — ONDANSETRON 4 MG PO TBDP: ORAL_TABLET | ORAL | 0 refills | Status: DC

## 2016-05-01 MED ORDER — ONDANSETRON 8 MG PO TBDP
8.0000 mg | ORAL_TABLET | Freq: Once | ORAL | Status: AC
  Administered 2016-05-01: 8 mg via ORAL
  Filled 2016-05-01: qty 1

## 2016-05-01 MED ORDER — METRONIDAZOLE 500 MG PO TABS
500.0000 mg | ORAL_TABLET | Freq: Once | ORAL | Status: AC
  Administered 2016-05-01: 500 mg via ORAL
  Filled 2016-05-01: qty 1

## 2016-05-01 MED ORDER — POTASSIUM CHLORIDE CRYS ER 20 MEQ PO TBCR
40.0000 meq | EXTENDED_RELEASE_TABLET | Freq: Once | ORAL | Status: AC
  Administered 2016-05-01: 40 meq via ORAL
  Filled 2016-05-01: qty 2

## 2016-05-01 MED ORDER — SODIUM CHLORIDE 0.9 % IV BOLUS (SEPSIS)
1000.0000 mL | Freq: Once | INTRAVENOUS | Status: AC
  Administered 2016-05-01: 1000 mL via INTRAVENOUS

## 2016-05-01 MED ORDER — ONDANSETRON HCL 4 MG/2ML IJ SOLN
4.0000 mg | Freq: Once | INTRAMUSCULAR | Status: AC
  Administered 2016-05-01: 4 mg via INTRAVENOUS
  Filled 2016-05-01: qty 2

## 2016-05-01 MED ORDER — IBUPROFEN 400 MG PO TABS
600.0000 mg | ORAL_TABLET | Freq: Once | ORAL | Status: AC
  Administered 2016-05-01: 600 mg via ORAL
  Filled 2016-05-01: qty 1

## 2016-05-01 MED ORDER — ACETAMINOPHEN 325 MG PO TABS
650.0000 mg | ORAL_TABLET | Freq: Once | ORAL | Status: AC
  Administered 2016-05-01: 650 mg via ORAL
  Filled 2016-05-01: qty 2

## 2016-05-01 MED ORDER — IOPAMIDOL (ISOVUE-300) INJECTION 61%
100.0000 mL | Freq: Once | INTRAVENOUS | Status: AC | PRN
  Administered 2016-05-01: 100 mL via INTRAVENOUS

## 2016-05-01 MED ORDER — ACETAMINOPHEN 500 MG PO TABS: 1000.0000 mg | ORAL_TABLET | Freq: Once | ORAL | Status: DC

## 2016-05-01 MED ORDER — METRONIDAZOLE 500 MG PO TABS: 500.0000 mg | ORAL_TABLET | Freq: Three times a day (TID) | ORAL | 0 refills | Status: DC

## 2016-05-01 NOTE — ED Notes (Signed)
Dr. Reather Converse into room,  at [facility]

## 2016-05-01 NOTE — ED Triage Notes (Addendum)
Pt c/o n/v/d that started at 2100 last night. Denies being sick prior to tonight.  Pt c/o having chills. Last eaten at 4pm. C/o feeling "light headed" states he has vomited once pta and pt vomited a large amount of brown emesis times 2 in triage. Pt states that  He has had diarrhea 3 times pta. zofran 4mg  IV given at triage. Pt denies any abd pain.

## 2016-05-01 NOTE — ED Notes (Signed)
Critical Lactic Acid 2.05 reported to Dr. Darl Householder and Zack Seal, RN

## 2016-05-01 NOTE — ED Provider Notes (Signed)
  Physical Exam  BP 122/81   Pulse 109   Temp 99.2 F (37.3 C) (Oral)   Resp 16   Ht 5\' 11"  (1.803 m)   Wt 275 lb (124.7 kg)   SpO2 93%   BMI 38.35 kg/m   Physical Exam  ED Course  Procedures  MDM Care assumed at 7 am from Dr. Reather Converse. 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 Low grade temp then had a fever 101 in the ED. Denies chest pain or cough. Has mild diffuse abdominal pain. He has been on multiple courses of abx for prostatitis and finished bactrim several days ago. Given 4 L NS by 7am and still tachy so sign out pending reassessment and CT ab/pel. Tolerated PO in the ED, felt better. HR down to low 100s now. Bp stable. Lactate 2.03, remained stable. Doesn't appear septic. WBC 12.8, UA, CXR unremarkable. CT showed enteritis vs colitis. 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 C diff sent and is still pending. Discussed dc home with flagyl or wait for result then call in flagyl and he wants to take flagyl just in case. Will dc home with zofran as well.       Drenda Freeze, MD 05/01/16 1007

## 2016-05-01 NOTE — ED Provider Notes (Signed)
[facility] Provider Note   CSN: [id number] Arrival date & time: 05/01/16  0122     History   Chief Complaint Chief Complaint  Patient presents with  . Emesis    HPI Nicholas Crosby is a 51 y.o. male.  Patient presents with recurrent vomiting and diarrhea nonbloody since this evening. 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 Obesity history prediabetes. High blood pressure and lipids. Patient vomiting on arrival.    Emesis   Associated symptoms include chills, diarrhea and a fever. Pertinent negatives include no abdominal pain, no arthralgias and no cough.    Past Medical History:  Diagnosis Date  . Depression   . Hyperlipidemia   . Hypertension   . Prostate enlargement     Patient Active Problem List   Diagnosis Date Noted  . Abdominal aortic aneurysm ([place]) 01/19/2016  . Paroxysmal a-fib ([place]) 10/12/2015  . BMI 38.0-38.9,adult 03/02/2015  . Medication management 12/03/2013  . Morbid obesity (BMI 37.55) 08/05/2013  . Prediabetes 04/29/2013  . Hypertension   . Hyperlipidemia   . Testosterone deficiency   . Vitamin D deficiency   . Depression, controlled     Past Surgical History:  Procedure Laterality Date  . COSMETIC SURGERY     liposuction  . CYST REMOVAL NECK    . NO PAST SURGERIES    . VASECTOMY  04/06/2012   Procedure: VASECTOMY;  Surgeon: Alexis Frock, MD;  Location: [facility];  Service: Urology;  Laterality: Bilateral;       Home Medications    Prior to Admission medications   Medication Sig Start Date End Date Taking? Authorizing Provider  aspirin 81 MG tablet Take 81 mg by mouth daily.    Historical Provider, MD  atenolol (TENORMIN) 100 MG tablet Take 1 tablet (100 mg total) by mouth daily. Takes 1 whole tab daily 06/30/15   Vicie Mutters, PA-C  Cholecalciferol (VITAMIN D3) 5000 units TABS Take 1 tablet (5,000 Units total) by mouth daily. 10/12/15   Vicie Mutters, PA-C    clotrimazole-betamethasone (LOTRISONE) cream Apply to affected area 2 times daily 01/05/16 01/04/17  Unk Pinto, MD  enalapril (VASOTEC) 20 MG tablet Take 1 tablet (20 mg total) by mouth daily. 06/08/15   Unk Pinto, MD  finasteride (PROSCAR) 5 MG tablet Take 1 tablet daily for Prostate 04/20/16 04/20/17  Unk Pinto, MD  hydrochlorothiazide (HYDRODIURIL) 25 MG tablet Take 1 tablet (25 mg total) by mouth daily. 09/30/14   Unk Pinto, MD  minoxidil (LONITEN) 10 MG tablet TAKE 1/2-1 TABLET BY MOUTH EVERY DAY FOR BLOOD PRESSURE 01/22/16   Unk Pinto, MD  ondansetron (ZOFRAN ODT) 4 MG disintegrating tablet 4mg  ODT q4 hours prn nausea/vomit 05/01/16   Elnora Morrison, MD  pravastatin (PRAVACHOL) 20 MG tablet Take 1 tablet (20 mg total) by mouth daily. 03/30/16   Vicie Mutters, PA-C  predniSONE (DELTASONE) 10 MG tablet Take 1 tab tid x 3 days, 1 tab bid x 3 days then, 1 tab daily x 5 days Patient not taking: Reported on 04/20/2016 03/31/16   Unk Pinto, MD  sulfamethoxazole-trimethoprim (BACTRIM DS,SEPTRA DS) 800-160 MG tablet Take 1 tablet by mouth 2 (two) times daily. Take with food. 03/25/16   Unk Pinto, MD  tamsulosin [facility]) 0.4 MG CAPS capsule Take 1 capsule at bedtime for prostate 03/18/16   Unk Pinto, MD  testosterone cypionate (DEPOTESTOSTERONE CYPIONATE) 200 MG/ML injection 2cc q 2 weeks and needs 3 cc syringes And 1" 21 G needles  10/12/15 08/05/16  Vicie Mutters, PA-C  valACYclovir (VALTREX) 1000 MG tablet 1 tablet 3 x daily for cld sores / fever b;listers 11/04/14   Unk Pinto, MD  verapamil (CALAN-SR) 240 MG CR tablet TAKE 1 TABLET DAILY WITH FOOD 11/15/15   Unk Pinto, MD    Family History Family History  Problem Relation Age of Onset  . Hypertension Mother   . Hyperlipidemia Mother   . Diabetes Mother   . Cancer Mother     breast  . Heart disease Father   . Hyperlipidemia Father   . Hypertension Father     Social History Social History   Substance Use Topics  . Smoking status: Never Smoker  . Smokeless tobacco: Never Used  . Alcohol use 1.2 oz/week    2 Cans of beer per week     Comment: social     Allergies   Zocor [simvastatin] and Crestor [rosuvastatin]   Review of Systems Review of Systems  Constitutional: Positive for appetite change, chills and fever.  HENT: Negative for ear pain and sore throat.   Eyes: Negative for pain and visual disturbance.  Respiratory: Negative for cough and shortness of breath.   Cardiovascular: Negative for chest pain and palpitations.  Gastrointestinal: Positive for diarrhea, nausea and vomiting. Negative for abdominal pain.  Genitourinary: Negative for dysuria and hematuria.  Musculoskeletal: Negative for arthralgias and back pain.  Skin: Negative for color change and rash.  Neurological: Negative for seizures and syncope.  All other systems reviewed and are negative.    Physical Exam Updated Vital Signs BP 128/85   Pulse 109   Temp 100.2 F (37.9 C) (Oral)   Resp 16   Ht 5\' 11"  (1.803 m)   Wt 275 lb (124.7 kg)   SpO2 90%   BMI 38.35 kg/m   Physical Exam  Constitutional: He appears well-developed and well-nourished.  HENT:  Head: Normocephalic and atraumatic.  Dry mucous membranes  Eyes: Conjunctivae are normal.  Neck: Neck supple.  Cardiovascular: Normal rate and regular rhythm.   No murmur heard. Pulmonary/Chest: Effort normal and breath sounds normal. No respiratory distress.  Abdominal: Soft. There is no tenderness.  Musculoskeletal: He exhibits no edema.  Neurological: He is alert.  Skin: Skin is warm and dry.  Psychiatric: He has a normal mood and affect.  Nursing note and vitals reviewed.    ED Treatments / Results  Labs (all labs ordered are listed, but only abnormal results are displayed) Labs Reviewed  CBC WITH DIFFERENTIAL/PLATELET - Abnormal; Notable for the following:       Result Value   WBC 12.8 (*)    Neutro Abs 10.7 (*)    All  other components within normal limits  COMPREHENSIVE METABOLIC PANEL - Abnormal; Notable for the following:    Potassium 3.3 (*)    Chloride 98 (*)    Glucose, Bld 138 (*)    All other components within normal limits    EKG  EKG Interpretation None       Radiology No results found.  Procedures Procedures (including critical care time)  Medications Ordered in ED Medications  ondansetron (ZOFRAN) injection 4 mg (4 mg Intravenous Given 05/01/16 0201)  sodium chloride 0.9 % bolus 1,000 mL (0 mLs Intravenous Stopped 05/01/16 0255)  acetaminophen (TYLENOL) tablet 650 mg (650 mg Oral Given 05/01/16 [id number])     Initial Impression / Assessment and Plan / ED Course  I have reviewed the triage vital signs and the nursing notes.  Pertinent labs &  imaging results that were available during my care of the patient were reviewed by me and considered in my medical decision making (see chart for details).  Clinical Course    Patient presents with recurrent vomiting and diarrhea. Discussed likely toxin/viral mediated with no focal abdominal discomfort. Plan for blood work, IV fluids, antiemetics and reassessment.  Results and differential diagnosis were discussed with the patient/parent/guardian. Xrays were independently reviewed by myself.  Close follow up outpatient was discussed, comfortable with the plan.   Medications  acetaminophen (TYLENOL) tablet 1,000 mg (not administered)  ondansetron (ZOFRAN) injection 4 mg (4 mg Intravenous Given 05/01/16 0201)  sodium chloride 0.9 % bolus 1,000 mL (0 mLs Intravenous Stopped 05/01/16 0255)  acetaminophen (TYLENOL) tablet 650 mg (650 mg Oral Given 05/01/16 0337)    Vitals:   05/01/16 0230 05/01/16 0300 05/01/16 0330 05/01/16 0333  BP: 141/86 133/88 128/85   Pulse: 97 102 109   Resp:      Temp:    100.2 F (37.9 C)  TempSrc:    Oral  SpO2: 94% 94% 90%   Weight:      Height:        Final diagnoses:  Nausea vomiting and diarrhea     Final Clinical Impressions(s) / ED Diagnoses   Final diagnoses:  Nausea vomiting and diarrhea    New Prescriptions New Prescriptions   ONDANSETRON (ZOFRAN ODT) 4 MG DISINTEGRATING TABLET    4mg  ODT q4 hours prn nausea/vomit     Elnora Morrison, MD 05/09/16 0106

## 2016-05-01 NOTE — Discharge Instructions (Addendum)
Return if your pain localizes, persistent fevers, unable to keep any liquids down for greater than 12 hours or new concerns.  Zofran as needed for vomiting.  Take flagyl 500 mg three times daily for a week. Call tomorrow for C diff results and if negative, can stop taking it.   See your doctor  Return to ER if you have severe abdominal pain, vomiting, dehydration, uncontrolled diarrhea, palpitations, passing out.

## 2016-05-01 NOTE — ED Notes (Signed)
Alert, NAD, calm, interactive, (denies: pain, sob, dizziness), no dyspnea noted.

## 2016-05-01 NOTE — ED Notes (Signed)
Up to b/r ("for void and BM"), steady gait, denies dizziness, urine sample saved.

## 2016-05-01 NOTE — ED Notes (Signed)
Dr. Reather Converse into room, pt updated

## 2016-05-06 LAB — CULTURE, BLOOD (ROUTINE X 2)
Culture: NO GROWTH
Culture: NO GROWTH

## 2016-05-16 ENCOUNTER — Other Ambulatory Visit: Payer: Self-pay | Admitting: Internal Medicine

## 2016-05-16 DIAGNOSIS — N320 Bladder-neck obstruction: Secondary | ICD-10-CM

## 2016-05-26 ENCOUNTER — Ambulatory Visit (INDEPENDENT_AMBULATORY_CARE_PROVIDER_SITE_OTHER): Payer: Managed Care, Other (non HMO) | Admitting: Internal Medicine

## 2016-05-26 VITALS — BP 126/88 | HR 72 | Temp 97.7°F | Resp 16 | Ht 71.0 in | Wt 282.0 lb

## 2016-05-26 DIAGNOSIS — M7661 Achilles tendinitis, right leg: Secondary | ICD-10-CM | POA: Diagnosis not present

## 2016-05-26 MED ORDER — PREDNISONE 20 MG PO TABS: ORAL_TABLET | ORAL | 1 refills | Status: DC

## 2016-05-26 NOTE — Patient Instructions (Signed)
Achilles Tendinitis Rehab Ask your health care provider which exercises are safe for you. Do exercises exactly as told by your health care provider and adjust them as directed. It is normal to feel mild stretching, pulling, tightness, or discomfort as you do these exercises, but you should stop right away if you feel sudden pain or your pain gets worse. Do not begin these exercises until told by your health care provider. Stretching and range of motion exercises These exercises warm up your muscles and joints and improve the movement and flexibility of your ankle. These exercises also help to relieve pain, numbness, and tingling. Exercise A: Standing wall calf stretch, knee straight 1. Stand with your hands against a wall. 2. Extend your __________ leg behind you and bend your front knee slightly. Keep both of your heels on the floor. 3. Point the toes of your back foot slightly inward. 4. Keeping your heels on the floor and your back knee straight, shift your weight toward the wall. Do not allow your back to arch. You should feel a gentle stretch in your calf. 5. Hold this position for seconds. Repeat __________ times. Complete this stretch __________ times per day. Exercise B: Standing wall calf stretch, knee bent 1. Stand with your hands against a wall. 2. Extend your __________ leg behind you, and bend your front knee slightly. Keep both of your heels on the floor. 3. Point the toes of your back foot slightly inward. 4. Keeping your heels on the floor, unlock your back knee so that it is bent. You should feel a gentle stretch deep in your calf. 5. Hold this position for __________ seconds. Repeat __________ times. Complete this stretch __________ times per day. Strengthening exercises These exercises build strength and control of your ankle. Endurance is the ability to use your muscles for a long time, even after they get tired. Exercise C: Plantar flexion with band 1. Sit on the floor with  your __________ leg extended. You may put a pillow under your calf to give your foot more room to move. 2. Loop a rubber exercise band or tube around the ball of your __________ foot. The ball of your foot is on the walking surface, right under your toes. The band or tube should be slightly tense when your foot is relaxed. If the band or tube slips, you can put on your shoe or put a washcloth between the band and your foot to help it stay in place. 3. Slowly point your toes downward, pushing them away from you. 4. Hold this position for __________ seconds. 5. Slowly release the tension in the band or tube, controlling smoothly until your foot is back to the starting position. Repeat __________ times. Complete this exercise __________ times per day. Exercise D: Heel raise with eccentric lower 1. Stand on a step with the balls of your feet. The ball of your foot is on the walking surface, right under your toes.  Do not put your heels on the step.  For balance, rest your hands on the wall or on a railing. 2. Rise up onto the balls of your feet. 3. Keeping your heels up, shift all of your weight to your __________ leg and pick up your other leg. 4. Slowly lower your __________ leg so your heel drops below the level of the step. 5. Put down your foot. If told by your health care provider, build up to:  3 sets of 15 repetitions while keeping your knees straight.  3   sets of 15 repetitions while keeping your knees bent as far as told by your health care provider. Complete this exercise __________ times per day. If this exercise is too easy, try doing it while wearing a backpack with weights in it. Balance exercises These exercises improve or maintain your balance. Balance is important in preventing falls. Exercise E: Single leg stand 1. Without shoes, stand near a railing or in a door frame. Hold on to the railing or door frame as needed. 2. Stand on your __________ foot. Keep your big toe down on  the floor and try to keep your arch lifted. 3. Hold this position for __________ seconds. Repeat __________ times. Complete this exercise __________ times per day. If this exercise is too easy, you can try it with your eyes closed or while standing on a pillow. This information is not intended to replace advice given to you by your health care provider. Make sure you discuss any questions you have with your health care provider. Document Released: 11/03/2004 Document Revised: 12/10/2015 Document Reviewed: 12/09/2014 Elsevier Interactive Patient Education  2017 [employer]. Achilles Tendinitis Achilles tendinitis is inflammation of the tough, cord-like band that attaches the lower muscles of your leg to your heel (Achilles tendon). It is usually caused by overusing the tendon and joint involved.  CAUSES Achilles tendinitis can happen because of:  A sudden increase in exercise or activity (such as running).  Doing the same exercises or activities (such as jumping) over and over.  Not warming up calf muscles before exercising.  Exercising in shoes that are worn out or not made for exercise.  Having arthritis or a bone growth on the back of the heel bone. This can rub against the tendon and hurt the tendon. SIGNS AND SYMPTOMS The most common symptoms are:  Pain in the back of the leg, just above the heel. The pain usually gets worse with exercise and better with rest.  Stiffness or soreness in the back of the leg, especially in the morning.  Swelling of the skin over the Achilles tendon.  Trouble standing on tiptoe. Sometimes, an Achilles tendon tears (ruptures). Symptoms of an Achilles tendon rupture can include:  Sudden, severe pain in the back of the leg.  Trouble putting weight on the foot or walking normally. DIAGNOSIS Achilles tendinitis will be diagnosed based on symptoms and a physical examination. An X-ray may be done to check if another condition is causing your symptoms.  An MRI may be ordered if your health care provider suspects you may have completely torn your tendon, which is called an Achilles tendon rupture.  TREATMENT  Achilles tendinitis usually gets better over time. It can take weeks to months to heal completely. Treatment focuses on treating the symptoms and helping the injury heal. HOME CARE INSTRUCTIONS   Rest your Achilles tendon and avoid activities that cause pain.  Apply ice to the injured area:  Put ice in a plastic bag.  Place a towel between your skin and the bag.  Leave the ice on for 20 minutes, 2-3 times a day  Try to avoid using the tendon (other than gentle range of motion) while the tendon is painful. Do not resume use until instructed by your health care provider. Then begin use gradually. Do not increase use to the point of pain. If pain does develop, decrease use and continue the above measures. Gradually increase activities that do not cause discomfort until you achieve normal use.  Do exercises to make your  calf muscles stronger and more flexible. Your health care provider or physical therapist can recommend exercises for you to do.  Wrap your ankle with an elastic bandage or other wrap. This can help keep your tendon from moving too much. Your health care provider will show you how to wrap your ankle correctly.  Only take over-the-counter or prescription medicines for pain, discomfort, or fever as directed by your health care provider. SEEK MEDICAL CARE IF:   Your pain and swelling increase or pain is uncontrolled with medicines.  You develop new, unexplained symptoms or your symptoms get worse.  You are unable to move your toes or foot.  You develop warmth and swelling in your foot.  You have an unexplained temperature. MAKE SURE YOU:   Understand these instructions.  Will watch your condition.  Will get help right away if you are not doing well or get worse. This information is not intended to replace advice  given to you by your health care provider. Make sure you discuss any questions you have with your health care provider. Document Released: 01/12/2005 Document Revised: 04/25/2014 Document Reviewed: 11/14/2012 Elsevier Interactive Patient Education  2017 [employer].

## 2016-05-28 ENCOUNTER — Encounter: Payer: Self-pay | Admitting: Internal Medicine

## 2016-05-28 NOTE — Progress Notes (Signed)
[facility]   Unk Pinto, M.D.    [name], P.A.-C      [name], P.A.-C  [facility]                [address] [place] [id number] Telephone [phone] Telefax [phone]  Subjective:    Patient ID: Nicholas Crosby, male    DOB: [birth year], 51 y.o.   MRN: [id number]  HPI  Patient relates approx 8 week hx/o R heel pain intermittently. Walking/running exacerbate the pain.     Medication Sig  . aspirin 81 MG tablet Take 81 mg by mouth daily.  Marland Kitchen atenolol (TENORMIN) 100 MG tablet Take 1 tablet (100 mg total) by mouth daily. Takes 1 whole tab daily  . Cholecalciferol (VITAMIN D3) 5000 units TABS Take 1 tablet (5,000 Units total) by mouth daily.  . clotrimazole-betamethasone (LOTRISONE) cream Apply to affected area 2 times daily  . enalapril (VASOTEC) 20 MG tablet Take 1 tablet (20 mg total) by mouth daily.  . finasteride (PROSCAR) 5 MG tablet Take 1 tablet daily for Prostate  . hydrochlorothiazide (HYDRODIURIL) 25 MG tablet Take 1 tablet (25 mg total) by mouth daily.  . metroNIDAZOLE (FLAGYL) 500 MG tablet Take 1 tablet (500 mg total) by mouth 3 (three) times daily.  . minoxidil (LONITEN) 10 MG tablet TAKE 1/2-1 TABLET BY MOUTH EVERY DAY FOR BLOOD PRESSURE  . pravastatin (PRAVACHOL) 20 MG tablet Take 1 tablet (20 mg total) by mouth daily.  . tamsulosin (FLOMAX) 0.4 MG CAPS capsule TAKE 1 CAPSULE AT BEDTIME FOR PROSTATE  . testosterone cypionate (DEPOTESTOSTERONE CYPIONATE) 200 MG/ML injection 2cc q 2 weeks and needs 3 cc syringes And 1" 21 G needles  . valACYclovir (VALTREX) 1000 MG tablet 1 tablet 3 x daily for cld sores / fever b;listers  . verapamil (CALAN-SR) 240 MG CR tablet TAKE 1 TABLET DAILY WITH FOOD  . ondansetron (ZOFRAN ODT) 4 MG disintegrating tablet 4mg  ODT q4 hours prn nausea/vomit  . ondansetron (ZOFRAN ODT) 4 MG disintegrating tablet 4mg  ODT q6 hours prn  nausea/vomit  . predniSONE (DELTASONE) 10 MG tablet Take 1 tab tid x 3 days, 1 tab bid x 3 days then, 1 tab daily x 5 days (Patient not taking: Reported on 04/20/2016)  . sulfamethoxazole-trimethoprim (BACTRIM DS,SEPTRA DS) 800-160 MG tablet Take 1 tablet by mouth 2 (two) times daily. Take with food.   No facility-administered medications prior to visit.    Allergies  Allergen Reactions  . Zocor [Simvastatin] Other (See Comments)    cpk elev  . Crestor [Rosuvastatin] Palpitations   Past Medical History:  Diagnosis Date  . Depression   . Hyperlipidemia   . Hypertension   . Prostate enlargement    Review of Systems    Objective:   Physical Exam  BP 126/88   Pulse 72   Temp 97.7 F (36.5 C)   Resp 16   Ht 5\' 11"  (1.803 m)   Wt 282 lb (127.9 kg)   BMI 39.33 kg/m   Exam directed to the RLE, ankle and foot show patient ambulates w/ antalgic gait. There is point tenderness at the insertion of the achilles tendon on the calcaneous. No STS, ecchymoses or rubs are noted.     Assessment & Plan:   1. Tendonitis, Achilles, right  - predniSONE (DELTASONE) 20 MG tablet; 1 tab 3 x  day for 3 days, then 1 tab 2 x day for 3 days, then 1 tab 1 x day for 5 days  Dispense: 20 tablet; Refill: 1

## 2016-06-16 ENCOUNTER — Other Ambulatory Visit: Payer: Self-pay | Admitting: *Deleted

## 2016-06-16 DIAGNOSIS — E291 Testicular hypofunction: Secondary | ICD-10-CM

## 2016-06-16 MED ORDER — TESTOSTERONE CYPIONATE 200 MG/ML IM SOLN: INTRAMUSCULAR | 3 refills | Status: DC

## 2016-06-18 ENCOUNTER — Emergency Department (HOSPITAL_BASED_OUTPATIENT_CLINIC_OR_DEPARTMENT_OTHER)
Admission: EM | Admit: 2016-06-18 | Discharge: 2016-06-18 | Disposition: A | Discharge status: Home / Self Care | Payer: Managed Care, Other (non HMO) | Attending: Emergency Medicine | Admitting: Emergency Medicine

## 2016-06-18 ENCOUNTER — Encounter (HOSPITAL_BASED_OUTPATIENT_CLINIC_OR_DEPARTMENT_OTHER): Payer: Self-pay | Admitting: Adult Health

## 2016-06-18 ENCOUNTER — Other Ambulatory Visit: Payer: Self-pay

## 2016-06-18 ENCOUNTER — Emergency Department (HOSPITAL_BASED_OUTPATIENT_CLINIC_OR_DEPARTMENT_OTHER): Payer: Managed Care, Other (non HMO)

## 2016-06-18 DIAGNOSIS — I10 Essential (primary) hypertension: Secondary | ICD-10-CM | POA: Diagnosis not present

## 2016-06-18 DIAGNOSIS — Z79899 Other long term (current) drug therapy: Secondary | ICD-10-CM | POA: Diagnosis not present

## 2016-06-18 DIAGNOSIS — R21 Rash and other nonspecific skin eruption: Secondary | ICD-10-CM | POA: Diagnosis present

## 2016-06-18 DIAGNOSIS — B349 Viral infection, unspecified: Secondary | ICD-10-CM | POA: Diagnosis not present

## 2016-06-18 DIAGNOSIS — Z7982 Long term (current) use of aspirin: Secondary | ICD-10-CM | POA: Insufficient documentation

## 2016-06-18 LAB — I-STAT CG4 LACTIC ACID, ED: Lactic Acid, Venous: 1.05 mmol/L (ref 0.5–1.9)

## 2016-06-18 LAB — COMPREHENSIVE METABOLIC PANEL
ALT: 31 U/L (ref 17–63)
AST: 24 U/L (ref 15–41)
Albumin: 3.9 g/dL (ref 3.5–5.0)
Alkaline Phosphatase: 32 U/L — ABNORMAL LOW (ref 38–126)
Anion gap: 8 (ref 5–15)
BUN: 11 mg/dL (ref 6–20)
CO2: 31 mmol/L (ref 22–32)
Calcium: 8.6 mg/dL — ABNORMAL LOW (ref 8.9–10.3)
Chloride: 97 mmol/L — ABNORMAL LOW (ref 101–111)
Creatinine, Ser: 1.18 mg/dL (ref 0.61–1.24)
GFR calc Af Amer: >60 mL/min (ref >60)
GFR calc non Af Amer: >60 mL/min (ref >60)
Glucose, Bld: 104 mg/dL — ABNORMAL HIGH (ref 65–99)
Potassium: 3.3 mmol/L — ABNORMAL LOW (ref 3.5–5.1)
Sodium: 136 mmol/L (ref 135–145)
Total Bilirubin: 0.9 mg/dL (ref 0.3–1.2)
Total Protein: 7.1 g/dL (ref 6.5–8.1)

## 2016-06-18 LAB — CBC WITH DIFFERENTIAL/PLATELET
Basophils Absolute: 0 10*3/uL (ref 0.0–0.1)
Basophils Relative: 0 %
Eosinophils Absolute: 0.3 10*3/uL (ref 0.0–0.7)
Eosinophils Relative: 4 %
HCT: 46.4 % (ref 39.0–52.0)
Hemoglobin: 15.7 g/dL (ref 13.0–17.0)
Lymphocytes Relative: 11 %
Lymphs Abs: 0.9 10*3/uL (ref 0.7–4.0)
MCH: 27.9 pg (ref 26.0–34.0)
MCHC: 33.8 g/dL (ref 30.0–36.0)
MCV: 82.6 fL (ref 78.0–100.0)
Monocytes Absolute: 1.3 10*3/uL — ABNORMAL HIGH (ref 0.1–1.0)
Monocytes Relative: 15 %
Neutro Abs: 5.9 10*3/uL (ref 1.7–7.7)
Neutrophils Relative %: 70 %
Platelets: 194 10*3/uL (ref 150–400)
RBC: 5.62 MIL/uL (ref 4.22–5.81)
RDW: 15.3 % (ref 11.5–15.5)
WBC: 8.5 10*3/uL (ref 4.0–10.5)

## 2016-06-18 LAB — PROTIME-INR
INR: 0.96
Prothrombin Time: 12.8 seconds (ref 11.4–15.2)

## 2016-06-18 LAB — URINALYSIS, ROUTINE W REFLEX MICROSCOPIC
Bilirubin Urine: NEGATIVE
Glucose, UA: NEGATIVE mg/dL
Hgb urine dipstick: NEGATIVE
Ketones, ur: 15 mg/dL — AB
Leukocytes, UA: NEGATIVE
Nitrite: NEGATIVE
Protein, ur: NEGATIVE mg/dL
Specific Gravity, Urine: 1.016 (ref 1.005–1.030)
pH: 5.5 (ref 5.0–8.0)

## 2016-06-18 MED ORDER — ACETAMINOPHEN 325 MG PO TABS
650.0000 mg | ORAL_TABLET | Freq: Once | ORAL | Status: AC
  Administered 2016-06-18: 650 mg via ORAL
  Filled 2016-06-18: qty 2

## 2016-06-18 MED ORDER — SODIUM CHLORIDE 0.9 % IV BOLUS (SEPSIS)
1000.0000 mL | Freq: Once | INTRAVENOUS | Status: AC
  Administered 2016-06-18: 1000 mL via INTRAVENOUS

## 2016-06-18 NOTE — ED Provider Notes (Signed)
[facility] Provider Note   CSN: [id number] Arrival date & time: 06/18/16  1626   By signing my name below, I, Nicholas Crosby, attest that this documentation has been prepared under the direction and in the presence of Nicholas Dessert, MD  Electronically Signed: [name], ED Scribe. 06/18/16. 6:29 PM.   History   Chief Complaint Chief Complaint  Patient presents with  . Fever  . Rash   The history is provided by the patient. No language interpreter was used.   HPI Comments:  Nicholas Crosby is a 51 y.o. male, with a hx of hyperlipidemia and HTN, who presents to the Emergency Department complaining of light-headedness onset today. Pt also reports a fever (tmax 103.2), a dry cough x 2 days, congestion x 2 days, a recent change in diet, mild nausea and a rash to his upper chest, back and under his armpits. He notes he used an old body spray and states he works in [employer]. Pt has taken benadryl at home and tylenol in the ED with mild relief. He denies any pain, SOB, unusual sensation in mouth/throat, current cough, abdominal pain, vomiting, hematuria, pain with urination, generalized body aches, any associated itching or any other associated symptoms. Pt has had his flu shot this year. Pt is a non-smoker.   Past Medical History:  Diagnosis Date  . Depression   . Hyperlipidemia   . Hypertension   . Prostate enlargement     Patient Active Problem List   Diagnosis Date Noted  . Abdominal aortic aneurysm ([place]) 01/19/2016  . Paroxysmal a-fib ([place]) 10/12/2015  . BMI 38.0-38.9,adult 03/02/2015  . Medication management 12/03/2013  . Morbid obesity (BMI 37.55) 08/05/2013  . Prediabetes 04/29/2013  . Hypertension   . Hyperlipidemia   . Testosterone deficiency   . Vitamin D deficiency   . Depression, controlled     Past Surgical History:  Procedure Laterality Date  . COSMETIC SURGERY     liposuction  . CYST REMOVAL NECK    . NO PAST SURGERIES    . VASECTOMY   04/06/2012   Procedure: VASECTOMY;  Surgeon: Alexis Frock, MD;  Location[facility];  Service: Urology;  Laterality: Bilateral;    Home Medications    Prior to Admission medications   Medication Sig Start Date End Date Taking? Authorizing Provider  aspirin 81 MG tablet Take 81 mg by mouth daily.    Historical Provider, MD  atenolol (TENORMIN) 100 MG tablet Take 1 tablet (100 mg total) by mouth daily. Takes 1 whole tab daily 06/30/15   Vicie Mutters, PA-C  Cholecalciferol (VITAMIN D3) 5000 units TABS Take 1 tablet (5,000 Units total) by mouth daily. 10/12/15   Vicie Mutters, PA-C  clotrimazole-betamethasone (LOTRISONE) cream Apply to affected area 2 times daily 01/05/16 01/04/17  Unk Pinto, MD  enalapril (VASOTEC) 20 MG tablet Take 1 tablet (20 mg total) by mouth daily. 06/08/15   Unk Pinto, MD  finasteride (PROSCAR) 5 MG tablet Take 1 tablet daily for Prostate 04/20/16 04/20/17  Unk Pinto, MD  hydrochlorothiazide (HYDRODIURIL) 25 MG tablet Take 1 tablet (25 mg total) by mouth daily. 09/30/14   Unk Pinto, MD  metroNIDAZOLE (FLAGYL) 500 MG tablet Take 1 tablet (500 mg total) by mouth 3 (three) times daily. 05/01/16   Drenda Freeze, MD  minoxidil (LONITEN) 10 MG tablet TAKE 1/2-1 TABLET BY MOUTH EVERY DAY FOR BLOOD PRESSURE 01/22/16   Unk Pinto, MD  pravastatin (PRAVACHOL) 20 MG tablet Take 1 tablet (20  mg total) by mouth daily. 03/30/16   Vicie Mutters, PA-C  predniSONE (DELTASONE) 20 MG tablet 1 tab 3 x day for 3 days, then 1 tab 2 x day for 3 days, then 1 tab 1 x day for 5 days 05/26/16   Unk Pinto, MD  tamsulosin (FLOMAX) 0.4 MG CAPS capsule TAKE 1 CAPSULE AT BEDTIME FOR PROSTATE 05/16/16   Unk Pinto, MD  testosterone cypionate (DEPOTESTOSTERONE CYPIONATE) 200 MG/ML injection 2cc q 2 weeks and needs 3 cc syringes And 1" 21 G needles 06/16/16 04/10/17  Unk Pinto, MD  valACYclovir (VALTREX) 1000 MG tablet 1 tablet 3 x daily for cld sores /  fever b;listers 11/04/14   Unk Pinto, MD  verapamil (CALAN-SR) 240 MG CR tablet TAKE 1 TABLET DAILY WITH FOOD 11/15/15   Unk Pinto, MD    Family History Family History  Problem Relation Age of Onset  . Hypertension Mother   . Hyperlipidemia Mother   . Diabetes Mother   . Cancer Mother     breast  . Heart disease Father   . Hyperlipidemia Father   . Hypertension Father     Social History Social History  Substance Use Topics  . Smoking status: Never Smoker  . Smokeless tobacco: Never Used  . Alcohol use 1.2 oz/week    2 Cans of beer per week     Comment: social     Allergies   Zocor [simvastatin] and Crestor [rosuvastatin]   Review of Systems Review of Systems  Constitutional: Positive for fever.  HENT: Positive for congestion.   Respiratory: Positive for cough.   Gastrointestinal: Positive for nausea. Negative for vomiting.  Musculoskeletal: Negative for myalgias.  Skin: Positive for rash.  Neurological: Positive for light-headedness.  All other systems reviewed and are negative.  Physical Exam Updated Vital Signs BP 150/84 (BP Location: Left Arm)   Pulse 95   Temp (!) 103.2 F (39.6 C) (Oral)   Resp 21   Ht 5\' 11"  (1.803 m)   Wt 265 lb (120.2 kg)   SpO2 97%   BMI 36.96 kg/m   Physical Exam  Constitutional: He is oriented to person, place, and time. He appears well-developed and well-nourished. No distress.  HENT:  Head: Normocephalic and atraumatic.  Eyes: Conjunctivae and EOM are normal.  Neck: Normal range of motion.  Cardiovascular: Normal rate, regular rhythm, normal heart sounds and intact distal pulses.   Pulmonary/Chest: Effort normal and breath sounds normal. No respiratory distress.  Abdominal: Soft. He exhibits no distension. There is no tenderness.  Musculoskeletal: Normal range of motion.  Neurological: He is alert and oriented to person, place, and time.  Skin: Skin is warm and dry. Rash noted.  Blanching, macular rash  confluently over the chest abdomen and back. No vesicles or drainage. Non-tender   Psychiatric: He has a normal mood and affect. Judgment normal.  Nursing note and vitals reviewed.    ED Treatments / Results  DIAGNOSTIC STUDIES:  Oxygen Saturation is 97% on RA, normal by my interpretation.    COORDINATION OF CARE:  6:15 PM Discussed treatment plan with pt at bedside and pt agreed to plan.  Labs (all labs ordered are listed, but only abnormal results are displayed) Labs Reviewed  COMPREHENSIVE METABOLIC PANEL - Abnormal; Notable for the following:       Result Value   Potassium 3.3 (*)    Chloride 97 (*)    Glucose, Bld 104 (*)    Calcium 8.6 (*)    Alkaline Phosphatase  32 (*)    All other components within normal limits  CBC WITH DIFFERENTIAL/PLATELET - Abnormal; Notable for the following:    Monocytes Absolute 1.3 (*)    All other components within normal limits  URINALYSIS, ROUTINE W REFLEX MICROSCOPIC - Abnormal; Notable for the following:    Ketones, ur 15 (*)    All other components within normal limits  CULTURE, BLOOD (ROUTINE X 2)  CULTURE, BLOOD (ROUTINE X 2)  PROTIME-INR  I-STAT CG4 LACTIC ACID, ED    EKG ED ECG REPORT   Date: 06/19/2016  Rate: 93  Rhythm: normal sinus rhythm  QRS Axis: normal  Intervals: normal  ST/T Wave abnormalities: normal  Conduction Disutrbances:none  Narrative Interpretation:   Old EKG Reviewed: none available  I have personally reviewed the EKG tracing and agree with the computerized printout as noted.   Radiology Dg Chest 2 View  Result Date: 06/18/2016 CLINICAL DATA:  Acute allergic reaction. EXAM: CHEST  2 VIEW COMPARISON:  May 01, 2016 FINDINGS: The heart size and mediastinal contours are within normal limits. Both lungs are clear. The visualized skeletal structures are unremarkable. IMPRESSION: No active cardiopulmonary disease. Electronically Signed   By: Dorise Bullion III M.D   On: 06/18/2016 18:17     Procedures Procedures (including critical care time)  Medications Ordered in ED Medications  acetaminophen (TYLENOL) tablet 650 mg (650 mg Oral Given 06/18/16 1710)     Initial Impression / Assessment and Plan / ED Course  I have reviewed the triage vital signs and the nursing notes.  Pertinent labs & imaging results that were available during my care of the patient were reviewed by me and considered in my medical decision making (see chart for details).    Patient presenting with upper respiratory congestion and a fever today up to 103. Patient states at home he was feeling dizzy and fatigued and not himself. He did not know he was running a fever. Upon arrival here his temperature was 103. Patient has no shortness of breath, evidence for pneumonia or abdominal symptoms. Patient did break out in a rash several days ago which is not significantly itchy and located over the trunk wear a T-shirt would be. This is most likely a reaction and he states it's improved after taking Benadryl last night. Feel that this is unrelated to his fever. 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 He did receive a flu shot this year. He is well-appearing and feels much better after having Tylenol for the fever and fluids.  Final Clinical Impressions(s) / ED Diagnoses   Final diagnoses:  Rash and nonspecific skin eruption  Viral illness    New Prescriptions Discharge Medication List as of 06/18/2016  7:17 PM     I personally performed the services described in this documentation, which was scribed in my presence.  The recorded information has been reviewed and considered.     Nicholas Dessert, MD 06/19/16 [phone]

## 2016-06-18 NOTE — ED Notes (Signed)
Pt given d/c instructions as per chart. Verbalizes understanding. No questions. 

## 2016-06-18 NOTE — ED Triage Notes (Signed)
PResents with weakness, "allegery symptoms" sleeping more than usual and rash to full body-the rash and cough, runny nose, sneezing started three days ago, the generalized weakness began today. Pt is febrile 103.2. He works in [employer].

## 2016-06-18 NOTE — Discharge Instructions (Signed)
Make sure you're taking Benadryl every 6 hours as needed for the rash. Take Tylenol or Motrin as needed for fever. Please return if you develop confusion, difficulty walking, shortness of breath, abdominal pain or urinary symptoms.

## 2016-06-23 LAB — CULTURE, BLOOD (ROUTINE X 2)
Culture: NO GROWTH
Culture: NO GROWTH

## 2016-07-06 ENCOUNTER — Other Ambulatory Visit: Payer: Self-pay | Admitting: Internal Medicine

## 2016-07-06 ENCOUNTER — Other Ambulatory Visit: Payer: Self-pay | Admitting: Physician Assistant

## 2016-07-06 DIAGNOSIS — E782 Mixed hyperlipidemia: Secondary | ICD-10-CM

## 2016-07-06 MED ORDER — PRAVASTATIN SODIUM 20 MG PO TABS: 20.0000 mg | ORAL_TABLET | Freq: Every day | ORAL | 1 refills | Status: DC

## 2016-07-08 ENCOUNTER — Ambulatory Visit (INDEPENDENT_AMBULATORY_CARE_PROVIDER_SITE_OTHER): Payer: Managed Care, Other (non HMO) | Admitting: Internal Medicine

## 2016-07-08 ENCOUNTER — Encounter: Payer: Self-pay | Admitting: Internal Medicine

## 2016-07-08 VITALS — BP 146/90 | HR 64 | Temp 97.7°F | Resp 16 | Ht 71.0 in | Wt 272.6 lb

## 2016-07-08 DIAGNOSIS — M7661 Achilles tendinitis, right leg: Secondary | ICD-10-CM | POA: Diagnosis not present

## 2016-07-08 DIAGNOSIS — E349 Endocrine disorder, unspecified: Secondary | ICD-10-CM | POA: Diagnosis not present

## 2016-07-08 MED ORDER — PREDNISONE 20 MG PO TABS: ORAL_TABLET | ORAL | 0 refills | Status: DC

## 2016-07-08 MED ORDER — TESTOSTERONE CYPIONATE 200 MG/ML IM SOLN
400.0000 mg | Freq: Once | INTRAMUSCULAR | Status: AC
  Administered 2016-07-08: 400 mg via INTRAMUSCULAR

## 2016-07-08 NOTE — Patient Instructions (Signed)
Achilles Tendinitis Achilles tendinitis is inflammation of the tough, cord-like band that attaches the lower leg muscles to the heel bone (Achilles tendon). This is usually caused by overusing the tendon and the ankle joint. Achilles tendinitis usually gets better over time with treatment and caring for yourself at home. It can take weeks or months to heal completely. What are the causes? This condition may be caused by:  A sudden increase in exercise or activity, such as running.  Doing the same exercises or activities (such as jumping) over and over.  Not warming up calf muscles before exercising.  Exercising in shoes that are worn out or not made for exercise.  Having arthritis or a bone growth (spur) on the back of the heel bone. This can rub against the tendon and hurt it.  Age-related wear and tear. Tendons become less flexible with age and more likely to be injured. What are the signs or symptoms? Common symptoms of this condition include:  Pain in the Achilles tendon or in the back of the leg, just above the heel. The pain usually gets worse with exercise.  Stiffness or soreness in the back of the leg, especially in the morning.  Swelling of the skin over the Achilles tendon.  Thickening of the tendon.  Bone spurs at the bottom of the Achilles tendon, near the heel.  Trouble standing on tiptoe. How is this diagnosed? This condition is diagnosed based on your symptoms and a physical exam. You may have tests, including:  X-rays.  MRI. How is this treated? The goal of treatment is to relieve symptoms and help your injury heal. Treatment may include:  Decreasing or stopping activities that caused the tendinitis. This may mean switching to low-impact exercises like biking or swimming.  Icing the injured area.  Doing physical therapy, including strengthening and stretching exercises.  NSAIDs to help relieve pain and swelling.  Using supportive shoes, wraps, heel  lifts, or a walking boot (air cast).  Surgery. This may be done if your symptoms do not improve after 6 months.  Using high-energy shock wave impulses to stimulate the healing process (extracorporeal shock wave therapy). This is rare.  Injection of medicines to help relieve inflammation (corticosteroids). This is rare. Follow these instructions at home: If you have an air cast:   Wear the cast as told by your health care provider. Remove it only as told by your health care provider.  Loosen the cast if your toes tingle, become numb, or turn cold and blue. Activity   Gradually return to your normal activities once your health care provider approves. Do not do activities that cause pain.  Consider doing low-impact exercises, like cycling or swimming.  If you have an air cast, ask your health care provider when it is safe for you to drive.  If physical therapy was prescribed, do exercises as told by your health care provider or physical therapist. Managing pain, stiffness, and swelling   Raise (elevate) your foot above the level of your heart while you are sitting or lying down.  Move your toes often to avoid stiffness and to lessen swelling.  If directed, put ice on the injured area:  Put ice in a plastic bag.  Place a towel between your skin and the bag.  Leave the ice on for 20 minutes, 2-3 times a day General instructions   If directed, wrap your foot with an elastic bandage or other wrap. This can help keep your tendon from moving too much  while it heals. Your health care provider will show you how to wrap your foot correctly.  Wear supportive shoes or heel lifts only as told by your health care provider.  Take over-the-counter and prescription medicines only as told by your health care provider.  Keep all follow-up visits as told by your health care provider. This is important. Contact a health care provider if:  You have symptoms that gets worse.  You have pain  that does not get better with medicine.  You develop new, unexplained symptoms.  You develop warmth and swelling in your foot.  You have a fever. Get help right away if:  You have a sudden popping sound or sensation in your Achilles tendon followed by severe pain.  You cannot move your toes or foot.  You cannot put any weight on your foot. Summary  Achilles tendinitis is inflammation of the tough, cord-like band that attaches the lower leg muscles to the heel bone (Achilles tendon).  This condition is usually caused by overusing the tendon and the ankle joint. It can also be caused by arthritis or normal aging.  The most common symptoms of this condition include pain, swelling, or stiffness in the Achilles tendon or in the back of the leg. This condition is usually treated with rest, NSAIDs, and physical therapy.  +++++++++++++++++++++++++++++++++   Achilles Tendinitis Rehab Ask your health care provider which exercises are safe for you. Do exercises exactly as told by your health care provider and adjust them as directed. It is normal to feel mild stretching, pulling, tightness, or discomfort as you do these exercises, but you should stop right away if you feel sudden pain or your pain gets worse. Do not begin these exercises until told by your health care provider. Stretching and range of motion exercises These exercises warm up your muscles and joints and improve the movement and flexibility of your ankle. These exercises also help to relieve pain, numbness, and tingling. Exercise A: Standing wall calf stretch, knee straight   1. Stand with your hands against a wall. 2. Extend your __________ leg behind you and bend your front knee slightly. Keep both of your heels on the floor. 3. Point the toes of your back foot slightly inward. 4. Keeping your heels on the floor and your back knee straight, shift your weight toward the wall. Do not allow your back to arch. You should feel a  gentle stretch in your calf. 5. Hold this position for seconds. Repeat __________ times. Complete this stretch __________ times per day. Exercise B: Standing wall calf stretch, knee bent  1. Stand with your hands against a wall. 2. Extend your __________ leg behind you, and bend your front knee slightly. Keep both of your heels on the floor. 3. Point the toes of your back foot slightly inward. 4. Keeping your heels on the floor, unlock your back knee so that it is bent. You should feel a gentle stretch deep in your calf. 5. Hold this position for __________ seconds. Repeat __________ times. Complete this stretch __________ times per day. Strengthening exercises These exercises build strength and control of your ankle. Endurance is the ability to use your muscles for a long time, even after they get tired. Exercise C: Plantar flexion with band   1. Sit on the floor with your __________ leg extended. You may put a pillow under your calf to give your foot more room to move. 2. Loop a rubber exercise band or tube around the ball  of your __________ foot. The ball of your foot is on the walking surface, right under your toes. The band or tube should be slightly tense when your foot is relaxed. If the band or tube slips, you can put on your shoe or put a washcloth between the band and your foot to help it stay in place. 3. Slowly point your toes downward, pushing them away from you. 4. Hold this position for __________ seconds. 5. Slowly release the tension in the band or tube, controlling smoothly until your foot is back to the starting position. Repeat __________ times. Complete this exercise __________ times per day. Exercise D: Heel raise with eccentric lower   1. Stand on a step with the balls of your feet. The ball of your foot is on the walking surface, right under your toes.  Do not put your heels on the step.  For balance, rest your hands on the wall or on a railing. 2. Rise up onto the  balls of your feet. 3. Keeping your heels up, shift all of your weight to your __________ leg and pick up your other leg. 4. Slowly lower your __________ leg so your heel drops below the level of the step. 5. Put down your foot. If told by your health care provider, build up to:  3 sets of 15 repetitions while keeping your knees straight.  3 sets of 15 repetitions while keeping your knees bent as far as told by your health care provider. Complete this exercise __________ times per day. If this exercise is too easy, try doing it while wearing a backpack with weights in it. Balance exercises These exercises improve or maintain your balance. Balance is important in preventing falls. Exercise E: Single leg stand  1. Without shoes, stand near a railing or in a door frame. Hold on to the railing or door frame as needed. 2. Stand on your __________ foot. Keep your big toe down on the floor and try to keep your arch lifted. 3. Hold this position for __________ seconds. Repeat __________ times. Complete this exercise __________ times per day. If this exercise is too easy, you can try it with your eyes closed or while standing on a pillow. This information is not intended to replace advice given to you by your health care provider. Make sure you discuss any questions you have with your health care provider. Document Released: 11/03/2004 Document Revised: 12/10/2015 Document Reviewed: 12/09/2014 Elsevier Interactive Patient Education  2017 [place]. ++++++++++++++++++++++   Achilles Tendon Rupture With Nonsurgical Treatment  The Achilles tendon is a rope-like cord of tissue that connects the lower leg muscles to the heel. You use this tendon when you push your foot down to walk or run. An Achilles tendon rupture is a tear in this tendon. The injury prevents you from being able to do your regular physical activities. What are the causes? This condition can be caused by sudden stress applied to  the tendon, such as from:  A hit to the tendon.  A forceful and unexpected movement of your toes in an upward direction.  Quickly pushing off the ground with your toes, such as at the start of a race. This condition can also be caused by weakening (degeneration) of the tendon. What increases the risk? You are more likely to develop this condition if:  Your Achilles tendon has been injured more than once.  You have had Achilles tendinitis.  You have a disease that affects the blood vessels of  the tendon (vascular disease).  You have stress on the tendon that happens regularly. What are the signs or symptoms?  Feeling as if you were struck violently in the back of the ankle.  Hearing a pop or crack at the time of injury.  Experiencing severe, sudden pain at the time of injury. Some people with this condition do not feel pain. How is this diagnosed? This condition is usually diagnosed with a physical exam. During the exam, your health care provider will touch the tendon and the structures around it. You may be asked to lie on your stomach or kneel on a chair for part of the exam. You most likely have a ruptured tendon if your foot does not flex. Sometimes imaging tests are done. Tests may include:  An ultrasound.  An MRI. How is this treated? This condition may be treated with surgery or by keeping the area still (immobilized) with a cast, boot, or splint and with rehabilitation. Additional treatment for this condition may involve:  Applying ice to the area.  Taking medicines for pain.  Using crutches and keeping weight off the leg.  Physical therapy to regain strength and range of motion in the ankle. Follow these instructions at home: If you have a splint or boot:   Wear it as told by your health care provider. Remove it only as told by your health care provider.  Loosen it if your toes tingle, become numb, or turn cold and blue.  Keep it clean.  If it is not  waterproof:  Do not let it get wet.  Cover it with a watertight covering when you take a bath or a shower. If you have a cast:   Do not put pressure on any part of the cast or splint until it is fully hardened. This may take several hours.  Do not stick anything inside the cast to scratch your skin. Doing that increases your risk of infection.  Check the skin around the cast every day. Tell your health care provider about any concerns.  You may put lotion on dry skin around the edges of the cast. Do not put lotion on the skin underneath the cast.  Keep the cast clean.  If the cast is not waterproof:  Do not let it get wet.  Cover it with a watertight covering when you take a bath or a shower. Managing pain, stiffness, and swelling   Take over-the-counter and prescription medicines only as told by your health care provider.  Raise (elevate) the injured area above the level of your heart while you are sitting or lying down.  Do not dangle your leg over a chair, couch, or bed.  If directed, put ice on the injured area:  If you have a removable splint, remove it as told by your health care provider.  Put ice in a plastic bag.  Place a towel between your skin and the bag.  Leave the ice on for 20 minutes, 2-3 times a day. Activity   Move about only as instructed by your health care provider.  Avoid using the injured area except to move your toes from time to time.  Do not drive a car until your health care provider tells you it is safe to do so.  Do not return to physical activity or sports until cleared by your health care provider or physical therapist. General instructions   Do not use the injured limb to support your body weight until your health care provider  says that you can. Use crutches as told by your health care provider.  Follow your rehabilitation plan. Do exercises as told by your health care provider or physical therapist.  Avoid stretching your  Achilles tendon for at least 6 months or as told by your physical therapist.  Keep all follow-up visits as told by your health care provider. This is important. Contact a health care provider if:  Your pain and swelling get worse.  Your pain is not controlled by medicines.  You have new, unexplained symptoms.  Your symptoms get worse.  You cannot move your toes or foot.  You develop warmth and swelling in your foot.  You have an unexplained fever. Summary  An Achilles tendon rupture is a tear in the rope-like cord of tissue that connects the lower leg muscles to the heel.  This may be treated by keeping the ankle immobilized, keeping weight off the leg, and taking medicines. Physical therapy and icing can also be used.  Wear your cast, splint, or boot as told by your health care provider. Keep it clean and do not get it wet. This information is not intended to replace advice given to you by your health care provider. Make sure you discuss any questions you have with your health care provider. Document Released: 01/12/2005 Document Revised: 03/24/2016 Document Reviewed: 03/24/2016 Elsevier Interactive Patient Education  2017 [place].  ++++

## 2016-07-08 NOTE — Progress Notes (Signed)
  Subjective:    Patient ID: Nicholas Crosby, male    DOB: 27-Oct-1965, 51 y.o.   MRN: [id number]  HPI  Patient returns for f/u 05/26/16 with R Achilles Tendonitis which he relates to an injury in Dec and was treated at his Feb visit with a prednisone and recommended an elastic ankle support which he has used only rarely.   He has tied some stretching exercises. Pain exacerbates  relative to his activities. Also he requested his Testosterone injection on schedule.   Medication Sig  . aspirin 81 MG tablet Take 81 mg by mouth daily.  Marland Kitchen atenolol (TENORMIN) 100 MG tablet Take 1 tablet (100 mg total) by mouth daily. Takes 1 whole tab daily  . Cholecalciferol (VITAMIN D3) 5000 units TABS Take 1 tablet (5,000 Units total) by mouth daily.  . clotrimazole-betamethasone (LOTRISONE) cream Apply to affected area 2 times daily  . enalapril (VASOTEC) 20 MG tablet Take 1 tablet (20 mg total) by mouth daily.  . finasteride (PROSCAR) 5 MG tablet Take 1 tablet daily for Prostate  . hydrochlorothiazide (HYDRODIURIL) 25 MG tablet Take 1 tablet (25 mg total) by mouth daily.  . minoxidil (LONITEN) 10 MG tablet TAKE 1/2-1 TABLET BY MOUTH EVERY DAY FOR BLOOD PRESSURE  . ondansetron (ZOFRAN-ODT) 4 MG disintegrating tablet   . pravastatin (PRAVACHOL) 20 MG tablet Take 1 tablet (20 mg total) by mouth daily.  Marland Kitchen testosterone cypionate (DEPOTESTOSTERONE CYPIONATE) 200 MG/ML injection 2cc q 2 weeks and needs 3 cc syringes And 1" 21 G needles  . valACYclovir (VALTREX) 1000 MG tablet 1 tablet 3 x daily for cld sores / fever b;listers  . verapamil (CALAN-SR) 240 MG CR tablet TAKE 1 TABLET DAILY WITH FOOD  . metroNIDAZOLE (FLAGYL) 500 MG tablet Take 1 tablet (500 mg total) by mouth 3 (three) times daily.  . tamsulosin (FLOMAX) 0.4 MG CAPS capsule TAKE 1 CAPSULE AT BEDTIME FOR PROSTATE   No facility-administered medications prior to visit.    Allergies  Allergen Reactions  . Zocor [Simvastatin] Other (See Comments)    cpk elev    . Crestor [Rosuvastatin] Palpitations   Past Medical History:  Diagnosis Date  . Depression   . Hyperlipidemia   . Hypertension   . Prostate enlargement    Review of Systems   10 point systems review negative except as above.     Objective:   Physical Exam  BP (!) 146/90   Pulse 64   Temp 97.7 F (36.5 C)   Resp 16   Ht 5\' 11"  (1.803 m)   Wt 272 lb 9.6 oz (123.7 kg)   BMI 38.02 kg/m   Exam focused on LLE . Nl gait. No Limp. Achilles intact with point tenderness at the olecranon insertion.     Assessment & Plan:   1. Tendonitis, Achilles, right  - predniSONE (DELTASONE) 20 MG tablet; 1 tab 3 x day for 3 days, then 1 tab 2 x day for 3 days, then 1 tab 1 x day for 5 days  Dispense: 20 tablet; Refill: 0  - Info reviewed and encouraged use of elastic ankle support.    2. Testosterone deficiency  - testosterone cypio injection 400 mg; Inject 2 mLs (400 mg total) into the muscle once.

## 2016-07-27 NOTE — Progress Notes (Signed)
This very nice 51 y.o. [name] presents for 3 month follow up with Hypertension, Hyperlipidemia, Pre-Diabetes and Vitamin D Deficiency.      Patient is treated for HTN (1993) & BP has been controlled at home. Today's BP is at goal - 112/80. Patient has hx/o very labile elevated BP's and has had a neg Renal Aa U/S in the past.  Patient has had no complaints of any cardiac type chest pain, palpitations, dyspnea/orthopnea/PND, dizziness, claudication, or dependent edema. He does have hx/o of 1 episode of pAfib (CHADsvasc 1) and opted for prophylaxis with bASA.      Hyperlipidemia is not controlled with diet & meds. Patient denies myalgias or other med SE's. Last Lipids were not at goal as well as elevated Trig's.  Lab Results  Component Value Date   CHOL 174 01/19/2016   HDL 30 (L) 01/19/2016   LDLCALC 108 01/19/2016   TRIG 178 (H) 01/19/2016   CHOLHDL 5.8 (H) 01/19/2016      Also, the patient is morbidly obese (BMI 37+) and has history of PreDiabetes (A1c 6.4% in 2012 and later 5.8% in 2014) and has had no symptoms of reactive hypoglycemia, diabetic polys, paresthesias or visual blurring.  Last A1c was still not at goal:  Lab Results  Component Value Date   HGBA1C 6.0 (H) 01/19/2016      Further, the patient also has history of Vitamin D Deficiency and supplements vitamin D without any suspected side-effects. Last vitamin D was still sl low (goal 70-100):  Lab Results  Component Value Date   VD25OH 53 01/19/2016   Current Outpatient Prescriptions on File Prior to Visit  Medication Sig  . aspirin 81 MG tablet Take 81 mg by mouth daily.  . Atenolol 100 MG tablet Take 1 tablet (100 mg total) by mouth daily. Takes 1 whole tab daily  . VITAMIN D 5000 units  Take 1 tablet (5,000 Units total) by mouth daily.  Marland Kitchen LOTRISONE cream Apply to affected area 2 times daily  . enalapril  20 MG tablet Take 1 tablet (20 mg total) by mouth daily.  . finasteride  5 MG tablet Take 1 tablet daily for Prostate   . hctz 25 MG tablet Take 1 tablet (25 mg total) by mouth daily.  . minoxidil  10 MG tablet TAKE 1/2-1 TABEVERY DAY FOR BLOOD PRESSURE  . ondansetron-ODT 4 mg   . pravastatin  20 MG tablet Take 1 tablet (20 mg total) by mouth daily.  Marland Kitchen testosterone cypio 200 MG injec 2cc q 2 weeks and needs 3 cc syringes And 1" 21 G needles  . valACYclovir (VALTREX) 1000 MG tablet 1 tablet 3 x daily for cld sores / fever b;listers  . verapamil-SR 240 MG CR tablet TAKE 1 TABLET DAILY WITH FOOD   Allergies  Allergen Reactions  . Zocor [Simvastatin] Other (See Comments)    cpk elev  . Crestor [Rosuvastatin] Palpitations   PMHx:   Past Medical History:  Diagnosis Date  . Depression   . Hyperlipidemia   . Hypertension   . Prostate enlargement    Immunization History  Administered Date(s) Administered  . Influenza-Unspecified 01/17/2015  . Tdap 01/09/2007, 05/18/2015   Past Surgical History:  Procedure Laterality Date  . COSMETIC SURGERY     liposuction  . CYST REMOVAL NECK    . NO PAST SURGERIES    . VASECTOMY  04/06/2012   Procedure: VASECTOMY;  Surgeon: Alexis Frock, MD;  Location: [facility];  Service: Urology;  Laterality: Bilateral;   FHx:    Reviewed / unchanged  SHx:    Reviewed / unchanged  Systems Review:  Constitutional: Denies fever, chills, wt changes, headaches, insomnia, fatigue, night sweats, change in appetite. Eyes: Denies redness, blurred vision, diplopia, discharge, itchy, watery eyes.  ENT: Denies discharge, congestion, post nasal drip, epistaxis, sore throat, earache, hearing loss, dental pain, tinnitus, vertigo, sinus pain, snoring.  CV: Denies chest pain, palpitations, irregular heartbeat, syncope, dyspnea, diaphoresis, orthopnea, PND, claudication or edema. Respiratory: denies cough, dyspnea, DOE, pleurisy, hoarseness, laryngitis, wheezing.  Gastrointestinal: Denies dysphagia, odynophagia, heartburn, reflux, water brash, abdominal pain or cramps,  nausea, vomiting, bloating, diarrhea, constipation, hematemesis, melena, hematochezia  or hemorrhoids. Genitourinary: Denies dysuria, frequency, urgency, nocturia, hesitancy, discharge, hematuria or flank pain. Musculoskeletal: Denies arthralgias, myalgias, stiffness, jt. swelling, pain, limping or strain/sprain.  Skin: Denies pruritus, rash, hives, warts, acne, eczema or change in skin lesion(s). Neuro: No weakness, tremor, incoordination, spasms, paresthesia or pain. Psychiatric: Denies confusion, memory loss or sensory loss. Endo: Denies change in weight, skin or hair change.  Heme/Lymph: No excessive bleeding, bruising or enlarged lymph nodes.  Physical Exam  BP 112/80   Pulse 68   Temp 97.5 F (36.4 C)   Resp 16   Ht 5\' 11"  (1.803 m)   Wt 268 lb 3.2 oz (121.7 kg)   BMI 37.41 kg/m   Appears well nourished, well groomed  and in no distress.  Eyes: PERRLA, EOMs, conjunctiva no swelling or erythema. Sinuses: No frontal/maxillary tenderness ENT/Mouth: EAC's clear, TM's nl w/o erythema, bulging. Nares clear w/o erythema, swelling, exudates. Oropharynx clear without erythema or exudates. Oral hygiene is good. Tongue normal, non obstructing. Hearing intact.  Neck: Supple. Thyroid nl. Car 2+/2+ without bruits, nodes or JVD. Chest: Respirations nl with BS clear & equal w/o rales, rhonchi, wheezing or stridor.  Cor: Heart sounds normal w/ regular rate and rhythm without sig. murmurs, gallops, clicks or rubs. Peripheral pulses normal and equal  without edema.  Abdomen: Soft & bowel sounds normal. Non-tender w/o guarding, rebound, hernias, masses or organomegaly.  Lymphatics: Unremarkable.  Musculoskeletal: Full ROM all peripheral extremities, joint stability, 5/5 strength and normal gait.  Skin: Warm, dry without exposed rashes, lesions or ecchymosis apparent.  Neuro: Cranial nerves intact, reflexes equal bilaterally. Sensory-motor testing grossly intact. Tendon reflexes grossly intact.    Pysch: Alert & oriented x 3.  Insight and judgement nl & appropriate. No ideations.  Assessment and Plan:  1. Essential hypertension  - Continue medication, monitor blood pressure at home.  - Continue DASH diet. Reminder to go to the ER if any CP,  SOB, nausea, dizziness, severe HA, changes vision/speech,  left arm numbness and tingling and jaw pain.  - CBC with Differential/Platelet - BASIC METABOLIC PANEL WITH GFR - Magnesium - TSH  2. Mixed hyperlipidemia  - Continue diet/meds, exercise,& lifestyle modifications.  - Continue monitor periodic cholesterol/liver & renal functions   - Hepatic function panel - Lipid panel - TSH  3. Prediabetes  - Continue diet, exercise, lifestyle modifications.  - Monitor appropriate labs.  - Hemoglobin A1c - Insulin, random  4. Vitamin D deficiency  - Continue supplementation.  - VITAMIN D 25 Hydroxy   5. Testosterone deficiency  - Testosterone  6. Medication management  - CBC with Differential/Platelet - BASIC METABOLIC PANEL WITH GFR - Hepatic function panel - Magnesium - Lipid panel - TSH - Hemoglobin A1c - Insulin, random - VITAMIN D 25 Hydroxy  - Testosterone  Discussed  regular exercise, BP monitoring, weight control to achieve/maintain BMI less than 25 and discussed med and SE's. Recommended labs to assess and monitor clinical status with further disposition pending results of labs. Over 30 minutes of exam, counseling, chart review was performed.

## 2016-07-28 ENCOUNTER — Ambulatory Visit (INDEPENDENT_AMBULATORY_CARE_PROVIDER_SITE_OTHER): Payer: Managed Care, Other (non HMO) | Admitting: Internal Medicine

## 2016-07-28 ENCOUNTER — Encounter: Payer: Self-pay | Admitting: Internal Medicine

## 2016-07-28 VITALS — BP 112/80 | HR 68 | Temp 97.5°F | Resp 16 | Ht 71.0 in | Wt 268.2 lb

## 2016-07-28 DIAGNOSIS — E559 Vitamin D deficiency, unspecified: Secondary | ICD-10-CM | POA: Diagnosis not present

## 2016-07-28 DIAGNOSIS — E782 Mixed hyperlipidemia: Secondary | ICD-10-CM

## 2016-07-28 DIAGNOSIS — I10 Essential (primary) hypertension: Secondary | ICD-10-CM | POA: Diagnosis not present

## 2016-07-28 DIAGNOSIS — E349 Endocrine disorder, unspecified: Secondary | ICD-10-CM

## 2016-07-28 DIAGNOSIS — R7303 Prediabetes: Secondary | ICD-10-CM | POA: Diagnosis not present

## 2016-07-28 DIAGNOSIS — Z79899 Other long term (current) drug therapy: Secondary | ICD-10-CM

## 2016-07-28 LAB — HEPATIC FUNCTION PANEL
ALT: 17 U/L (ref 9–46)
AST: 17 U/L (ref 10–40)
Albumin: 3.6 g/dL (ref 3.6–5.1)
Alkaline Phosphatase: 36 U/L — ABNORMAL LOW (ref 40–115)
Bilirubin, Direct: 0.1 mg/dL (ref <=0.2)
Indirect Bilirubin: 0.6 mg/dL (ref 0.2–1.2)
Total Bilirubin: 0.7 mg/dL (ref 0.2–1.2)
Total Protein: 6.5 g/dL (ref 6.1–8.1)

## 2016-07-28 LAB — CBC WITH DIFFERENTIAL/PLATELET
Basophils Absolute: 0 cells/uL (ref 0–200)
Basophils Relative: 0 %
Eosinophils Absolute: 141 cells/uL (ref 15–500)
Eosinophils Relative: 1 %
HCT: 46.5 % (ref 38.5–50.0)
Hemoglobin: 15.4 g/dL (ref 13.2–17.1)
Lymphocytes Relative: 16 %
Lymphs Abs: 2256 cells/uL (ref 850–3900)
MCH: 27.4 pg (ref 27.0–33.0)
MCHC: 33.1 g/dL (ref 32.0–36.0)
MCV: 82.7 fL (ref 80.0–100.0)
MPV: 10.2 fL (ref 7.5–12.5)
Monocytes Absolute: 1128 cells/uL — ABNORMAL HIGH (ref 200–950)
Monocytes Relative: 8 %
Neutro Abs: 10575 cells/uL — ABNORMAL HIGH (ref 1500–7800)
Neutrophils Relative %: 75 %
Platelets: 243 10*3/uL (ref 140–400)
RBC: 5.62 MIL/uL (ref 4.20–5.80)
RDW: 15 % (ref 11.0–15.0)
WBC: 14.1 10*3/uL — ABNORMAL HIGH (ref 3.8–10.8)

## 2016-07-28 LAB — BASIC METABOLIC PANEL WITH GFR
BUN: 11 mg/dL (ref 7–25)
CO2: 31 mmol/L (ref 20–31)
Calcium: 8.8 mg/dL (ref 8.6–10.3)
Chloride: 98 mmol/L (ref 98–110)
Creat: 1.02 mg/dL (ref 0.60–1.35)
GFR, Est African American: >89 mL/min (ref >=60)
GFR, Est Non African American: 86 mL/min (ref >=60)
Glucose, Bld: 90 mg/dL (ref 65–99)
Potassium: 4 mmol/L (ref 3.5–5.3)
Sodium: 138 mmol/L (ref 135–146)

## 2016-07-28 LAB — LIPID PANEL
Cholesterol: 179 mg/dL (ref <200)
HDL: 32 mg/dL — ABNORMAL LOW (ref >40)
LDL Cholesterol: 109 mg/dL — ABNORMAL HIGH (ref <100)
Total CHOL/HDL Ratio: 5.6 Ratio — ABNORMAL HIGH (ref <5.0)
Triglycerides: 192 mg/dL — ABNORMAL HIGH (ref <150)
VLDL: 38 mg/dL — ABNORMAL HIGH (ref <30)

## 2016-07-28 LAB — TSH: TSH: 1.32 mIU/L (ref 0.40–4.50)

## 2016-07-28 NOTE — Patient Instructions (Signed)

## 2016-07-29 LAB — MAGNESIUM: Magnesium: 2.2 mg/dL (ref 1.5–2.5)

## 2016-07-29 LAB — TESTOSTERONE: Testosterone: 962 ng/dL — ABNORMAL HIGH (ref 250–827)

## 2016-07-29 LAB — INSULIN, RANDOM: Insulin: 7 u[IU]/mL (ref 2.0–19.6)

## 2016-07-29 LAB — HEMOGLOBIN A1C
Hgb A1c MFr Bld: 5.6 % (ref <5.7)
Mean Plasma Glucose: 114 mg/dL

## 2016-07-29 LAB — VITAMIN D 25 HYDROXY (VIT D DEFICIENCY, FRACTURES): Vit D, 25-Hydroxy: 42 ng/mL (ref 30–100)

## 2016-08-15 ENCOUNTER — Other Ambulatory Visit: Payer: Self-pay | Admitting: *Deleted

## 2016-08-15 DIAGNOSIS — I10 Essential (primary) hypertension: Secondary | ICD-10-CM

## 2016-08-15 MED ORDER — VERAPAMIL HCL ER 240 MG PO TBCR: 240.0000 mg | EXTENDED_RELEASE_TABLET | Freq: Every day | ORAL | 1 refills | Status: DC

## 2016-10-11 ENCOUNTER — Other Ambulatory Visit: Payer: Self-pay | Admitting: *Deleted

## 2016-10-11 DIAGNOSIS — E782 Mixed hyperlipidemia: Secondary | ICD-10-CM

## 2016-10-11 MED ORDER — MINOXIDIL 10 MG PO TABS: ORAL_TABLET | ORAL | 1 refills | Status: DC

## 2016-10-11 MED ORDER — PRAVASTATIN SODIUM 20 MG PO TABS: 20.0000 mg | ORAL_TABLET | Freq: Every day | ORAL | 1 refills | Status: DC

## 2016-10-24 ENCOUNTER — Other Ambulatory Visit: Payer: Self-pay | Admitting: Internal Medicine

## 2016-10-24 DIAGNOSIS — I10 Essential (primary) hypertension: Secondary | ICD-10-CM

## 2016-11-01 NOTE — Progress Notes (Signed)
Assessment and Plan:  Hypertension -Continue medication, monitor blood pressure at home. Continue DASH diet.  Reminder to go to the ER if any CP, SOB, nausea, dizziness, severe HA, changes vision/speech, left arm numbness and tingling and jaw pain.   Cholesterol -Continue diet and exercise. Check cholesterol.    Prediabetes  -Continue diet and exercise. Check A1C   Vitamin D Def - check level and continue medications.   Morbid Obesity with co morbidities-  long discussion about weight loss, diet, and exercise  Hypogonadism-  continue replacement therapy, check testosterone levels as needed.    P Afib Continue ASA, CHADSVASC is 1, no flutter/fib at this this time continue verapamil and atenolol  Abdominal aortic aneurysm (AAA) without rupture (HCC) Control blood pressure, cholesterol, glucose, increase exercise.   Tendonitis, Achilles, right Stretches given, wear better shoes, stop running for a while, weight loss, if not better will refer to ortho May need to cut back on testosterone shots  Continue diet and meds as discussed. Further disposition pending results of labs. Over 30 minutes of exam, counseling, chart review, and critical decision making was performed Future Appointments Date Time Provider [place]  02/16/2017 10:00 AM Unk Pinto, MD [facility] None    HPI 51 y.o. male  presents for 3 month follow up on hypertension, cholesterol, prediabetes, and vitamin D deficiency.   His blood pressure has been controlled at home, today their BP is BP: 130/80  He does workout, runs 2-3 x a week. He denies chest pain, shortness of breath, dizziness. Has been having right heel pain on and off x 6-7 months, will ice it, take naproxyn. Not doing any stretches.  Has history of pAfib associated with ETOH, on bASA and verapamil /atenolol, denies any other symptoms, CHADSVASC 1.   He is on cholesterol medication and denies myalgias. His cholesterol is at goal. The  cholesterol last visit was:   Lab Results  Component Value Date   CHOL 179 07/28/2016   HDL 32 (L) 07/28/2016   LDLCALC 109 (H) 07/28/2016   TRIG 192 (H) 07/28/2016   CHOLHDL 5.6 (H) 07/28/2016    He has been working on diet and exercise for prediabetes with unsulin resistance, he has not started the metformin, and denies paresthesia of the feet, polydipsia, polyuria and visual disturbances. Last A1C in the office was:  Lab Results  Component Value Date   HGBA1C 5.6 07/28/2016   Patient is on Vitamin D supplement.   Lab Results  Component Value Date   VD25OH 42 07/28/2016    He has a history of testosterone deficiency and is on testosterone replacement, does shot every 2 weeks but has not had for 4 weeks. He states that the testosterone helps with his energy, libido, muscle mass. Lab Results  Component Value Date   TESTOSTERONE 962 (H) 07/28/2016   BMI is Body mass index is 36.76 kg/m., he is working on diet and exercise. Wt Readings from Last 3 Encounters:  11/02/16 263 lb 9.6 oz (119.6 kg)  07/28/16 268 lb 3.2 oz (121.7 kg)  07/08/16 272 lb 9.6 oz (123.7 kg)     Current Medications:  Current Outpatient Prescriptions on File Prior to Visit  Medication Sig Dispense Refill  . aspirin 81 MG tablet Take 81 mg by mouth daily.    Marland Kitchen atenolol (TENORMIN) 100 MG tablet Take 1 tablet (100 mg total) by mouth daily. Takes 1 whole tab daily 90 tablet 1  . Cholecalciferol (VITAMIN D3) 5000 units TABS Take 1 tablet (  5,000 Units total) by mouth daily. 90 tablet 3  . enalapril (VASOTEC) 20 MG tablet Take 1 tablet (20 mg total) by mouth daily. 90 tablet 1  . finasteride (PROSCAR) 5 MG tablet Take 1 tablet daily for Prostate 90 tablet 3  . hydrochlorothiazide (HYDRODIURIL) 25 MG tablet Take 1 tablet (25 mg total) by mouth daily. 90 tablet 3  . minoxidil (LONITEN) 10 MG tablet TAKE 1/2-1 TABLET BY MOUTH EVERY DAY FOR BLOOD PRESSURE 90 tablet 1  . pravastatin (PRAVACHOL) 20 MG tablet Take 1  tablet (20 mg total) by mouth daily. 90 tablet 1  . testosterone cypionate (DEPOTESTOSTERONE CYPIONATE) 200 MG/ML injection 2cc q 2 weeks and needs 3 cc syringes And 1" 21 G needles 10 mL 3  . valACYclovir (VALTREX) 1000 MG tablet 1 tablet 3 x daily for cld sores / fever b;listers 90 tablet 1  . verapamil (CALAN-SR) 240 MG CR tablet TAKE 1 TABLET BY MOUTH DAILY WITH FOOD FOR BLOOD PRESSURE 90 tablet 1   No current facility-administered medications on file prior to visit.    Medical History:  Past Medical History:  Diagnosis Date  . Depression   . Hyperlipidemia   . Hypertension   . Prostate enlargement    Allergies:  Allergies  Allergen Reactions  . Zocor [Simvastatin] Other (See Comments)    cpk elev  . Crestor [Rosuvastatin] Palpitations     Review of Systems:  Review of Systems  Constitutional: Negative.   HENT: Negative.   Eyes: Negative.   Respiratory: Negative.   Cardiovascular: Negative.   Gastrointestinal: Negative.   Genitourinary: Negative.   Musculoskeletal: Negative.   Skin: Negative.   Neurological: Negative.   Endo/Heme/Allergies: Negative.   Psychiatric/Behavioral: Negative.     Family history- Review and unchanged Social history- Review and unchanged Physical Exam: BP 130/80   Pulse 66   Temp 97.7 F (36.5 C)   Resp 14   Ht 5\' 11"  (1.803 m)   Wt 263 lb 9.6 oz (119.6 kg)   SpO2 94%   BMI 36.76 kg/m  Wt Readings from Last 3 Encounters:  11/02/16 263 lb 9.6 oz (119.6 kg)  07/28/16 268 lb 3.2 oz (121.7 kg)  07/08/16 272 lb 9.6 oz (123.7 kg)   General Appearance: Well nourished, in no apparent distress. Eyes: PERRLA, EOMs, conjunctiva no swelling or erythema Sinuses: No Frontal/maxillary tenderness ENT/Mouth: Ext aud canals clear, TMs without erythema, bulging. No erythema, swelling, or exudate on post pharynx.  Tonsils not swollen or erythematous. Hearing normal.  Neck: Supple, thyroid normal.  Respiratory: Respiratory effort normal, BS equal  bilaterally without rales, rhonchi, wheezing or stridor.  Cardio: RRR with no MRGs. Brisk peripheral pulses without edema.  Abdomen: Soft, + BS,  Non tender, no guarding, rebound, hernias, masses. Lymphatics: Non tender without lymphadenopathy.  Musculoskeletal: Full ROM, 5/5 strength, Normal gait Skin: Warm, dry without rashes, lesions, ecchymosis.  Neuro: Cranial nerves intact. Normal muscle tone, no cerebellar symptoms. Psych: Awake and oriented X 3, normal affect, Insight and Judgment appropriate.    Vicie Mutters, PA-C 11:08 AM [facility]

## 2016-11-02 ENCOUNTER — Encounter: Payer: Self-pay | Admitting: Physician Assistant

## 2016-11-02 ENCOUNTER — Ambulatory Visit (INDEPENDENT_AMBULATORY_CARE_PROVIDER_SITE_OTHER): Payer: Managed Care, Other (non HMO) | Admitting: Physician Assistant

## 2016-11-02 VITALS — BP 130/80 | HR 66 | Temp 97.7°F | Resp 14 | Ht 71.0 in | Wt 263.6 lb

## 2016-11-02 DIAGNOSIS — M7661 Achilles tendinitis, right leg: Secondary | ICD-10-CM | POA: Diagnosis not present

## 2016-11-02 DIAGNOSIS — E782 Mixed hyperlipidemia: Secondary | ICD-10-CM | POA: Diagnosis not present

## 2016-11-02 DIAGNOSIS — I714 Abdominal aortic aneurysm, without rupture, unspecified: Secondary | ICD-10-CM

## 2016-11-02 DIAGNOSIS — E291 Testicular hypofunction: Secondary | ICD-10-CM

## 2016-11-02 DIAGNOSIS — I480 Paroxysmal atrial fibrillation: Secondary | ICD-10-CM

## 2016-11-02 DIAGNOSIS — Z79899 Other long term (current) drug therapy: Secondary | ICD-10-CM | POA: Diagnosis not present

## 2016-11-02 DIAGNOSIS — I10 Essential (primary) hypertension: Secondary | ICD-10-CM

## 2016-11-02 LAB — HEPATIC FUNCTION PANEL
ALT: 23 U/L (ref 9–46)
AST: 19 U/L (ref 10–40)
Albumin: 4.3 g/dL (ref 3.6–5.1)
Alkaline Phosphatase: 39 U/L — ABNORMAL LOW (ref 40–115)
Bilirubin, Direct: 0.1 mg/dL (ref <=0.2)
Indirect Bilirubin: 0.6 mg/dL (ref 0.2–1.2)
Total Bilirubin: 0.7 mg/dL (ref 0.2–1.2)
Total Protein: 7 g/dL (ref 6.1–8.1)

## 2016-11-02 LAB — CBC WITH DIFFERENTIAL/PLATELET
Basophils Absolute: 64 cells/uL (ref 0–200)
Basophils Relative: 1 %
Eosinophils Absolute: 128 cells/uL (ref 15–500)
Eosinophils Relative: 2 %
HCT: 45 % (ref 38.5–50.0)
Hemoglobin: 15 g/dL (ref 13.2–17.1)
Lymphocytes Relative: 28 %
Lymphs Abs: 1792 cells/uL (ref 850–3900)
MCH: 27.1 pg (ref 27.0–33.0)
MCHC: 33.3 g/dL (ref 32.0–36.0)
MCV: 81.4 fL (ref 80.0–100.0)
MPV: 10.5 fL (ref 7.5–12.5)
Monocytes Absolute: 640 cells/uL (ref 200–950)
Monocytes Relative: 10 %
Neutro Abs: 3776 cells/uL (ref 1500–7800)
Neutrophils Relative %: 59 %
Platelets: 242 10*3/uL (ref 140–400)
RBC: 5.53 MIL/uL (ref 4.20–5.80)
RDW: 15 % (ref 11.0–15.0)
WBC: 6.4 10*3/uL (ref 3.8–10.8)

## 2016-11-02 LAB — LIPID PANEL
Cholesterol: 203 mg/dL — ABNORMAL HIGH (ref <200)
HDL: 34 mg/dL — ABNORMAL LOW (ref >40)
LDL Cholesterol: 134 mg/dL — ABNORMAL HIGH (ref <100)
Total CHOL/HDL Ratio: 6 Ratio — ABNORMAL HIGH (ref <5.0)
Triglycerides: 175 mg/dL — ABNORMAL HIGH (ref <150)
VLDL: 35 mg/dL — ABNORMAL HIGH (ref <30)

## 2016-11-02 LAB — BASIC METABOLIC PANEL WITH GFR
BUN: 17 mg/dL (ref 7–25)
CO2: 26 mmol/L (ref 20–31)
Calcium: 9.2 mg/dL (ref 8.6–10.3)
Chloride: 101 mmol/L (ref 98–110)
Creat: 1.03 mg/dL (ref 0.60–1.35)
GFR, Est African American: >89 mL/min (ref >=60)
GFR, Est Non African American: 85 mL/min (ref >=60)
Glucose, Bld: 94 mg/dL (ref 65–99)
Potassium: 4.1 mmol/L (ref 3.5–5.3)
Sodium: 138 mmol/L (ref 135–146)

## 2016-11-02 LAB — TSH: TSH: 1.47 mIU/L (ref 0.40–4.50)

## 2016-11-02 NOTE — Patient Instructions (Addendum)
Drink 80-100 oz a day of water, measure it out Eat 3 meals a day, have to do breakfast, eat protein- hard boiled eggs, protein bar like nature valley protein bar, greek yogurt like oikos triple zero, chobani 100, or light n fit greek  We want weight loss that will last so you should lose 1-2 pounds a week.  THAT IS IT! Please pick THREE things a month to change. Once it is a habit check off the item. Then pick another three items off the list to become habits.  If you are already doing a habit on the list GREAT!  Cross that item off! o Don't drink your calories. Ie, alcohol, soda, fruit juice, and sweet tea.  o Drink more water. Drink a glass when you feel hungry or before each meal.  o Eat breakfast - Complex carb and protein (likeDannon light and fit yogurt, oatmeal, fruit, eggs, turkey bacon). o Measure your cereal.  Eat no more than one cup a day. (ie Kashi) o Eat an apple a day. o Add a vegetable a day. o Try a new vegetable a month. o Use Pam! Stop using oil or butter to cook. o Don't finish your plate or use smaller plates. o Share your dessert. o Eat sugar free Jello for dessert or frozen grapes. o Don't eat 2-3 hours before bed. o Switch to whole wheat bread, pasta, and brown rice. o Make healthier choices when you eat out. No fries! o Pick baked chicken, NOT fried. o Don't forget to SLOW DOWN when you eat. It is not going anywhere.  o Take the stairs. o Park far away in the parking lot o Lift soup cans (or weights) for 10 minutes while watching TV. o Walk at work for 10 minutes during break. o Walk outside 1 time a week with your friend, kids, dog, or significant other. o Start a walking group at church. o Walk the mall as much as you can tolerate.  o Keep a food diary. o Weigh yourself daily. o Walk for 15 minutes 3 days per week. o Cook at home more often and eat out less.  If life happens and you go back to old habits, it is okay.  Just start over. You can do it!   If  you experience chest pain, get short of breath, or tired during the exercise, please stop immediately and inform your doctor.    Simple math prevails.    1st - exercise does not produce significant weight loss - at best one converts fat into muscle , "bulks up", loses inches, but usually stays "weight neutral"     2nd - think of your body weightas a check book: If you eat more calories than you burn up - you save money or gain weight .... Or if you spend more money than you put in the check book, ie burn up more calories than you eat, then you lose weight     3rd - if you walk or run 1 mile, you burn up 100 calories - you have to burn up 3,500 calories to lose 1 pound, ie you have to walk/run 35 miles to lose 1 measly pound. So if you want to lose 10 #, then you have to walk/run 350 miles, so.... clearly exercise is not the solution.     4. So if you consume 1,500 calories, then you have to burn up the equivalent of 15 miles to stay weight neutral - It also stands to reason   that if you consume 1,500 cal/day and don't lose weight, then you must be burning up about 1,500 cals/day to stay weight neutral.     5. If you really want to lose weight, you must cut your calorie intake 300 calories /day and at that rate you should lose about 1 # every 3 days.   6. Please purchase Dr Fara Olden Fuhrman's book(s) "The End of Dieting" & "Eat to Live" . It has some great concepts and recipes.       Achilles Tendinitis Achilles tendinitis is inflammation of the tough, cord-like band that attaches the lower leg muscles to the heel bone (Achilles tendon). This is usually caused by overusing the tendon and the ankle joint. Achilles tendinitis usually gets better over time with treatment and caring for yourself at home. It can take weeks or months to heal completely. What are the causes? This condition may be caused by:  A sudden increase in exercise or activity, such as running.  Doing the same exercises or  activities (such as jumping) over and over.  Not warming up calf muscles before exercising.  Exercising in shoes that are worn out or not made for exercise.  Having arthritis or a bone growth (spur) on the back of the heel bone. This can rub against the tendon and hurt it.  Age-related wear and tear. Tendons become less flexible with age and more likely to be injured.  What are the signs or symptoms? Common symptoms of this condition include:  Pain in the Achilles tendon or in the back of the leg, just above the heel. The pain usually gets worse with exercise.  Stiffness or soreness in the back of the leg, especially in the morning.  Swelling of the skin over the Achilles tendon.  Thickening of the tendon.  Bone spurs at the bottom of the Achilles tendon, near the heel.  Trouble standing on tiptoe.  How is this diagnosed? This condition is diagnosed based on your symptoms and a physical exam. You may have tests, including:  X-rays.  MRI.  How is this treated? The goal of treatment is to relieve symptoms and help your injury heal. Treatment may include:  Decreasing or stopping activities that caused the tendinitis. This may mean switching to low-impact exercises like biking or swimming.  Icing the injured area.  Doing physical therapy, including strengthening and stretching exercises.  NSAIDs to help relieve pain and swelling.  Using supportive shoes, wraps, heel lifts, or a walking boot (air cast).  Surgery. This may be done if your symptoms do not improve after 6 months.  Using high-energy shock wave impulses to stimulate the healing process (extracorporeal shock wave therapy). This is rare.  Injection of medicines to help relieve inflammation (corticosteroids). This is rare.  Follow these instructions at home: If you have an air cast:  Wear the cast as told by your health care provider. Remove it only as told by your health care provider.  Loosen the cast if  your toes tingle, become numb, or turn cold and blue. Activity  Gradually return to your normal activities once your health care provider approves. Do not do activities that cause pain. ? Consider doing low-impact exercises, like cycling or swimming.  If you have an air cast, ask your health care provider when it is safe for you to drive.  If physical therapy was prescribed, do exercises as told by your health care provider or physical therapist. Managing pain, stiffness, and swelling  Raise (elevate) your  foot above the level of your heart while you are sitting or lying down.  Move your toes often to avoid stiffness and to lessen swelling.  If directed, put ice on the injured area: ? Put ice in a plastic bag. ? Place a towel between your skin and the bag. ? Leave the ice on for 20 minutes, 2-3 times a day General instructions  If directed, wrap your foot with an elastic bandage or other wrap. This can help keep your tendon from moving too much while it heals. Your health care provider will show you how to wrap your foot correctly.  Wear supportive shoes or heel lifts only as told by your health care provider.  Take over-the-counter and prescription medicines only as told by your health care provider.  Keep all follow-up visits as told by your health care provider. This is important. Contact a health care provider if:  You have symptoms that gets worse.  You have pain that does not get better with medicine.  You develop new, unexplained symptoms.  You develop warmth and swelling in your foot.  You have a fever. Get help right away if:  You have a sudden popping sound or sensation in your Achilles tendon followed by severe pain.  You cannot move your toes or foot.  You cannot put any weight on your foot. Summary  Achilles tendinitis is inflammation of the tough, cord-like band that attaches the lower leg muscles to the heel bone (Achilles tendon).  This condition is  usually caused by overusing the tendon and the ankle joint. It can also be caused by arthritis or normal aging.  The most common symptoms of this condition include pain, swelling, or stiffness in the Achilles tendon or in the back of the leg.  This condition is usually treated with rest, NSAIDs, and physical therapy. This information is not intended to replace advice given to you by your health care provider. Make sure you discuss any questions you have with your health care provider. Document Released: 01/12/2005 Document Revised: 02/22/2016 Document Reviewed: 02/22/2016 Elsevier Interactive Patient Education  2017 [place]   Achilles Tendinitis Rehab Ask your health care provider which exercises are safe for you. Do exercises exactly as told by your health care provider and adjust them as directed. It is normal to feel mild stretching, pulling, tightness, or discomfort as you do these exercises, but you should stop right away if you feel sudden pain or your pain gets worse. Do not begin these exercises until told by your health care provider. Stretching and range of motion exercises These exercises warm up your muscles and joints and improve the movement and flexibility of your ankle. These exercises also help to relieve pain, numbness, and tingling. Exercise A: Standing wall calf stretch, knee straight  1. Stand with your hands against a wall. 2. Extend your __________ leg behind you and bend your front knee slightly. Keep both of your heels on the floor. 3. Point the toes of your back foot slightly inward. 4. Keeping your heels on the floor and your back knee straight, shift your weight toward the wall. Do not allow your back to arch. You should feel a gentle stretch in your calf. 5. Hold this position for seconds. Repeat __________ times. Complete this stretch __________ times per day. Exercise B: Standing wall calf stretch, knee bent 1. Stand with your hands against a  wall. 2. Extend your __________ leg behind you, and bend your front knee slightly. Keep both  of your heels on the floor. 3. Point the toes of your back foot slightly inward. 4. Keeping your heels on the floor, unlock your back knee so that it is bent. You should feel a gentle stretch deep in your calf. 5. Hold this position for __________ seconds. Repeat __________ times. Complete this stretch __________ times per day. Strengthening exercises These exercises build strength and control of your ankle. Endurance is the ability to use your muscles for a long time, even after they get tired. Exercise C: Plantar flexion with band  1. Sit on the floor with your __________ leg extended. You may put a pillow under your calf to give your foot more room to move. 2. Loop a rubber exercise band or tube around the ball of your __________ foot. The ball of your foot is on the walking surface, right under your toes. The band or tube should be slightly tense when your foot is relaxed. If the band or tube slips, you can put on your shoe or put a washcloth between the band and your foot to help it stay in place. 3. Slowly point your toes downward, pushing them away from you. 4. Hold this position for __________ seconds. 5. Slowly release the tension in the band or tube, controlling smoothly until your foot is back to the starting position. Repeat __________ times. Complete this exercise __________ times per day. Exercise D: Heel raise with eccentric lower  1. Stand on a step with the balls of your feet. The ball of your foot is on the walking surface, right under your toes. ? Do not put your heels on the step. ? For balance, rest your hands on the wall or on a railing. 2. Rise up onto the balls of your feet. 3. Keeping your heels up, shift all of your weight to your __________ leg and pick up your other leg. 4. Slowly lower your __________ leg so your heel drops below the level of the step. 5. Put down your  foot. If told by your health care provider, build up to:  3 sets of 15 repetitions while keeping your knees straight.  3 sets of 15 repetitions while keeping your knees bent as far as told by your health care provider.  Complete this exercise __________ times per day. If this exercise is too easy, try doing it while wearing a backpack with weights in it. Balance exercises These exercises improve or maintain your balance. Balance is important in preventing falls. Exercise E: Single leg stand 1. Without shoes, stand near a railing or in a door frame. Hold on to the railing or door frame as needed. 2. Stand on your __________ foot. Keep your big toe down on the floor and try to keep your arch lifted. 3. Hold this position for __________ seconds. Repeat __________ times. Complete this exercise __________ times per day. If this exercise is too easy, you can try it with your eyes closed or while standing on a pillow. This information is not intended to replace advice given to you by your health care provider. Make sure you discuss any questions you have with your health care provider. Document Released: 11/03/2004 Document Revised: 12/10/2015 Document Reviewed: 12/09/2014 Elsevier Interactive Patient Education  [employer].

## 2016-11-03 LAB — MAGNESIUM: Magnesium: 1.9 mg/dL (ref 1.5–2.5)

## 2016-11-03 LAB — TESTOSTERONE: Testosterone: 664 ng/dL (ref 250–827)

## 2016-12-14 ENCOUNTER — Other Ambulatory Visit: Payer: Self-pay | Admitting: Internal Medicine

## 2016-12-23 ENCOUNTER — Other Ambulatory Visit: Payer: Self-pay | Admitting: Internal Medicine

## 2016-12-23 DIAGNOSIS — M7660 Achilles tendinitis, unspecified leg: Secondary | ICD-10-CM

## 2017-01-17 ENCOUNTER — Encounter (INDEPENDENT_AMBULATORY_CARE_PROVIDER_SITE_OTHER): Payer: Self-pay | Admitting: Orthopedic Surgery

## 2017-01-17 ENCOUNTER — Ambulatory Visit (INDEPENDENT_AMBULATORY_CARE_PROVIDER_SITE_OTHER): Payer: Managed Care, Other (non HMO) | Admitting: Orthopedic Surgery

## 2017-01-17 VITALS — Ht 71.0 in | Wt 260.0 lb

## 2017-01-17 DIAGNOSIS — M7661 Achilles tendinitis, right leg: Secondary | ICD-10-CM | POA: Diagnosis not present

## 2017-01-17 DIAGNOSIS — M6701 Short Achilles tendon (acquired), right ankle: Secondary | ICD-10-CM | POA: Diagnosis not present

## 2017-01-17 MED ORDER — NITROGLYCERIN 0.2 MG/HR TD PT24: 0.2000 mg | MEDICATED_PATCH | Freq: Every day | TRANSDERMAL | 12 refills | Status: DC

## 2017-01-17 NOTE — Progress Notes (Deleted)
   Office Visit Note   Patient: Nicholas Crosby           Date of Birth: Aug 29, 1965           MRN: [id number] Visit Date: 01/17/2017              Requested by: Unk Pinto, [place] [address] [place], [place] [address] PCP: Unk Pinto, MD  Chief Complaint  Patient presents with  . Right Achilles Tendon - Pain      HPI: ***  Assessment & Plan: Visit Diagnoses:  1. Achilles tendinitis, right leg   2. Achilles tendon contracture, right     Plan: ***  Follow-Up Instructions: Return if symptoms worsen or fail to improve.   Ortho Exam  Patient is alert, oriented, no adenopathy, well-dressed, normal affect, normal respiratory effort. ***  Imaging: No results found. No images are attached to the encounter.  Labs: Lab Results  Component Value Date   HGBA1C 5.6 07/28/2016   HGBA1C 6.0 (H) 01/19/2016   HGBA1C 5.6 10/12/2015   REPTSTATUS 06/23/2016 FINAL 06/18/2016   CULT  06/18/2016    NO GROWTH 5 DAYS Performed at [facility], [place] [address] [place], [place] [address]    LABORGA NO GROWTH 02/24/2016    Orders:  No orders of the defined types were placed in this encounter.  No orders of the defined types were placed in this encounter.    Procedures: No procedures performed  Clinical Data: No additional findings.  ROS:  All other systems negative, except as noted in the HPI. Review of Systems  Objective: Vital Signs: Ht 5\' 11"  (1.803 m)   Wt 260 lb (117.9 kg)   BMI 36.26 kg/m   Specialty Comments:  No specialty comments available.  PMFS History: Patient Active Problem List   Diagnosis Date Noted  . Abdominal aortic aneurysm ([place]) 01/19/2016  . Paroxysmal A-fib ([place]) 10/12/2015  . BMI 38.0-38.9,adult 03/02/2015  . Medication management 12/03/2013  . Morbid obesity (BMI 37.55) 08/05/2013  . Prediabetes 04/29/2013  . Hypertension   . Hyperlipidemia   . Testosterone deficiency   . Vitamin D deficiency   .  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 Depression   . Hyperlipidemia   . Hypertension   . Prostate enlargement     Family History  Problem Relation Age of Onset  . Hypertension Mother   . Hyperlipidemia Mother   . Diabetes Mother   . Cancer Mother        breast  . Heart disease Father   . Hyperlipidemia Father   . Hypertension Father     Past Surgical History:  Procedure Laterality Date  . COSMETIC SURGERY     liposuction  . CYST REMOVAL NECK    . NO PAST SURGERIES    . VASECTOMY  04/06/2012   Procedure: VASECTOMY;  Surgeon: Alexis Frock, MD;  Location: [facility];  Service: Urology;  Laterality: Bilateral;   Social History   Occupational History  . Not on file.   Social History Main Topics  . Smoking status: Never Smoker  . Smokeless tobacco: Never Used  . Alcohol use 1.2 oz/week    2 Cans of beer per week     Comment: social  . Drug use: No  . Sexual activity: Not on file

## 2017-01-17 NOTE — Progress Notes (Signed)
Office Visit Note   Patient: Nicholas Crosby           Date of Birth: 03-15-1966           MRN: [id number] Visit Date: 01/17/2017              Requested by: Unk Pinto, [address] [place], [place] [address] PCP: Unk Pinto, MD  Chief Complaint  Patient presents with  . Right Achilles Tendon - Pain      HPI: Patient is a 51 year old gentleman who is undergone good conservative therapy for right Achilles tendinitis. He has tried stretching he has taken Aleve 2 twice a day and he states that this did help for the 5 days that he took it. 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  Assessment & Plan: Visit Diagnoses:  1. Achilles tendinitis, right leg   2. Achilles tendon contracture, right     Plan: recommended continue with the Aleve 2 by mouth twice a day for 2 weeks. Patient was given instructions and demonstrated heel cord stretching. We will have him use a nitroglycerin patch to help improve the local microcirculation. Follow-up as needed.  Follow-Up Instructions: Return if symptoms worsen or fail to improve.   Ortho Exam  Patient is alert, oriented, no adenopathy, well-dressed, normal affect, normal respiratory effort. Examination patient has a good dorsalis pedis pulse he has good ankle subtalar motion he has dorsiflexion only to neutral with his knee extended with heel cord tightness. There is no nodular swelling to the Achilles no palpable defects. He is tender to palpation at the insertion no significant Haglund's deformity. The Achilles tendon is intact.  Imaging: No results found. No images are attached to the encounter.  Labs: Lab Results  Component Value Date   HGBA1C 5.6 07/28/2016   HGBA1C 6.0 (H) 01/19/2016   HGBA1C 5.6 10/12/2015   REPTSTATUS 06/23/2016 FINAL 06/18/2016   CULT  06/18/2016    NO GROWTH 5 DAYS Performed at [facility], [place] [address], [place], [place]  [address]    LABORGA NO GROWTH 02/24/2016    Orders:  No orders of the defined types were placed in this encounter.  No orders of the defined types were placed in this encounter.    Procedures: No procedures performed  Clinical Data: No additional findings.  ROS:  All other systems negative, except as noted in the HPI. Review of Systems  Objective: Vital Signs: Ht 5\' 11"  (1.803 m)   Wt 260 lb (117.9 kg)   BMI 36.26 kg/m   Specialty Comments:  No specialty comments available.  PMFS History: Patient Active Problem List   Diagnosis Date Noted  . Abdominal aortic aneurysm ([place]) 01/19/2016  . Paroxysmal A-fib ([place]) 10/12/2015  . BMI 38.0-38.9,adult 03/02/2015  . Medication management 12/03/2013  . Morbid obesity (BMI 37.55) 08/05/2013  . Prediabetes 04/29/2013  . Hypertension   . Hyperlipidemia   . Testosterone deficiency   . Vitamin D deficiency   . 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 Depression   . Hyperlipidemia   . Hypertension   . Prostate enlargement     Family History  Problem Relation Age of Onset  . Hypertension Mother   . Hyperlipidemia Mother   . Diabetes Mother   . Cancer Mother        breast  . Heart disease Father   .  Hyperlipidemia Father   . Hypertension Father     Past Surgical History:  Procedure Laterality Date  . COSMETIC SURGERY     liposuction  . CYST REMOVAL NECK    . NO PAST SURGERIES    . VASECTOMY  04/06/2012   Procedure: VASECTOMY;  Surgeon: Alexis Frock, MD;  Location: [facility];  Service: Urology;  Laterality: Bilateral;   Social History   Occupational History  . Not on file.   Social History Main Topics  . Smoking status: Never Smoker  . Smokeless tobacco: Never Used  . Alcohol use 1.2 oz/week    2 Cans of beer per week     Comment: social  . Drug use: No  . Sexual activity: Not on file

## 2017-02-09 ENCOUNTER — Other Ambulatory Visit: Payer: Self-pay | Admitting: Internal Medicine

## 2017-02-09 ENCOUNTER — Other Ambulatory Visit: Payer: Self-pay | Admitting: Physician Assistant

## 2017-02-09 DIAGNOSIS — I10 Essential (primary) hypertension: Secondary | ICD-10-CM

## 2017-02-09 DIAGNOSIS — N320 Bladder-neck obstruction: Secondary | ICD-10-CM

## 2017-02-09 DIAGNOSIS — N401 Enlarged prostate with lower urinary tract symptoms: Secondary | ICD-10-CM

## 2017-02-13 ENCOUNTER — Other Ambulatory Visit: Payer: Self-pay | Admitting: *Deleted

## 2017-02-13 DIAGNOSIS — E291 Testicular hypofunction: Secondary | ICD-10-CM

## 2017-02-13 MED ORDER — TESTOSTERONE CYPIONATE 200 MG/ML IM SOLN: INTRAMUSCULAR | 2 refills | Status: DC

## 2017-02-16 ENCOUNTER — Encounter: Payer: Self-pay | Admitting: Internal Medicine

## 2017-02-16 ENCOUNTER — Ambulatory Visit (INDEPENDENT_AMBULATORY_CARE_PROVIDER_SITE_OTHER): Payer: Managed Care, Other (non HMO) | Admitting: Internal Medicine

## 2017-02-16 VITALS — BP 134/86 | HR 60 | Temp 97.3°F | Resp 18 | Ht 72.0 in | Wt 261.0 lb

## 2017-02-16 DIAGNOSIS — I10 Essential (primary) hypertension: Secondary | ICD-10-CM

## 2017-02-16 DIAGNOSIS — R7303 Prediabetes: Secondary | ICD-10-CM

## 2017-02-16 DIAGNOSIS — Z136 Encounter for screening for cardiovascular disorders: Secondary | ICD-10-CM

## 2017-02-16 DIAGNOSIS — Z125 Encounter for screening for malignant neoplasm of prostate: Secondary | ICD-10-CM

## 2017-02-16 DIAGNOSIS — E559 Vitamin D deficiency, unspecified: Secondary | ICD-10-CM | POA: Diagnosis not present

## 2017-02-16 DIAGNOSIS — Z1211 Encounter for screening for malignant neoplasm of colon: Secondary | ICD-10-CM

## 2017-02-16 DIAGNOSIS — Z1212 Encounter for screening for malignant neoplasm of rectum: Secondary | ICD-10-CM

## 2017-02-16 DIAGNOSIS — R5383 Other fatigue: Secondary | ICD-10-CM

## 2017-02-16 DIAGNOSIS — Z0000 Encounter for general adult medical examination without abnormal findings: Secondary | ICD-10-CM

## 2017-02-16 DIAGNOSIS — Z0001 Encounter for general adult medical examination with abnormal findings: Secondary | ICD-10-CM

## 2017-02-16 DIAGNOSIS — E349 Endocrine disorder, unspecified: Secondary | ICD-10-CM

## 2017-02-16 DIAGNOSIS — Z79899 Other long term (current) drug therapy: Secondary | ICD-10-CM

## 2017-02-16 DIAGNOSIS — I700 Atherosclerosis of aorta: Secondary | ICD-10-CM

## 2017-02-16 DIAGNOSIS — L570 Actinic keratosis: Secondary | ICD-10-CM

## 2017-02-16 DIAGNOSIS — I480 Paroxysmal atrial fibrillation: Secondary | ICD-10-CM

## 2017-02-16 DIAGNOSIS — E782 Mixed hyperlipidemia: Secondary | ICD-10-CM

## 2017-02-16 MED ORDER — FLUOROURACIL 5 % EX CREA: TOPICAL_CREAM | CUTANEOUS | 2 refills | Status: DC

## 2017-02-16 NOTE — Patient Instructions (Signed)

## 2017-02-16 NOTE — Progress Notes (Signed)
[facility]   Unk Pinto, M.D.     [name], P.A.-C Liane Comber, [place]                [address] [place], [place] [address] Telephone [phone] Telefax [phone] Annual  Screening/Preventative Visit  & Comprehensive Evaluation & Examination     This very nice 51 y.o. [name] presents for a Screening/Preventative Visit & comprehensive evaluation and management of multiple medical co-morbidities.  Patient has been followed for HTN, Prediabetes, Hyperlipidemia and Vitamin D Deficiency.     HTN predates since 89. Patient's BP has been controlled at home. He has had a negative Renal Aa U/S in the past. Today's BP is at goal - 134/86.He had 1 episode of pAfib (CHADsvasc 1) and opted for prophylaxis with bASA.  Patient denies any cardiac symptoms as chest pain, palpitations, shortness of breath, dizziness or ankle swelling. To evaluate labile elevated BP's in the past he had a neg Renal Aa U/S in Sept 2017.     Patient's hyperlipidemia is controlled with diet and medications. Patient denies myalgias or other medication SE's. Last lipids were not at goal: Lab Results  Component Value Date   CHOL 203 (H) 11/02/2016   HDL 34 (L) 11/02/2016   LDLCALC 134 (H) 11/02/2016   TRIG 175 (H) 11/02/2016   CHOLHDL 6.0 (H) 11/02/2016      Patient has prediabetes (A1c 6.4% /2012 and 5.8%/2014)  and patient denies reactive hypoglycemic symptoms, visual blurring, diabetic polys or paresthesias. Last A1c was at goal: Lab Results  Component Value Date   HGBA1C 5.6 07/28/2016       Patient has jhx/o Low T 191 in 2012 and 150 in 2014 and has been on replacement w/Depo-Testosterone.Finally, patient has history of Vitamin D Deficiency ("21" in 2008) and last vitamin D was not at goal (70-100): Lab Results  Component Value Date   VD25OH 42 07/28/2016   Current Outpatient Prescriptions on File Prior  to Visit  Medication Sig  . aspirin 81 MG tablet Take 81 mg by mouth daily.  Marland Kitchen atenolol (TENORMIN) 100 MG tablet TAKE 1 TABLET BY MOUTH DAILY (TAKE 1 WHOLE TABLET DAILY)  . Cholecalciferol (VITAMIN D3) 5000 units TABS Take 1 tablet (5,000 Units total) by mouth daily.  . enalapril (VASOTEC) 20 MG tablet Take 1 tablet (20 mg total) by mouth daily.  . finasteride (PROSCAR) 5 MG tablet TAKE 1 TABLET BY MOUTH DAILY FOR PROSTATE  . hydrochlorothiazide (HYDRODIURIL) 25 MG tablet TAKE 1 TABLET BY MOUTH DAILY  . minoxidil (LONITEN) 10 MG tablet TAKE 1/2-1 TABLET BY MOUTH EVERY DAY FOR BLOOD PRESSURE  . nitroGLYCERIN (NITRODUR - DOSED IN MG/24 HR) 0.2 mg/hr patch Place 1 patch (0.2 mg total) onto the skin daily.  . pravastatin (PRAVACHOL) 20 MG tablet Take 1 tablet (20 mg total) by mouth daily.  Marland Kitchen testosterone cypionate (DEPOTESTOSTERONE CYPIONATE) 200 MG/ML injection 2cc q 2 weeks and needs 3 cc syringes And 1" 21 G needles  . valACYclovir (VALTREX) 1000 MG tablet 1 tablet 3 x daily for cld sores / fever b;listers  . verapamil (CALAN-SR) 240 MG CR tablet TAKE 1 TABLET BY MOUTH DAILY WITH FOOD FOR BLOOD PRESSURE   No current facility-administered medications on file prior to visit.    Allergies  Allergen Reactions  . Zocor [Simvastatin] Other (See Comments)  cpk elev  . Crestor [Rosuvastatin] Palpitations   Past Medical History:  Diagnosis Date  . Depression   . Hyperlipidemia   . Hypertension   . Prostate enlargement    Health Maintenance  Topic Date Due  . COLONOSCOPY  11/11/2016  . HEMOGLOBIN A1C  01/27/2017  . FOOT EXAM  05/03/2017 (Originally 11/11/1975)  . OPHTHALMOLOGY EXAM  04/24/2018 (Originally 11/11/1975)  . PNEUMOCOCCAL POLYSACCHARIDE VACCINE (1) 04/25/2019 (Originally 11/11/1967)  . TETANUS/TDAP  05/17/2025  . INFLUENZA VACCINE  Completed  . HIV Screening  Completed   Immunization History  Administered Date(s) Administered  . Influenza-Unspecified 01/17/2015, 01/16/2017   . Tdap 01/09/2007, 05/18/2015   Past Surgical History:  Procedure Laterality Date  . COSMETIC SURGERY     liposuction  . CYST REMOVAL NECK    . NO PAST SURGERIES    . VASECTOMY  04/06/2012   Procedure: VASECTOMY;  Surgeon: Alexis Frock, MD;  Location: [facility];  Service: Urology;  Laterality: Bilateral;   Family History  Problem Relation Age of Onset  . Hypertension Mother   . Hyperlipidemia Mother   . Diabetes Mother   . Cancer Mother        breast  . Heart disease Father   . Hyperlipidemia Father   . Hypertension Father    Social History   Social History  . Marital status: Single    Spouse name: N/A  . Number of children: N/A  . Years of education: N/A   Occupational History  . Not on file.   Social History Main Topics  . Smoking status: Never Smoker  . Smokeless tobacco: Never Used  . Alcohol use 1.2 oz/week    2 Cans of beer per week     Comment: social  . Drug use: No  . Sexual activity: Not on file    ROS Constitutional: Denies fever, chills, weight loss/gain, headaches, insomnia,  night sweats or change in appetite. Does c/o fatigue. Eyes: Denies redness, blurred vision, diplopia, discharge, itchy or watery eyes.  ENT: Denies discharge, congestion, post nasal drip, epistaxis, sore throat, earache, hearing loss, dental pain, Tinnitus, Vertigo, Sinus pain or snoring.  Cardio: Denies chest pain, palpitations, irregular heartbeat, syncope, dyspnea, diaphoresis, orthopnea, PND, claudication or edema Respiratory: denies cough, dyspnea, DOE, pleurisy, hoarseness, laryngitis or wheezing.  Gastrointestinal: Denies dysphagia, heartburn, reflux, water brash, pain, cramps, nausea, vomiting, bloating, diarrhea, constipation, hematemesis, melena, hematochezia, jaundice or hemorrhoids Genitourinary: Denies dysuria, frequency, urgency, nocturia, hesitancy, discharge, hematuria or flank pain Musculoskeletal: Denies arthralgia, myalgia, stiffness, Jt.  Swelling, pain, limp or strain/sprain. Denies Falls. Skin: Denies puritis, rash, hives, warts, acne, eczema or change in skin lesion Neuro: No weakness, tremor, incoordination, spasms, paresthesia or pain Psychiatric: Denies confusion, memory loss or sensory loss. Denies Depression. Endocrine: Denies change in weight, skin, hair change, nocturia, and paresthesia, diabetic polys, visual blurring or hyper / hypo glycemic episodes.  Heme/Lymph: No excessive bleeding, bruising or enlarged lymph nodes.  Physical Exam  BP 134/86   Pulse 60   Temp (!) 97.3 F (36.3 C)   Resp 18   Ht 6' (1.829 m)   Wt 261 lb (118.4 kg)   BMI 35.40 kg/m   General Appearance: Well nourished and well groomed and in no apparent distress.  Eyes: PERRLA, EOMs, conjunctiva no swelling or erythema, normal fundi and vessels. Sinuses: No frontal/maxillary tenderness ENT/Mouth: EACs patent / TMs  nl. Nares clear without erythema, swelling, mucoid exudates. Oral hygiene is good. No erythema, swelling, or exudate. Tongue  normal, non-obstructing. Tonsils not swollen or erythematous. Hearing normal.  Neck: Supple, thyroid normal. No bruits, nodes or JVD. Respiratory: Respiratory effort normal.  BS equal and clear bilateral without rales, rhonci, wheezing or stridor. Cardio: Heart sounds are normal with regular rate and rhythm and no murmurs, rubs or gallops. Peripheral pulses are normal and equal bilaterally without edema. No aortic or femoral bruits. Chest: symmetric with normal excursions and percussion.  Abdomen: Soft, with Nl bowel sounds. Nontender, no guarding, rebound, hernias, masses, or organomegaly.  Lymphatics: Non tender without lymphadenopathy.  Genitourinary: No hernias.Testes nl. DRE - prostate nl for age - smooth & firm w/o nodules. Musculoskeletal: Full ROM all peripheral extremities, joint stability, 5/5 strength, and normal gait. Skin: Warm and dry without rashes, cyanosis, clubbing or  ecchymosis. Has a  couple f circumscribed scaly 1 cm areas of the vertex scalp. Neuro: Cranial nerves intact, reflexes equal bilaterally. Normal muscle tone, no cerebellar symptoms. Sensation intact.  Pysch: Alert and oriented X 3 with normal affect, insight and judgment appropriate.   Assessment and Plan  1. Annual Preventative/Screening Exam   2. Essential hypertension  - EKG 12-Lead - [place], RETROPERITNL ABD,  LTD - Urinalysis, Routine w reflex microscopic - Microalbumin / creatinine urine ratio - CBC with Differential/Platelet - BASIC METABOLIC PANEL WITH GFR - Magnesium - TSH  3. Hyperlipidemia, mixed  - EKG 12-Lead - [place], RETROPERITNL ABD,  LTD - Hepatic function panel - Lipid panel - TSH  4. Prediabetes  - EKG 12-Lead - [place], RETROPERITNL ABD,  LTD - Hemoglobin A1c - Insulin, random  5. Vitamin D deficiency  - VITAMIN D 25 Hydroxy  6. Testosterone deficiency  - Testosterone  7. Screening for colorectal cancer  - POC Hemoccult Bld/Stl   8. Prostate cancer screening  - PSA  9. Screening for ischemic heart disease  - EKG 12-Lead  10. Screening for AAA (aortic abdominal aneurysm)  - [place], RETROPERITNL ABD,  LTD  11. Abdominal aortic atherosclerosis ([place])   12. Fatigue, unspecified type  - Iron,Total/Total Iron Binding Cap - Vitamin B12 - Testosterone - CBC with Differential/Platelet  13. Medication management  - Urinalysis, Routine w reflex microscopic - Microalbumin / creatinine urine ratio - CBC with Differential/Platelet - BASIC METABOLIC PANEL WITH GFR - Hepatic function panel - Magnesium - Lipid panel - TSH - Hemoglobin A1c - Insulin, random - VITAMIN D 25 Hydroxy   14. Paroxysmal A-fib ([place])   15. Actinic keratosis  - Rx Efudex to scaly scalp lesions       Patient was counseled in prudent diet, weight control to achieve/maintain BMI less than 25, BP monitoring, regular exercise and medications as discussed.  Discussed med effects and SE's.  Routine screening labs and tests as requested with regular follow-up as recommended. Over 40 minutes of exam, counseling, chart review and high complex critical decision making was performed

## 2017-02-17 LAB — IRON, TOTAL/TOTAL IRON BINDING CAP
%SAT: 25 % (calc) (ref 15–60)
Iron: 78 ug/dL (ref 50–180)
TIBC: 314 mcg/dL (calc) (ref 250–425)

## 2017-02-17 LAB — HEPATIC FUNCTION PANEL
AG Ratio: 1.5 (calc) (ref 1.0–2.5)
ALT: 22 U/L (ref 9–46)
AST: 17 U/L (ref 10–35)
Albumin: 4.1 g/dL (ref 3.6–5.1)
Alkaline phosphatase (APISO): 32 U/L — ABNORMAL LOW (ref 40–115)
Bilirubin, Direct: 0.1 mg/dL (ref 0.0–0.2)
Globulin: 2.8 g/dL (calc) (ref 1.9–3.7)
Indirect Bilirubin: 0.3 mg/dL (calc) (ref 0.2–1.2)
Total Bilirubin: 0.4 mg/dL (ref 0.2–1.2)
Total Protein: 6.9 g/dL (ref 6.1–8.1)

## 2017-02-17 LAB — HEMOGLOBIN A1C
Hgb A1c MFr Bld: 5.5 % of total Hgb (ref <5.7)
Mean Plasma Glucose: 111 (calc)
eAG (mmol/L): 6.2 (calc)

## 2017-02-17 LAB — MICROALBUMIN / CREATININE URINE RATIO
Creatinine, Urine: 126 mg/dL (ref 20–320)
Microalb Creat Ratio: 4 mcg/mg creat (ref <30)
Microalb, Ur: 0.5 mg/dL

## 2017-02-17 LAB — URINALYSIS, ROUTINE W REFLEX MICROSCOPIC
Bilirubin Urine: NEGATIVE
Glucose, UA: NEGATIVE
Hgb urine dipstick: NEGATIVE
Ketones, ur: NEGATIVE
Leukocytes, UA: NEGATIVE
Nitrite: NEGATIVE
Protein, ur: NEGATIVE
Specific Gravity, Urine: 1.022 (ref 1.001–1.03)
pH: 5.5 (ref 5.0–8.0)

## 2017-02-17 LAB — VITAMIN D 25 HYDROXY (VIT D DEFICIENCY, FRACTURES): Vit D, 25-Hydroxy: 73 ng/mL (ref 30–100)

## 2017-02-17 LAB — LIPID PANEL
Cholesterol: 210 mg/dL — ABNORMAL HIGH (ref <200)
HDL: 42 mg/dL (ref >40)
LDL Cholesterol (Calc): 145 mg/dL (calc) — ABNORMAL HIGH
Non-HDL Cholesterol (Calc): 168 mg/dL (calc) — ABNORMAL HIGH (ref <130)
Total CHOL/HDL Ratio: 5 (calc) — ABNORMAL HIGH (ref <5.0)
Triglycerides: 110 mg/dL (ref <150)

## 2017-02-17 LAB — CBC WITH DIFFERENTIAL/PLATELET
Basophils Absolute: 50 cells/uL (ref 0–200)
Basophils Relative: 0.9 %
Eosinophils Absolute: 123 cells/uL (ref 15–500)
Eosinophils Relative: 2.2 %
HCT: 45.9 % (ref 38.5–50.0)
Hemoglobin: 15.4 g/dL (ref 13.2–17.1)
Lymphs Abs: 1775 cells/uL (ref 850–3900)
MCH: 27.5 pg (ref 27.0–33.0)
MCHC: 33.6 g/dL (ref 32.0–36.0)
MCV: 81.8 fL (ref 80.0–100.0)
MPV: 11.1 fL (ref 7.5–12.5)
Monocytes Relative: 9.7 %
Neutro Abs: 3108 cells/uL (ref 1500–7800)
Neutrophils Relative %: 55.5 %
Platelets: 255 10*3/uL (ref 140–400)
RBC: 5.61 10*6/uL (ref 4.20–5.80)
RDW: 13.5 % (ref 11.0–15.0)
Total Lymphocyte: 31.7 %
WBC mixed population: 543 cells/uL (ref 200–950)
WBC: 5.6 10*3/uL (ref 3.8–10.8)

## 2017-02-17 LAB — BASIC METABOLIC PANEL WITH GFR
BUN: 19 mg/dL (ref 7–25)
CO2: 31 mmol/L (ref 20–32)
Calcium: 9.2 mg/dL (ref 8.6–10.3)
Chloride: 102 mmol/L (ref 98–110)
Creat: 1.04 mg/dL (ref 0.70–1.33)
GFR, Est African American: 97 mL/min/{1.73_m2} (ref >=60)
GFR, Est Non African American: 83 mL/min/{1.73_m2} (ref >=60)
Glucose, Bld: 123 mg/dL — ABNORMAL HIGH (ref 65–99)
Potassium: 4 mmol/L (ref 3.5–5.3)
Sodium: 138 mmol/L (ref 135–146)

## 2017-02-17 LAB — INSULIN, RANDOM: Insulin: 11.3 u[IU]/mL (ref 2.0–19.6)

## 2017-02-17 LAB — TESTOSTERONE: Testosterone: 44 ng/dL — ABNORMAL LOW (ref 250–827)

## 2017-02-17 LAB — MAGNESIUM: Magnesium: 2.1 mg/dL (ref 1.5–2.5)

## 2017-02-17 LAB — TSH: TSH: 1.15 mIU/L (ref 0.40–4.50)

## 2017-02-17 LAB — PSA: PSA: 0.4 ng/mL (ref <=4.0)

## 2017-02-17 LAB — VITAMIN B12: Vitamin B-12: 347 pg/mL (ref 200–1100)

## 2017-02-27 NOTE — Telephone Encounter (Signed)
Patient left message that Insurance will cover the Cologuard, this is his preference. Faxed order to Cologuard per Dr Melford Aase.

## 2017-03-08 ENCOUNTER — Other Ambulatory Visit: Payer: Self-pay | Admitting: Internal Medicine

## 2017-03-24 ENCOUNTER — Other Ambulatory Visit: Payer: Self-pay | Admitting: Internal Medicine

## 2017-03-24 DIAGNOSIS — E782 Mixed hyperlipidemia: Secondary | ICD-10-CM

## 2017-03-25 LAB — COLOGUARD: Cologuard: NEGATIVE

## 2017-03-28 ENCOUNTER — Encounter: Payer: Self-pay | Admitting: *Deleted

## 2017-05-19 ENCOUNTER — Other Ambulatory Visit: Payer: Self-pay | Admitting: *Deleted

## 2017-05-19 ENCOUNTER — Other Ambulatory Visit: Payer: Self-pay | Admitting: Internal Medicine

## 2017-05-19 MED ORDER — MINOXIDIL 10 MG PO TABS: ORAL_TABLET | ORAL | 1 refills | Status: DC

## 2017-05-29 NOTE — Progress Notes (Signed)
FOLLOW UP  Assessment and Plan:   Paroxysmal Atrial Fibrillation CHADSVASC2 of 1 - has declined anticoagulation other than ASA and referral to cardiology Rate controlled - reportedly only seems to have with rare excessive ETOH consumption.   Hypertension Diastolic elevated today - hasn't been monitoring closely, discussed increasing enelapril to 40 mg vs minoxidil to full tab (currently taking 1/2 tab)  - patient would like to check BPs daily and present with log at [facility] prior to medication changes Monitor blood pressure at home; patient to call if consistently greater than 130/80 Continue DASH diet.   Reminder to go to the ER if any CP, SOB, nausea, dizziness, severe HA, changes vision/speech, left arm numbness and tingling and jaw pain.  Cholesterol Currently remains above goal; poorly tolerates higher intensity statins -  Discussed diet, add fiber supplement, avoid animal fat sources on current low carb diet Continue low cholesterol diet and exercise.  Check lipid panel.   Other abnormal glucose Recent A1Cs at goal Continue diet and exercise.  Perform daily foot/skin check, notify office of any concerning changes.  Check A1C  Obesity with co morbidities Long discussion about weight loss, diet, and exercise Recommended diet heavy in fruits and veggies and low in animal meats, cheeses, and dairy products, appropriate calorie intake Discussed ideal weight for height  Patient will work on increasing exercise, continue with low carb diet - discussed need for high fiber Will follow up in 3 months  Vitamin D Def At goal at last visit; continue supplementation to maintain goal of 70-100 Defer Vit D level  Hypogonadism - continue to monitor, states medication is helping with symptoms of low T.   Continue diet and meds as discussed. Further disposition pending results of labs. Discussed med's effects and SE's.   Over 30 minutes of exam, counseling, chart review, and critical decision  making was performed.   Future Appointments  Date Time Provider [place]  08/28/2017 10:30 AM Unk Pinto, MD [facility] None  03/28/2018 10:00 AM Unk Pinto, MD [facility] None    ----------------------------------------------------------------------------------------------------------------------  HPI 52 y.o. male  presents for 3 month follow up on hypertension, cholesterol, glucose management, obesity and vitamin D deficiency. He also has dx of paroxysmal a. Fib with CHADSVASC2 of 1 - risks have been discussed, he is on ASA, has declined anticoagulation after discussion of risks.   BMI is Body mass index is 35.4 kg/m., he has been working on diet and exercise. 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 He is watching diet for carbs and sugars. He has been doing some intermittent fasting.  Wt Readings from Last 3 Encounters:  05/30/17 261 lb (118.4 kg)  02/16/17 261 lb (118.4 kg)  01/17/17 260 lb (117.9 kg)   His blood pressure has not been controlled at home (reports a similar episode of elevated diastolic value), typically runs 1302-140s/70s-80s, today their BP is BP: (!) 134/92  He does workout. He denies chest pain, shortness of breath, dizziness.   He is on cholesterol medication (pravastatin 20 mg daily) and denies myalgias. His cholesterol is not at goal. The cholesterol last visit was:   Lab Results  Component Value Date   CHOL 210 (H) 02/16/2017   HDL 42 02/16/2017   LDLCALC 134 (H) 11/02/2016   TRIG 110 02/16/2017   CHOLHDL 5.0 (H) 02/16/2017    He has been working on diet and exercise for glucose management (recently prediabetic), and denies increased  appetite, nausea, paresthesia of the feet, polydipsia, polyuria, visual disturbances, vomiting and weight loss. Last A1C in the office was:  Lab Results  Component Value Date   HGBA1C 5.5 02/16/2017   Patient is on Vitamin D supplement and at goal at  recent check:    Lab Results  Component Value Date   VD25OH 73 02/16/2017     He has a history of testosterone deficiency and is on testosterone replacement. He states that the testosterone helps with his energy, libido, muscle mass. Lab Results  Component Value Date   TESTOSTERONE 44 (L) 02/16/2017     Current Medications:  Current Outpatient Medications on File Prior to Visit  Medication Sig  . aspirin 81 MG tablet Take 81 mg by mouth daily.  Marland Kitchen atenolol (TENORMIN) 100 MG tablet TAKE 1 TABLET BY MOUTH DAILY (TAKE 1 WHOLE TABLET DAILY)  . Cholecalciferol (VITAMIN D3) 5000 units CAPS TAKE 1 CAPSULE BY MOUTH DAILY  . enalapril (VASOTEC) 20 MG tablet Take 1 tablet (20 mg total) by mouth daily.  . finasteride (PROSCAR) 5 MG tablet TAKE 1 TABLET BY MOUTH DAILY FOR PROSTATE  . fluorouracil (EFUDEX) 5 % cream Apply to suspect areas 2 x / day til skin raw (Patient taking differently: as needed. Apply to suspect areas 2 x / day til skin raw)  . hydrochlorothiazide (HYDRODIURIL) 25 MG tablet TAKE 1 TABLET BY MOUTH DAILY  . minoxidil (LONITEN) 10 MG tablet Take 1/2 to 1 tablet every day for blood pressure.  . nitroGLYCERIN (NITRODUR - DOSED IN MG/24 HR) 0.2 mg/hr patch Place 1 patch (0.2 mg total) onto the skin daily.  . pravastatin (PRAVACHOL) 20 MG tablet TAKE 1 TABLET BY MOUTH DAILY  . testosterone cypionate (DEPOTESTOSTERONE CYPIONATE) 200 MG/ML injection 2cc q 2 weeks and needs 3 cc syringes And 1" 21 G needles  . valACYclovir (VALTREX) 1000 MG tablet Take 1 tablet daily to prevent Cold Sores /  Fever Blisters (Patient taking differently: as needed. Take 1 tablet daily to prevent Cold Sores /  Fever Blisters)  . verapamil (CALAN-SR) 240 MG CR tablet TAKE 1 TABLET BY MOUTH DAILY WITH FOOD FOR BLOOD PRESSURE   No current facility-administered medications on file prior to visit.      Allergies:  Allergies  Allergen Reactions  . Zocor [Simvastatin] Other (See Comments)    cpk elev  .  Crestor [Rosuvastatin] Palpitations     Medical History:  Past Medical History:  Diagnosis Date  . Depression   . Hyperlipidemia   . Hypertension   . Prostate enlargement    Family history- Reviewed and unchanged Social history- Reviewed and unchanged   Review of Systems:  Review of Systems  Constitutional: Negative for malaise/fatigue and weight loss.  HENT: Negative for hearing loss and tinnitus.   Eyes: Negative for blurred vision and double vision.  Respiratory: Negative for cough, shortness of breath and wheezing.   Cardiovascular: Negative for chest pain, palpitations, orthopnea, claudication and leg swelling.  Gastrointestinal: Negative for abdominal pain, blood in stool, constipation, diarrhea, heartburn, melena, nausea and vomiting.  Genitourinary: Negative.   Musculoskeletal: Negative for joint pain and myalgias.  Skin: Negative for rash.  Neurological: Negative for dizziness, tingling, sensory change, weakness and headaches.  Endo/Heme/Allergies: Negative for polydipsia.  Psychiatric/Behavioral: Negative.   All other systems reviewed and are negative.     Physical Exam: BP (!) 134/92   Pulse 63   Temp 97.9 F (36.6 C)   Ht 6' (1.829 m)  Wt 261 lb (118.4 kg)   SpO2 96%   BMI 35.40 kg/m  Wt Readings from Last 3 Encounters:  05/30/17 261 lb (118.4 kg)  02/16/17 261 lb (118.4 kg)  01/17/17 260 lb (117.9 kg)   General Appearance: Well nourished, in no apparent distress. Eyes: PERRLA, EOMs, conjunctiva no swelling or erythema Sinuses: No Frontal/maxillary tenderness ENT/Mouth: Ext aud canals clear, TMs without erythema, bulging. No erythema, swelling, or exudate on post pharynx.  Tonsils not swollen or erythematous. Hearing normal.  Neck: Supple, thyroid normal.  Respiratory: Respiratory effort normal, BS equal bilaterally without rales, rhonchi, wheezing or stridor.  Cardio: RRR with no MRGs. Brisk peripheral pulses without edema.  Abdomen: Soft, + BS.   Non tender, no guarding, rebound, hernias, masses. Lymphatics: Non tender without lymphadenopathy.  Musculoskeletal: Full ROM, 5/5 strength, Normal gait Skin: Warm, dry without rashes, lesions, ecchymosis.  Neuro: Cranial nerves intact. No cerebellar symptoms.  Psych: Awake and oriented X 3, normal affect, Insight and Judgment appropriate.    Izora Ribas, NP 10:02 AM [facility]

## 2017-05-30 ENCOUNTER — Ambulatory Visit: Payer: Managed Care, Other (non HMO) | Admitting: Adult Health

## 2017-05-30 ENCOUNTER — Encounter: Payer: Self-pay | Admitting: Adult Health

## 2017-05-30 VITALS — BP 134/92 | HR 63 | Temp 97.9°F | Ht 72.0 in | Wt 261.0 lb

## 2017-05-30 DIAGNOSIS — I10 Essential (primary) hypertension: Secondary | ICD-10-CM | POA: Diagnosis not present

## 2017-05-30 DIAGNOSIS — E559 Vitamin D deficiency, unspecified: Secondary | ICD-10-CM

## 2017-05-30 DIAGNOSIS — E349 Endocrine disorder, unspecified: Secondary | ICD-10-CM

## 2017-05-30 DIAGNOSIS — I480 Paroxysmal atrial fibrillation: Secondary | ICD-10-CM

## 2017-05-30 DIAGNOSIS — E785 Hyperlipidemia, unspecified: Secondary | ICD-10-CM | POA: Diagnosis not present

## 2017-05-30 DIAGNOSIS — Z79899 Other long term (current) drug therapy: Secondary | ICD-10-CM | POA: Diagnosis not present

## 2017-05-30 DIAGNOSIS — R7309 Other abnormal glucose: Secondary | ICD-10-CM

## 2017-05-30 MED ORDER — TESTOSTERONE CYPIONATE 200 MG/ML IM SOLN
400.0000 mg | Freq: Once | INTRAMUSCULAR | Status: DC
Start: 2017-05-30 — End: 2017-08-28

## 2017-05-30 NOTE — Patient Instructions (Addendum)
Remember -  Calories in < calories out = weight loss  Fat is very dense in calories and energy and relatively low in nutrient content - aim for healthy sources of fat, aim for high fiber - add fiber supplement such as benefiber or citrucel if needed  Check BPs daily, goal <130/80, bring log to [facility]  Getting too much fat and cholesterol in your diet may cause health problems. Following this diet helps keep your fat and cholesterol at normal levels. This can keep you from getting sick. What types of fat should I choose?  Choose monosaturated and polyunsaturated fats. These are found in foods such as olive oil, canola oil, flaxseeds, walnuts, almonds, and seeds.  Eat more omega-3 fats. Good choices include salmon, mackerel, sardines, tuna, flaxseed oil, and ground flaxseeds.  Limit saturated fats. These are in animal products such as meats, butter, and cream. They can also be in plant products such as palm oil, palm kernel oil, and coconut oil.  Avoid foods with partially hydrogenated oils in them. These contain trans fats. Examples of foods that have trans fats are stick margarine, some tub margarines, cookies, crackers, and other baked goods. What general guidelines do I need to follow?  Check food labels. Look for the words "trans fat" and "saturated fat."  When preparing a meal: ? Fill half of your plate with vegetables and green salads. ? Fill one fourth of your plate with whole grains. Look for the word "whole" as the first word in the ingredient list. ? Fill one fourth of your plate with lean protein foods.  Eat more foods that have fiber, like apples, carrots, beans, peas, and barley.  Eat more home-cooked foods. Eat less at restaurants and buffets.  Limit or avoid alcohol.  Limit foods high in starch and sugar.  Limit fried foods.  Cook foods without frying them. Baking, boiling, grilling, and broiling are all great options.  Lose weight if you are overweight. Losing even a  small amount of weight can help your overall health. It can also help prevent diseases such as diabetes and heart disease. What foods can I eat? Grains Whole grains, such as whole wheat or whole grain breads, crackers, cereals, and pasta. Unsweetened oatmeal, bulgur, barley, quinoa, or brown rice. Corn or whole wheat flour tortillas. Vegetables Fresh or frozen vegetables (raw, steamed, roasted, or grilled). Green salads. Fruits All fresh, canned (in natural juice), or frozen fruits. Meat and Other Protein Products Ground beef (85% or leaner), grass-fed beef, or beef trimmed of fat. Skinless chicken or [place]. Ground chicken or [place]. Pork trimmed of fat. All fish and seafood. Eggs. Dried beans, peas, or lentils. Unsalted nuts or seeds. Unsalted canned or dry beans.  Fats and Oils Tub margarines without trans fats. Light or reduced-fat mayonnaise and salad dressings. Avocado. Olive, canola, sesame, or safflower oils. Natural peanut or almond butter (choose ones without added sugar and oil). The items listed above may not be a complete list of recommended foods or beverages. Contact your dietitian for more options. What foods are not recommended? Grains White bread. White pasta. White rice. Cornbread. Bagels, pastries, and croissants. Crackers that contain trans fat. Vegetables White potatoes. Corn. Creamed or fried vegetables. Vegetables in a cheese sauce. Fruits Dried fruits. Canned fruit in light or heavy syrup. Fruit juice. Meat and Other Protein Products Fatty cuts of meat. Ribs, chicken wings, bacon, sausage, bologna, salami, chitterlings, fatback, hot dogs, bratwurst, and packaged luncheon meats. Liver and organ meats. Dairy Whole or  2% milk, cream, half-and-half, and cream cheese. Whole milk cheeses. Whole-fat or sweetened yogurt. Full-fat cheeses. Nondairy creamers and whipped toppings. Processed cheese, cheese spreads, or cheese curds. Sweets and Desserts Corn syrup, sugars, honey,  and molasses. Candy. Jam and jelly. Syrup. Sweetened cereals. Cookies, pies, cakes, donuts, muffins, and ice cream. Fats and Oils Butter, stick margarine, lard, shortening, ghee, or bacon fat. Coconut, palm kernel, or palm oils. Beverages Alcohol. Sweetened drinks (such as sodas, lemonade, and fruit drinks or punches). The items listed above may not be a complete list of foods and beverages to avoid. Contact your dietitian for more information. This information is not intended to replace advice given to you by your health care provider. Make sure you discuss any questions you have with your health care provider. Document Released: 10/04/2011 Document Revised: 12/10/2015 Document Reviewed: 07/04/2013 Elsevier Interactive Patient Education  [employer].

## 2017-05-31 LAB — HEPATIC FUNCTION PANEL
AG Ratio: 1.4 (calc) (ref 1.0–2.5)
ALT: 24 U/L (ref 9–46)
AST: 20 U/L (ref 10–35)
Albumin: 4.4 g/dL (ref 3.6–5.1)
Alkaline phosphatase (APISO): 37 U/L — ABNORMAL LOW (ref 40–115)
Bilirubin, Direct: 0.1 mg/dL (ref 0.0–0.2)
Globulin: 3.1 g/dL (calc) (ref 1.9–3.7)
Indirect Bilirubin: 0.6 mg/dL (calc) (ref 0.2–1.2)
Total Bilirubin: 0.7 mg/dL (ref 0.2–1.2)
Total Protein: 7.5 g/dL (ref 6.1–8.1)

## 2017-05-31 LAB — HEMOGLOBIN A1C
Hgb A1c MFr Bld: 5.7 % of total Hgb — ABNORMAL HIGH (ref <5.7)
Mean Plasma Glucose: 117 (calc)
eAG (mmol/L): 6.5 (calc)

## 2017-05-31 LAB — LIPID PANEL
Cholesterol: 192 mg/dL (ref <200)
HDL: 44 mg/dL (ref >40)
LDL Cholesterol (Calc): 129 mg/dL (calc) — ABNORMAL HIGH
Non-HDL Cholesterol (Calc): 148 mg/dL (calc) — ABNORMAL HIGH (ref <130)
Total CHOL/HDL Ratio: 4.4 (calc) (ref <5.0)
Triglycerides: 89 mg/dL (ref <150)

## 2017-05-31 LAB — CBC WITH DIFFERENTIAL/PLATELET
Basophils Absolute: 59 cells/uL (ref 0–200)
Basophils Relative: 1 %
Eosinophils Absolute: 83 cells/uL (ref 15–500)
Eosinophils Relative: 1.4 %
HCT: 46.7 % (ref 38.5–50.0)
Hemoglobin: 15.9 g/dL (ref 13.2–17.1)
Lymphs Abs: 1805 cells/uL (ref 850–3900)
MCH: 27.4 pg (ref 27.0–33.0)
MCHC: 34 g/dL (ref 32.0–36.0)
MCV: 80.4 fL (ref 80.0–100.0)
MPV: 11.3 fL (ref 7.5–12.5)
Monocytes Relative: 9.8 %
Neutro Abs: 3375 cells/uL (ref 1500–7800)
Neutrophils Relative %: 57.2 %
Platelets: 241 10*3/uL (ref 140–400)
RBC: 5.81 10*6/uL — ABNORMAL HIGH (ref 4.20–5.80)
RDW: 13.8 % (ref 11.0–15.0)
Total Lymphocyte: 30.6 %
WBC mixed population: 578 cells/uL (ref 200–950)
WBC: 5.9 10*3/uL (ref 3.8–10.8)

## 2017-05-31 LAB — BASIC METABOLIC PANEL WITH GFR
BUN: 16 mg/dL (ref 7–25)
CO2: 29 mmol/L (ref 20–32)
Calcium: 9.3 mg/dL (ref 8.6–10.3)
Chloride: 100 mmol/L (ref 98–110)
Creat: 1.01 mg/dL (ref 0.70–1.33)
GFR, Est African American: 100 mL/min/{1.73_m2} (ref >=60)
GFR, Est Non African American: 86 mL/min/{1.73_m2} (ref >=60)
Glucose, Bld: 107 mg/dL — ABNORMAL HIGH (ref 65–99)
Potassium: 4.4 mmol/L (ref 3.5–5.3)
Sodium: 137 mmol/L (ref 135–146)

## 2017-05-31 LAB — TSH: TSH: 1.09 mIU/L (ref 0.40–4.50)

## 2017-06-06 ENCOUNTER — Ambulatory Visit: Payer: Self-pay

## 2017-06-28 ENCOUNTER — Other Ambulatory Visit: Payer: Self-pay | Admitting: Internal Medicine

## 2017-06-28 DIAGNOSIS — I10 Essential (primary) hypertension: Secondary | ICD-10-CM

## 2017-08-28 ENCOUNTER — Ambulatory Visit: Payer: Managed Care, Other (non HMO) | Admitting: Internal Medicine

## 2017-08-28 ENCOUNTER — Encounter: Payer: Self-pay | Admitting: Internal Medicine

## 2017-08-28 VITALS — BP 138/86 | HR 64 | Temp 97.9°F | Resp 18 | Ht 72.0 in | Wt 277.6 lb

## 2017-08-28 DIAGNOSIS — R7309 Other abnormal glucose: Secondary | ICD-10-CM | POA: Diagnosis not present

## 2017-08-28 DIAGNOSIS — R350 Frequency of micturition: Secondary | ICD-10-CM

## 2017-08-28 DIAGNOSIS — E782 Mixed hyperlipidemia: Secondary | ICD-10-CM

## 2017-08-28 DIAGNOSIS — E349 Endocrine disorder, unspecified: Secondary | ICD-10-CM

## 2017-08-28 DIAGNOSIS — Z79899 Other long term (current) drug therapy: Secondary | ICD-10-CM | POA: Diagnosis not present

## 2017-08-28 DIAGNOSIS — E559 Vitamin D deficiency, unspecified: Secondary | ICD-10-CM | POA: Diagnosis not present

## 2017-08-28 DIAGNOSIS — R7303 Prediabetes: Secondary | ICD-10-CM

## 2017-08-28 DIAGNOSIS — N401 Enlarged prostate with lower urinary tract symptoms: Secondary | ICD-10-CM | POA: Diagnosis not present

## 2017-08-28 DIAGNOSIS — I10 Essential (primary) hypertension: Secondary | ICD-10-CM | POA: Diagnosis not present

## 2017-08-28 MED ORDER — TESTOSTERONE CYPIONATE 200 MG/ML IM SOLN: INTRAMUSCULAR | 2 refills | Status: DC

## 2017-08-28 MED ORDER — DOXAZOSIN MESYLATE 4 MG PO TABS: ORAL_TABLET | ORAL | 3 refills | Status: DC

## 2017-08-28 MED ORDER — TESTOSTERONE CYPIONATE 200 MG/ML IM SOLN
INTRAMUSCULAR | 2 refills | Status: DC
Start: 2017-08-28 — End: 2018-03-14

## 2017-08-28 NOTE — Patient Instructions (Signed)

## 2017-08-28 NOTE — Progress Notes (Signed)
This very nice 51 y.o. [name] presents for 6  month follow up with HTN, HLD, Pre-Diabetes and Vitamin D Deficiency. 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 He was reluctant to take Flomax after 1 week after reading possible SE's.     Patient is treated for HTN (1993) & BP has been controlled at home. Today's BP is at goal - 138/86. Patient has has a Negative Renal Aa U/S (2017).  Patient has hx/o of only 1 known episode of brief pAfib (CHADsvasc10 and he opted for LD bASA prophylaxis. Patient has had no complaints of any cardiac type chest pain, palpitations, dyspnea / orthopnea / PND, dizziness, claudication, or dependent edema.     Hyperlipidemia is not controlled with diet & meds. Patient denies myalgias or other med SE's. Last Lipids were not at goal: Lab Results  Component Value Date   CHOL 192 05/30/2017   HDL 44 05/30/2017   LDLCALC 129 (H) 05/30/2017   TRIG 89 05/30/2017   CHOLHDL 4.4 05/30/2017      Also, the patient has history of PreDiabetes (A1c6.4%/2012 and A1c 5.8% /2014)  and has had no symptoms of reactive hypoglycemia, diabetic polys, paresthesias or visual blurring.  Last A1c was almost to goal: Lab Results  Component Value Date   HGBA1C 5.7 (H) 05/30/2017      Patient has been on Testosterone Replacement by Depo-Testosterone for low levels of "191" in 2012 and "150" in 2014.      Further, the patient also has history of Vitamin D Deficiency ("21"/2008) and supplements vitamin D without any suspected side-effects. Last vitamin D was at goal: Lab Results  Component Value Date   VD25OH 73 02/16/2017   Current Outpatient Medications on File Prior to Visit  Medication Sig  . aspirin 81 MG tablet Take 81 mg by mouth daily.  Marland Kitchen atenolol (TENORMIN) 100 MG tablet TAKE 1 TABLET BY MOUTH DAILY  . Cholecalciferol (VITAMIN D3) 5000 units CAPS TAKE 1 CAPSULE BY MOUTH DAILY  . enalapril (VASOTEC) 20  MG tablet TAKE 1 TABLET BY MOUTH DAILY  . finasteride (PROSCAR) 5 MG tablet TAKE 1 TABLET BY MOUTH DAILY FOR PROSTATE  . fluorouracil (EFUDEX) 5 % cream Apply to suspect areas 2 x / day til skin raw (Patient taking differently: as needed. Apply to suspect areas 2 x / day til skin raw)  . hydrochlorothiazide (HYDRODIURIL) 25 MG tablet TAKE 1 TABLET BY MOUTH DAILY  . minoxidil (LONITEN) 10 MG tablet Take 1/2 to 1 tablet every day for blood pressure.  . nitroGLYCERIN (NITRODUR - DOSED IN MG/24 HR) 0.2 mg/hr patch Place 1 patch (0.2 mg total) onto the skin daily.  . pravastatin (PRAVACHOL) 20 MG tablet TAKE 1 TABLET BY MOUTH DAILY  . testosterone cypionate (DEPOTESTOSTERONE CYPIONATE) 200 MG/ML injection 2cc q 2 weeks and needs 3 cc syringes And 1" 21 G needles  . valACYclovir (VALTREX) 1000 MG tablet Take 1 tablet daily to prevent Cold Sores /  Fever Blisters (Patient taking differently: as needed. Take 1 tablet daily to prevent Cold Sores /  Fever Blisters)  . verapamil (CALAN-SR) 240 MG CR tablet TAKE 1 TABLET BY MOUTH DAILY WITH FOOD FOR BLOOD PRESSURE   Current Facility-Administered Medications on File Prior to Visit  Medication  . testosterone cypionate (DEPOTESTOSTERONE CYPIONATE) injection 400 mg   Allergies  Allergen Reactions  . Zocor [Simvastatin] Other (  See Comments)    cpk elev  . Crestor [Rosuvastatin] Palpitations   PMHx:   Past Medical History:  Diagnosis Date  . Depression   . Hyperlipidemia   . Hypertension   . Prostate enlargement    Immunization History  Administered Date(s) Administered  . Influenza-Unspecified 01/17/2015, 01/16/2017  . Tdap 01/09/2007, 05/18/2015   Past Surgical History:  Procedure Laterality Date  . COSMETIC SURGERY     liposuction  . CYST REMOVAL NECK    . NO PAST SURGERIES    . VASECTOMY  04/06/2012   Procedure: VASECTOMY;  Surgeon: Alexis Frock, MD;  Location: [facility];  Service: Urology;  Laterality: Bilateral;    FHx:    Reviewed / unchanged  SHx:    Reviewed / unchanged  Systems Review:  Constitutional: Denies fever, chills, wt changes, headaches, insomnia, fatigue, night sweats, change in appetite. Eyes: Denies redness, blurred vision, diplopia, discharge, itchy, watery eyes.  ENT: Denies discharge, congestion, post nasal drip, epistaxis, sore throat, earache, hearing loss, dental pain, tinnitus, vertigo, sinus pain, snoring.  CV: Denies chest pain, palpitations, irregular heartbeat, syncope, dyspnea, diaphoresis, orthopnea, PND, claudication or edema. Respiratory: denies cough, dyspnea, DOE, pleurisy, hoarseness, laryngitis, wheezing.  Gastrointestinal: Denies dysphagia, odynophagia, heartburn, reflux, water brash, abdominal pain or cramps, nausea, vomiting, bloating, diarrhea, constipation, hematemesis, melena, hematochezia  or hemorrhoids. Genitourinary: Denies dysuria, frequency, urgency, nocturia, hesitancy, discharge, hematuria or flank pain. Musculoskeletal: Denies arthralgias, myalgias, stiffness, jt. swelling, pain, limping or strain/sprain.  Skin: Denies pruritus, rash, hives, warts, acne, eczema or change in skin lesion(s). Neuro: No weakness, tremor, incoordination, spasms, paresthesia or pain. Psychiatric: Denies confusion, memory loss or sensory loss. Endo: Denies change in weight, skin or hair change.  Heme/Lymph: No excessive bleeding, bruising or enlarged lymph nodes.  Physical Exam  BP 138/86   Pulse 64   Temp 97.9 F (36.6 C)   Resp 18   Ht 6' (1.829 m)   Wt 277 lb 9.6 oz (125.9 kg)   BMI 37.65 kg/m   Appears  well nourished, well groomed  and in no distress.  Eyes: PERRLA, EOMs, conjunctiva no swelling or erythema. Sinuses: No frontal/maxillary tenderness ENT/Mouth: EAC's clear, TM's nl w/o erythema, bulging. Nares clear w/o erythema, swelling, exudates. Oropharynx clear without erythema or exudates. Oral hygiene is good. Tongue normal, non obstructing. Hearing  intact.  Neck: Supple. Thyroid not palpable. Car 2+/2+ without bruits, nodes or JVD. Chest: Respirations nl with BS clear & equal w/o rales, rhonchi, wheezing or stridor.  Cor: Heart sounds normal w/ regular rate and rhythm without sig. murmurs, gallops, clicks or rubs. Peripheral pulses normal and equal  without edema.  Abdomen: Soft & bowel sounds normal. Non-tender w/o guarding, rebound, hernias, masses or organomegaly.  Lymphatics: Unremarkable.  Musculoskeletal: Full ROM all peripheral extremities, joint stability, 5/5 strength and normal gait.  Skin: Warm, dry without exposed rashes, lesions or ecchymosis apparent.  Neuro: Cranial nerves intact, reflexes equal bilaterally. Sensory-motor testing grossly intact. Tendon reflexes grossly intact.  Pysch: Alert & oriented x 3.  Insight and judgement nl & appropriate. No ideations.  Assessment and Plan:  1. Essential hypertension  - Continue medication, monitor blood pressure at home.  - Continue DASH diet.  Reminder to go to the ER if any CP,  SOB, nausea, dizziness, severe HA, changes vision/speech.                               -  CBC with Differential/Platelet - COMPLETE METABOLIC PANEL WITH GFR - Magnesium - TSH - doxazosin (CARDURA) 4 MG tablet; Take 1 tablet at Bedtime for BP & Prostate  Dispense: 90 tablet; Refill: 3  2. Hyperlipidemia, mixed  - Continue diet/meds, exercise,& lifestyle modifications.  - Continue monitor periodic cholesterol/liver & renal functions   - Magnesium - Lipid panel - TSH  3. Abnormal glucose  - Continue diet, exercise, lifestyle modifications.  - Monitor appropriate labs.  - Hemoglobin A1c - Insulin, random  4. Vitamin D deficiency  - Continue supplementation.    - VITAMIN D 25 Hydroxy   5. Prediabetes  - Hemoglobin A1c - Insulin, random  6. Testosterone deficiency  - Testosterone - testosterone cypionate (DEPOTESTOSTERONE CYPIONATE) 200 MG/ML injection; 2cc q 2 weeks and needs  3 cc syringes And 1" 21 G needles  Dispense: 12 mL; Refill: 2  7. Medication management  - CBC with Differential/Platelet - COMPLETE METABOLIC PANEL WITH GFR - Magnesium - Lipid panel - TSH - Hemoglobin A1c - Insulin, random - VITAMIN D 25 Hydroxy  - Testosterone  8. Benign prostatic hyperplasia with urinary frequency  - doxazosin (CARDURA) 4 MG tablet; Take 1 tablet at Bedtime for BP & Prostate  Dispense: 90 tablet; Refill: 3         Discussed  regular exercise, BP monitoring, weight control to achieve/maintain BMI less than 25 and discussed med and SE's. Recommended labs to assess and monitor clinical status with further disposition pending results of labs. Over 30 minutes of exam, counseling, chart review was performed.

## 2017-08-29 LAB — CBC WITH DIFFERENTIAL/PLATELET
Basophils Absolute: 52 cells/uL (ref 0–200)
Basophils Relative: 0.8 %
Eosinophils Absolute: 78 cells/uL (ref 15–500)
Eosinophils Relative: 1.2 %
HCT: 44.5 % (ref 38.5–50.0)
Hemoglobin: 15 g/dL (ref 13.2–17.1)
Lymphs Abs: 1762 cells/uL (ref 850–3900)
MCH: 26.7 pg — ABNORMAL LOW (ref 27.0–33.0)
MCHC: 33.7 g/dL (ref 32.0–36.0)
MCV: 79.2 fL — ABNORMAL LOW (ref 80.0–100.0)
MPV: 10.8 fL (ref 7.5–12.5)
Monocytes Relative: 8 %
Neutro Abs: 4089 cells/uL (ref 1500–7800)
Neutrophils Relative %: 62.9 %
Platelets: 294 10*3/uL (ref 140–400)
RBC: 5.62 10*6/uL (ref 4.20–5.80)
RDW: 15.5 % — ABNORMAL HIGH (ref 11.0–15.0)
Total Lymphocyte: 27.1 %
WBC mixed population: 520 cells/uL (ref 200–950)
WBC: 6.5 10*3/uL (ref 3.8–10.8)

## 2017-08-29 LAB — COMPLETE METABOLIC PANEL WITH GFR
AG Ratio: 1.6 (calc) (ref 1.0–2.5)
ALT: 40 U/L (ref 9–46)
AST: 29 U/L (ref 10–35)
Albumin: 4.4 g/dL (ref 3.6–5.1)
Alkaline phosphatase (APISO): 45 U/L (ref 40–115)
BUN: 10 mg/dL (ref 7–25)
CO2: 32 mmol/L (ref 20–32)
Calcium: 9.8 mg/dL (ref 8.6–10.3)
Chloride: 98 mmol/L (ref 98–110)
Creat: 0.96 mg/dL (ref 0.70–1.33)
GFR, Est African American: 106 mL/min/{1.73_m2} (ref >=60)
GFR, Est Non African American: 92 mL/min/{1.73_m2} (ref >=60)
Globulin: 2.8 g/dL (calc) (ref 1.9–3.7)
Glucose, Bld: 101 mg/dL — ABNORMAL HIGH (ref 65–99)
Potassium: 4.1 mmol/L (ref 3.5–5.3)
Sodium: 138 mmol/L (ref 135–146)
Total Bilirubin: 0.9 mg/dL (ref 0.2–1.2)
Total Protein: 7.2 g/dL (ref 6.1–8.1)

## 2017-08-29 LAB — HEMOGLOBIN A1C
Hgb A1c MFr Bld: 5.8 % of total Hgb — ABNORMAL HIGH (ref <5.7)
Mean Plasma Glucose: 120 (calc)
eAG (mmol/L): 6.6 (calc)

## 2017-08-29 LAB — LIPID PANEL
Cholesterol: 177 mg/dL (ref <200)
HDL: 40 mg/dL — ABNORMAL LOW (ref >40)
LDL Cholesterol (Calc): 106 mg/dL (calc) — ABNORMAL HIGH
Non-HDL Cholesterol (Calc): 137 mg/dL (calc) — ABNORMAL HIGH (ref <130)
Total CHOL/HDL Ratio: 4.4 (calc) (ref <5.0)
Triglycerides: 185 mg/dL — ABNORMAL HIGH (ref <150)

## 2017-08-29 LAB — MAGNESIUM: Magnesium: 2 mg/dL (ref 1.5–2.5)

## 2017-08-29 LAB — INSULIN, RANDOM: Insulin: 8.4 u[IU]/mL (ref 2.0–19.6)

## 2017-08-29 LAB — TSH: TSH: 1.62 mIU/L (ref 0.40–4.50)

## 2017-08-29 LAB — TESTOSTERONE: Testosterone: 712 ng/dL (ref 250–827)

## 2017-08-29 LAB — VITAMIN D 25 HYDROXY (VIT D DEFICIENCY, FRACTURES): Vit D, 25-Hydroxy: 56 ng/mL (ref 30–100)

## 2017-09-14 ENCOUNTER — Other Ambulatory Visit: Payer: Self-pay | Admitting: Internal Medicine

## 2017-09-14 ENCOUNTER — Other Ambulatory Visit: Payer: Self-pay | Admitting: Physician Assistant

## 2017-09-14 DIAGNOSIS — N320 Bladder-neck obstruction: Secondary | ICD-10-CM

## 2017-09-14 DIAGNOSIS — N401 Enlarged prostate with lower urinary tract symptoms: Secondary | ICD-10-CM

## 2017-09-21 ENCOUNTER — Telehealth: Payer: Self-pay | Admitting: *Deleted

## 2017-09-21 ENCOUNTER — Other Ambulatory Visit: Payer: Self-pay | Admitting: Internal Medicine

## 2017-09-21 NOTE — Telephone Encounter (Signed)
Patient called and reported he took the Doxazosin 4 mg 1/2 tablet and experienced lethargy, trouble sleeping and heart felt funny. Per Dr Melford Aase, he suggested he take 1/2 tablet with the largest meal of the day and see if he can tolerate it. If so ,he can gradually increase the amount of medicine he takes. Patient is aware.

## 2017-11-06 ENCOUNTER — Other Ambulatory Visit: Payer: Self-pay | Admitting: Internal Medicine

## 2017-11-06 MED ORDER — SULFAMETHOXAZOLE-TRIMETHOPRIM 800-160 MG PO TABS: ORAL_TABLET | ORAL | 0 refills | Status: DC

## 2017-11-14 ENCOUNTER — Other Ambulatory Visit: Payer: Self-pay | Admitting: Internal Medicine

## 2017-12-04 NOTE — Progress Notes (Signed)
FOLLOW UP  Assessment and Plan:   Paroxysmal Atrial Fibrillation CHADSVASC2 of 1 - has declined anticoagulation other than ASA and referral to cardiology Rate controlled - reportedly only seems to have with rare excessive ETOH consumption.   Hypertension Controlled; continue medications Monitor blood pressure at home; patient to call if consistently greater than 130/80 Continue DASH diet.   Reminder to go to the ER if any CP, SOB, nausea, dizziness, severe HA, changes vision/speech, left arm numbness and tingling and jaw pain.  Cholesterol Currently remains mildly above goal; poorly tolerates higher intensity statins -  Discussed diet, add fiber supplement, avoid animal fat sources on current low carb diet Continue low cholesterol diet and exercise.  Check lipid panel.   Prediabetes Continue diet and exercise.  Perform daily foot/skin check, notify office of any concerning changes.  Check A1C  Obesity with co morbidities Long discussion about weight loss, diet, and exercise Recommended diet heavy in fruits and veggies and low in animal meats, cheeses, and dairy products, appropriate calorie intake Discussed ideal weight for height  Patient will work on increasing exercise, continue with low carb diet - discussed need for high fiber Will follow up in 3 months  Vitamin D Def At goal at last visit; continue supplementation to maintain goal of 70-100 Defer Vit D level  Hypogonadism - continue to monitor, states medication is helping with symptoms of low T.   Prostatism ? Prostatitis vs BPH; CT from 04/2016 showed normal size, on finasteride and doxazosin, still having discomfort and nocturia Will refill bactrim as he is already on for last 30 days and evaluate if improving symptoms; per patient preference would like to discuss with urology if not improving. Symptoms ongoing since last year.   Continue diet and meds as discussed. Further disposition pending results of labs.  Discussed med's effects and SE's.   Over 30 minutes of exam, counseling, chart review, and critical decision making was performed.   Future Appointments  Date Time Provider [place]  04/03/2018  3:45 PM Unk Pinto, MD [facility] None    ----------------------------------------------------------------------------------------------------------------------  HPI 52 y.o. male  presents for 3 month follow up on hypertension, cholesterol, prediabetes, obesity and vitamin D deficiency. He also has dx of paroxysmal a. Fib with CHADSVASC2 of 1 - risks have been discussed, he is on ASA, has declined anticoagulation after discussion of risks.   BMI is Body mass index is 37.3 kg/m., he has been working on diet and exercise. 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 He is watching diet for carbs and sugars. He has been doing some intermittent fasting.  Wt Readings from Last 3 Encounters:  12/05/17 275 lb (124.7 kg)  08/28/17 277 lb 9.6 oz (125.9 kg)  05/30/17 261 lb (118.4 kg)   His blood pressure has not been controlled at home, today their BP is BP: 130/82  He does workout. He denies chest pain, shortness of breath, dizziness.   He is on cholesterol medication (pravastatin 20 mg daily) and denies myalgias. His cholesterol is not at goal. The cholesterol last visit was:   Lab Results  Component Value Date   CHOL 177 08/28/2017   HDL 40 (L) 08/28/2017   LDLCALC 106 (H) 08/28/2017   TRIG 185 (H) 08/28/2017   CHOLHDL 4.4 08/28/2017    He has been working on diet and exercise for prediabetes, and denies increased appetite, nausea, paresthesia of the feet, polydipsia, polyuria, visual disturbances,  vomiting and weight loss. Last A1C in the office was:  Lab Results  Component Value Date   HGBA1C 5.8 (H) 08/28/2017   Patient is on Vitamin D supplement:    Lab Results  Component Value Date   VD25OH 9 08/28/2017     He has a  history of testosterone deficiency and is on testosterone replacement, due for shot today, doing every 2 weeks. He states that the testosterone helps with his energy, libido, muscle mass. Lab Results  Component Value Date   TESTOSTERONE 712 08/28/2017     Current Medications:  Current Outpatient Medications on File Prior to Visit  Medication Sig  . aspirin 81 MG tablet Take 81 mg by mouth daily.  Marland Kitchen atenolol (TENORMIN) 100 MG tablet TAKE 1 TABLET BY MOUTH DAILY  . Cholecalciferol (VITAMIN D3) 5000 units CAPS TAKE 1 CAPSULE BY MOUTH DAILY  . doxazosin (CARDURA) 4 MG tablet Take 1 tablet at Bedtime for BP & Prostate (Patient taking differently: Take 1/4 tablet at Bedtime for BP & Prostate)  . enalapril (VASOTEC) 20 MG tablet TAKE 1 TABLET BY MOUTH DAILY  . finasteride (PROSCAR) 5 MG tablet TAKE 1 TABLET BY MOUTH DAILY FOR PROSTATE  . fluorouracil (EFUDEX) 5 % cream Apply to suspect areas 2 x / day til skin raw (Patient taking differently: as needed. Apply to suspect areas 2 x / day til skin raw)  . hydrochlorothiazide (HYDRODIURIL) 25 MG tablet TAKE 1 TABLET BY MOUTH DAILY  . minoxidil (LONITEN) 10 MG tablet TAKE 1/2 TO 1 TABLET BY MOUTH EVERY DAY FOR BLOOD PRESSURE  . pravastatin (PRAVACHOL) 20 MG tablet TAKE 1 TABLET BY MOUTH DAILY  . sulfamethoxazole-trimethoprim (BACTRIM DS,SEPTRA DS) 800-160 MG tablet Take 1 tablet 2 x /day with food for prostate infection  . testosterone cypionate (DEPOTESTOSTERONE CYPIONATE) 200 MG/ML injection 2cc q 2 weeks and needs 3 cc syringes And 1" 21 G needles  . verapamil (CALAN-SR) 240 MG CR tablet TAKE 1 TABLET BY MOUTH DAILY WITH FOOD FOR BLOOD PRESSURE  . nitroGLYCERIN (NITRODUR - DOSED IN MG/24 HR) 0.2 mg/hr patch Place 1 patch (0.2 mg total) onto the skin daily. (Patient not taking: Reported on 12/05/2017)   No current facility-administered medications on file prior to visit.      Allergies:  Allergies  Allergen Reactions  . Zocor [Simvastatin] Other  (See Comments)    cpk elev  . Crestor [Rosuvastatin] Palpitations     Medical History:  Past Medical History:  Diagnosis Date  . Depression   . Hyperlipidemia   . Hypertension   . Prostate enlargement    Family history- Reviewed and unchanged Social history- Reviewed and unchanged   Review of Systems:  Review of Systems  Constitutional: Negative for malaise/fatigue and weight loss.  HENT: Negative for hearing loss and tinnitus.   Eyes: Negative for blurred vision and double vision.  Respiratory: Negative for cough, shortness of breath and wheezing.   Cardiovascular: Negative for chest pain, palpitations, orthopnea, claudication and leg swelling.  Gastrointestinal: Negative for abdominal pain, blood in stool, constipation, diarrhea, heartburn, melena, nausea and vomiting.  Genitourinary: Positive for frequency. Negative for dysuria, flank pain, hematuria and urgency.       Has some testicular discomfort prior to abx, reports nocturia 3-4 episodes despite finasteride  Musculoskeletal: Negative for joint pain and myalgias.  Skin: Negative for rash.  Neurological: Negative for dizziness, tingling, sensory change, weakness and headaches.  Endo/Heme/Allergies: Negative for polydipsia.  Psychiatric/Behavioral: Negative.   All other systems  reviewed and are negative.   Physical Exam: BP 130/82   Pulse 61   Temp (!) 97.5 F (36.4 C)   Ht 6' (1.829 m)   Wt 275 lb (124.7 kg)   SpO2 97%   BMI 37.30 kg/m  Wt Readings from Last 3 Encounters:  12/05/17 275 lb (124.7 kg)  08/28/17 277 lb 9.6 oz (125.9 kg)  05/30/17 261 lb (118.4 kg)   General Appearance: Well nourished, in no apparent distress. Eyes: PERRLA, EOMs, conjunctiva no swelling or erythema Sinuses: No Frontal/maxillary tenderness ENT/Mouth: Ext aud canals clear, TMs without erythema, bulging. No erythema, swelling, or exudate on post pharynx.  Tonsils not swollen or erythematous. Hearing normal.  Neck: Supple, thyroid  normal.  Respiratory: Respiratory effort normal, BS equal bilaterally without rales, rhonchi, wheezing or stridor.  Cardio: RRR with no MRGs. Brisk peripheral pulses without edema.  Abdomen: Soft, + BS.  Non tender, no guarding, rebound, hernias, masses. Lymphatics: Non tender without lymphadenopathy.  Musculoskeletal: Full ROM, 5/5 strength, Normal gait Skin: Warm, dry without rashes, lesions, ecchymosis.  Neuro: Cranial nerves intact. No cerebellar symptoms.  Psych: Awake and oriented X 3, normal affect, Insight and Judgment appropriate.    Nicholas Ribas, NP 4:04 PM [facility]

## 2017-12-05 ENCOUNTER — Encounter: Payer: Self-pay | Admitting: Adult Health

## 2017-12-05 ENCOUNTER — Ambulatory Visit: Payer: Managed Care, Other (non HMO) | Admitting: Adult Health

## 2017-12-05 VITALS — BP 130/82 | HR 61 | Temp 97.5°F | Ht 72.0 in | Wt 275.0 lb

## 2017-12-05 DIAGNOSIS — R7303 Prediabetes: Secondary | ICD-10-CM

## 2017-12-05 DIAGNOSIS — E349 Endocrine disorder, unspecified: Secondary | ICD-10-CM

## 2017-12-05 DIAGNOSIS — Z79899 Other long term (current) drug therapy: Secondary | ICD-10-CM | POA: Diagnosis not present

## 2017-12-05 DIAGNOSIS — I480 Paroxysmal atrial fibrillation: Secondary | ICD-10-CM

## 2017-12-05 DIAGNOSIS — E559 Vitamin D deficiency, unspecified: Secondary | ICD-10-CM

## 2017-12-05 DIAGNOSIS — I10 Essential (primary) hypertension: Secondary | ICD-10-CM

## 2017-12-05 DIAGNOSIS — E785 Hyperlipidemia, unspecified: Secondary | ICD-10-CM

## 2017-12-05 DIAGNOSIS — N400 Enlarged prostate without lower urinary tract symptoms: Secondary | ICD-10-CM

## 2017-12-05 DIAGNOSIS — N419 Inflammatory disease of prostate, unspecified: Secondary | ICD-10-CM

## 2017-12-05 MED ORDER — TESTOSTERONE CYPIONATE 200 MG/ML IM SOLN
200.0000 mg | Freq: Once | INTRAMUSCULAR | Status: AC
  Administered 2017-12-05: 200 mg via INTRAMUSCULAR

## 2017-12-05 MED ORDER — SULFAMETHOXAZOLE-TRIMETHOPRIM 800-160 MG PO TABS: ORAL_TABLET | ORAL | 0 refills | Status: DC

## 2017-12-05 NOTE — Addendum Note (Signed)
Addended by: Chancy Hurter on: 12/05/2017 04:37 PM   Modules accepted: Orders

## 2017-12-05 NOTE — Patient Instructions (Signed)
Set a goal weight, approx 1 lb/week recommended and monitor, setting goals in 10-15 lb increments  Aim for 7+ servings of fruits and vegetables daily  65-80+ fluid ounces of water or unsweet tea for healthy kidneys  Limit to max 1 drink of alcohol per day; avoid smoking/tobacco  Limit animal fats in diet for cholesterol and heart health - choose grass fed whenever available  Avoid highly processed foods, and foods high in saturated/trans fats  Aim for low stress - take time to unwind and care for your mental health  Aim for 150 min of moderate intensity exercise weekly for heart health, and weights twice weekly for bone health  Aim for 7-9 hours of sleep daily   Drink 1/2 your body weight in fluid ounces of water daily; drink a tall glass of water 30 min before meals  Don't eat until you're stuffed- listen to your stomach and eat until you are 80% full   Try eating off of a salad plate; wait 10 min after finishing before going back for seconds  Start by eating the vegetables on your plate; aim for 50% of your meals to be fruits or vegetables  Then eat your protein - lean meats (grass fed if possible), fish, beans, nuts in moderation  Eat your carbs/starch last ONLY if you still are hungry. If you can, stop before finishing it all  Avoid sugar and flour - the closer it looks to it's original form in nature, typically the better it is for you  Splurge in moderation - "assign" days when you get to splurge and have the "bad stuff" - I like to follow a 80% - 20% plan- "good" choices 80 % of the time, "bad" choices in moderation 20% of the time  Simple equation is: Calories out > calories in = weight loss - even if you eat the bad stuff, if you limit portions, you will still lose weight      When it comes to diets, agreement about the perfect plan isn't easy to find, even among the experts. Experts at the [employer] developed an idea known as the Healthy  Eating Plate. Just imagine a plate divided into logical, healthy portions.  The emphasis is on diet quality:  Load up on vegetables and fruits - one-half of your plate: Aim for color and variety, and remember that potatoes don't count.  Go for whole grains - one-quarter of your plate: Whole wheat, barley, wheat berries, quinoa, oats, brown rice, and foods made with them. If you want pasta, go with whole wheat pasta.  Protein power - one-quarter of your plate: Fish, chicken, beans, and nuts are all healthy, versatile protein sources. Limit red meat.  The diet, however, does go beyond the plate, offering a few other suggestions.  Use healthy plant oils, such as olive, canola, soy, corn, sunflower and peanut. Check the labels, and avoid partially hydrogenated oil, which have unhealthy trans fats.  If you're thirsty, drink water. Coffee and tea are good in moderation, but skip sugary drinks and limit milk and dairy products to one or two daily servings.  The type of carbohydrate in the diet is more important than the amount. Some sources of carbohydrates, such as vegetables, fruits, whole grains, and beans-are healthier than others.  Finally, stay active.

## 2017-12-06 LAB — CBC WITH DIFFERENTIAL/PLATELET
Basophils Absolute: 30 cells/uL (ref 0–200)
Basophils Relative: 0.4 %
Eosinophils Absolute: 113 cells/uL (ref 15–500)
Eosinophils Relative: 1.5 %
HCT: 43.4 % (ref 38.5–50.0)
Hemoglobin: 14.6 g/dL (ref 13.2–17.1)
Lymphs Abs: 1613 cells/uL (ref 850–3900)
MCH: 27.2 pg (ref 27.0–33.0)
MCHC: 33.6 g/dL (ref 32.0–36.0)
MCV: 80.8 fL (ref 80.0–100.0)
MPV: 11 fL (ref 7.5–12.5)
Monocytes Relative: 9.2 %
Neutro Abs: 5055 cells/uL (ref 1500–7800)
Neutrophils Relative %: 67.4 %
Platelets: 243 10*3/uL (ref 140–400)
RBC: 5.37 10*6/uL (ref 4.20–5.80)
RDW: 13.2 % (ref 11.0–15.0)
Total Lymphocyte: 21.5 %
WBC mixed population: 690 cells/uL (ref 200–950)
WBC: 7.5 10*3/uL (ref 3.8–10.8)

## 2017-12-06 LAB — COMPLETE METABOLIC PANEL WITH GFR
AG Ratio: 1.7 (calc) (ref 1.0–2.5)
ALT: 22 U/L (ref 9–46)
AST: 19 U/L (ref 10–35)
Albumin: 4.2 g/dL (ref 3.6–5.1)
Alkaline phosphatase (APISO): 36 U/L — ABNORMAL LOW (ref 40–115)
BUN: 17 mg/dL (ref 7–25)
CO2: 27 mmol/L (ref 20–32)
Calcium: 8.9 mg/dL (ref 8.6–10.3)
Chloride: 102 mmol/L (ref 98–110)
Creat: 1.29 mg/dL (ref 0.70–1.33)
GFR, Est African American: 74 mL/min/{1.73_m2} (ref >=60)
GFR, Est Non African American: 64 mL/min/{1.73_m2} (ref >=60)
Globulin: 2.5 g/dL (calc) (ref 1.9–3.7)
Glucose, Bld: 82 mg/dL (ref 65–99)
Potassium: 3.9 mmol/L (ref 3.5–5.3)
Sodium: 137 mmol/L (ref 135–146)
Total Bilirubin: 0.4 mg/dL (ref 0.2–1.2)
Total Protein: 6.7 g/dL (ref 6.1–8.1)

## 2017-12-06 LAB — HEMOGLOBIN A1C
Hgb A1c MFr Bld: 5.7 % of total Hgb — ABNORMAL HIGH (ref <5.7)
Mean Plasma Glucose: 117 (calc)
eAG (mmol/L): 6.5 (calc)

## 2017-12-06 LAB — LIPID PANEL
Cholesterol: 159 mg/dL (ref <200)
HDL: 35 mg/dL — ABNORMAL LOW (ref >40)
LDL Cholesterol (Calc): 96 mg/dL (calc)
Non-HDL Cholesterol (Calc): 124 mg/dL (calc) (ref <130)
Total CHOL/HDL Ratio: 4.5 (calc) (ref <5.0)
Triglycerides: 187 mg/dL — ABNORMAL HIGH (ref <150)

## 2017-12-06 LAB — MAGNESIUM: Magnesium: 2.1 mg/dL (ref 1.5–2.5)

## 2017-12-06 LAB — TSH: TSH: 0.94 mIU/L (ref 0.40–4.50)

## 2017-12-12 ENCOUNTER — Encounter: Payer: Self-pay | Admitting: Internal Medicine

## 2017-12-12 ENCOUNTER — Ambulatory Visit: Payer: Managed Care, Other (non HMO) | Admitting: Internal Medicine

## 2017-12-12 VITALS — BP 122/86 | HR 64 | Temp 97.3°F | Resp 18 | Ht 72.0 in | Wt 282.6 lb

## 2017-12-12 DIAGNOSIS — S39012A Strain of muscle, fascia and tendon of lower back, initial encounter: Secondary | ICD-10-CM | POA: Diagnosis not present

## 2017-12-12 MED ORDER — CYCLOBENZAPRINE HCL 10 MG PO TABS: ORAL_TABLET | ORAL | 0 refills | Status: DC

## 2017-12-12 MED ORDER — PREDNISONE 20 MG PO TABS: ORAL_TABLET | ORAL | 0 refills | Status: DC

## 2017-12-12 NOTE — Progress Notes (Signed)
  Subjective:    Patient ID: Nicholas Crosby, male    DOB: 1966-01-14, 52 y.o.   MRN: [id number]  HPI     Patient is a nice 52 yo [name] with 1 week hx/o Rt LBP w/o sciatica w/o injury. Medication Sig  . aspirin 81 MG tablet Take  daily.  Marland Kitchen atenolol 100 MG  TAKE 1 TABLETDAILY  . VITAMIN D 5000 units  TAKE 1 CAPSULE DAILY  . doxazosin  4 MG  Take 1/4 tablet at Bedtime for BP & Prostate)  . enalapril  20 MG  TAKE 1 TABLET DAILY  . Finasteride 5 MG  TAKE 1 TABLET DAILY FOR PROSTATE  . EFUDEX 5 % cream as needed to suspect areas 2 x / day til skin raw  . Hydrochlorothiazide 25 MG  TAKE 1 TABLET  DAILY  . minoxidil 10 MG  TAKE 1/2-1 TABLET  EVERY DAY  . pravastatin  20 MG tablet TAKE 1 TABLET DAILY  . testosterone cypio 200 MG/ML injec 2cc q 2 weeks   . verapamil-SR 240 MG CR  TAKE 1 TABLET DAILY    Allergies  Allergen Reactions  . Zocor [Simvastatin] Other (See Comments)    cpk elev  . Crestor [Rosuvastatin] Palpitations   Past Medical History:  Diagnosis Date  . Depression   . Hyperlipidemia   . Hypertension   . Prostate enlargement    Past Surgical History:  Procedure Laterality Date  . COSMETIC SURGERY     liposuction  . CYST REMOVAL NECK    . NO PAST SURGERIES    . VASECTOMY  04/06/2012   Procedure: VASECTOMY;  Surgeon: Alexis Frock, MD;  Location: [facility];  Service: Urology;  Laterality: Bilateral;   Review of Systems   10 point systems review negative except as above.    Objective:   Physical Exam  BP 122/86   Pulse 64   Temp (!) 97.3 F (36.3 C)   Resp 18   Ht 6' (1.829 m)   Wt 282 lb 9.6 oz (128.2 kg)   BMI 38.33 kg/m   HEENT - WNL. Neck - supple.  Chest - Clear equal BS. Cor - Nl HS. RRR w/o sig MGR. PP 1(+). No edema. MS- FROM w/o deformities. (+) tender Rt low para-lumbar spasm. Neg SLG & Fig -4 maneuver.   Gait Nl. Neuro -  Nl w/o focal abnormalities.    Assessment & Plan:    1. Lumbar strain, initial encounter  - predniSONE  (DELTASONE) 20 MG tablet; 1 tab 3 x day for 3 days, then 1 tab 2 x day for 3 days, then 1 tab 1 x day for 5 days  Dispense: 20 tablet; Refill: 0  - cyclobenzaprine (FLEXERIL) 10 MG tablet; Take 1/2 to 1 tablet 2 to 3 x /day as needed for muscle spasm  Dispense: 90 tablet; Refill: 0

## 2017-12-12 NOTE — Patient Instructions (Signed)

## 2017-12-14 ENCOUNTER — Encounter: Payer: Self-pay | Admitting: Internal Medicine

## 2017-12-14 ENCOUNTER — Ambulatory Visit: Payer: Managed Care, Other (non HMO) | Admitting: Internal Medicine

## 2017-12-14 VITALS — BP 136/84 | HR 70 | Temp 97.9°F | Ht 72.0 in | Wt 277.0 lb

## 2017-12-14 DIAGNOSIS — Z0000 Encounter for general adult medical examination without abnormal findings: Secondary | ICD-10-CM

## 2017-12-14 DIAGNOSIS — R011 Cardiac murmur, unspecified: Secondary | ICD-10-CM | POA: Diagnosis not present

## 2017-12-14 DIAGNOSIS — I10 Essential (primary) hypertension: Secondary | ICD-10-CM

## 2017-12-14 DIAGNOSIS — Z136 Encounter for screening for cardiovascular disorders: Secondary | ICD-10-CM | POA: Diagnosis not present

## 2017-12-14 NOTE — Progress Notes (Signed)
  Subjective:    Patient ID: Nicholas Crosby, male    DOB: 1966/03/01, 52 y.o.   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 Patient has a completely Neg CV systems review and also has had no difficulties with strenuous exercise and in fact had a negative Standard ETT during his evaluation. Also his EKG was and always have been normal.  He was advised to have clearance.   Outpatient Medications Prior to Visit  Medication Sig Dispense Refill  . aspirin 81 MG tablet Take 81 mg by mouth daily.    Marland Kitchen atenolol (TENORMIN) 100 MG tablet TAKE 1 TABLET BY MOUTH DAILY 90 tablet 3  . Cholecalciferol (VITAMIN D3) 5000 units CAPS TAKE 1 CAPSULE BY MOUTH DAILY 100 capsule 1  . cyclobenzaprine (FLEXERIL) 10 MG tablet Take 1/2 to 1 tablet 2 to 3 x /day as needed for muscle spasm 90 tablet 0  . doxazosin (CARDURA) 4 MG tablet Take 1 tablet at Bedtime for BP & Prostate (Patient taking differently: Take 1/4 tablet at Bedtime for BP & Prostate) 90 tablet 3  . enalapril (VASOTEC) 20 MG tablet TAKE 1 TABLET BY MOUTH DAILY 90 tablet 3  . finasteride (PROSCAR) 5 MG tablet TAKE 1 TABLET BY MOUTH DAILY FOR PROSTATE 90 tablet 3  . fluorouracil (EFUDEX) 5 % cream Apply to suspect areas 2 x / day til skin raw (Patient taking differently: as needed. Apply to suspect areas 2 x / day til skin raw) 40 g 2  . hydrochlorothiazide (HYDRODIURIL) 25 MG tablet TAKE 1 TABLET BY MOUTH DAILY 90 tablet 3  . minoxidil (LONITEN) 10 MG tablet TAKE 1/2 TO 1 TABLET BY MOUTH EVERY DAY FOR BLOOD PRESSURE 90 tablet 3  . pravastatin (PRAVACHOL) 20 MG tablet TAKE 1 TABLET BY MOUTH DAILY 90 tablet 3  . predniSONE (DELTASONE) 20 MG tablet 1 tab 3 x day for 3 days, then 1 tab 2 x day for 3 days, then 1 tab 1 x day for 5 days 20 tablet 0  . sulfamethoxazole-trimethoprim (BACTRIM DS,SEPTRA DS) 800-160 MG tablet Take 1 tablet 2 x /day with  food for prostate infection 60 tablet 0  . testosterone cypionate (DEPOTESTOSTERONE CYPIONATE) 200 MG/ML injection 2cc q 2 weeks and needs 3 cc syringes And 1" 21 G needles 12 mL 2  . verapamil (CALAN-SR) 240 MG CR tablet TAKE 1 TABLET BY MOUTH DAILY WITH FOOD FOR BLOOD PRESSURE 90 tablet 3   No facility-administered medications prior to visit.    Allergies  Allergen Reactions  . Zocor [Simvastatin] Other (See Comments)    cpk elev  . Crestor [Rosuvastatin] Palpitations   Review of Systems   10 point systems review negative except as above.    Objective:   Physical Exam  BP 136/84   Pulse 70   Temp 97.9 F (36.6 C)   Ht 6' (1.829 m)   Wt 277 lb (125.6 kg)   SpO2 95%   BMI 37.57 kg/m   HEENT - WNL. Neck - supple.  Chest - Clear equal BS. Cor - Nl HS. RRR w/o sig m. PP 2(+). No edema. MS- FROM w/o deformities.  Gait Nl. Neuro -  Nl w/o focal abnormalities.   Assessment & Plan:   1. Essential hypertension, controlled  2. Heart murmur, probably factious  - ECHOCARDIOGRAM LIMITED; Future

## 2017-12-18 ENCOUNTER — Encounter: Payer: Self-pay | Admitting: Internal Medicine

## 2017-12-25 ENCOUNTER — Ambulatory Visit (HOSPITAL_COMMUNITY): Payer: Managed Care, Other (non HMO) | Attending: Cardiology

## 2017-12-25 ENCOUNTER — Other Ambulatory Visit: Payer: Self-pay

## 2017-12-25 DIAGNOSIS — E785 Hyperlipidemia, unspecified: Secondary | ICD-10-CM | POA: Insufficient documentation

## 2017-12-25 DIAGNOSIS — Z6837 Body mass index (BMI) 37.0-37.9, adult: Secondary | ICD-10-CM | POA: Diagnosis not present

## 2017-12-25 DIAGNOSIS — R011 Cardiac murmur, unspecified: Secondary | ICD-10-CM | POA: Insufficient documentation

## 2017-12-25 DIAGNOSIS — I714 Abdominal aortic aneurysm, without rupture: Secondary | ICD-10-CM | POA: Insufficient documentation

## 2017-12-25 DIAGNOSIS — I10 Essential (primary) hypertension: Secondary | ICD-10-CM | POA: Diagnosis not present

## 2017-12-25 DIAGNOSIS — I4891 Unspecified atrial fibrillation: Secondary | ICD-10-CM | POA: Insufficient documentation

## 2017-12-25 DIAGNOSIS — R7303 Prediabetes: Secondary | ICD-10-CM | POA: Diagnosis not present

## 2018-01-03 ENCOUNTER — Ambulatory Visit (INDEPENDENT_AMBULATORY_CARE_PROVIDER_SITE_OTHER): Payer: Managed Care, Other (non HMO) | Admitting: Internal Medicine

## 2018-01-03 ENCOUNTER — Encounter: Payer: Self-pay | Admitting: Internal Medicine

## 2018-01-03 VITALS — BP 122/88 | HR 56 | Temp 97.5°F | Resp 18 | Ht 72.0 in | Wt 269.0 lb

## 2018-01-03 DIAGNOSIS — D485 Neoplasm of uncertain behavior of skin: Secondary | ICD-10-CM | POA: Diagnosis not present

## 2018-01-03 DIAGNOSIS — I10 Essential (primary) hypertension: Secondary | ICD-10-CM

## 2018-01-03 DIAGNOSIS — N401 Enlarged prostate with lower urinary tract symptoms: Secondary | ICD-10-CM

## 2018-01-03 DIAGNOSIS — N138 Other obstructive and reflux uropathy: Secondary | ICD-10-CM

## 2018-01-03 NOTE — Progress Notes (Signed)
   Subjective:    Patient ID: Nicholas Crosby, male    DOB: 01-May-1965, 52 y.o.   MRN: [id number]  HPI    Patient is a 52 yo [name] who returns for f/u of prostatitis completing 3 of 4 weeks of Sxt-DS and still having some issues with LUTS. He is having sedation issues with Doxazosin hs despite the very low dose of 1 mg. He plans to switch back and retry Flomax 0.4 mg. Also, he has developed a raised filamentous skin lesion in his Rt groin.   Medication Sig  . aspirin 81 MG tablet Take  daily.  Marland Kitchen atenolol  100 MG tablet TAKE 1 TAB DAILY  . VITAMIN D 5000 units  TAKE 1 CAP DAILY  . cyclobenzaprine  10 MG tablet Take 1/2 to 1 tablet 2 to 3 x /day as needed  . doxazosin 4 MG tablet Take 1/4 tablet at Bedtime for BP & Prostate  . enalapril (VASOTEC) 20 MG tablet TAKE 1 TAB DAILY  . finasteride (PROSCAR) 5 MG tablet TAKE 1 TAB DAILY FOR PROSTATE  . fluorouracil (EFUDEX) 5 % cream  Apply to suspect areas 2 x / day til skin raw)  . hydrochlorothiazide  25 MG  TAKE 1 TAB DAILY  . minoxidil  10 MG tablet TAKE 1/2 TO 1 TAB EVERY DAY  . pravastatin  20 MG tablet TAKE 1 TAB DAILY  . SEPTRA DS 800-160 MG tablet Take 1 tablet 2 x /day with food for prostate infection  . testosterone cypio 200 MG/ML injec 2cc q 2 weeks   . verapamil-SR 240 MG  TAKE 1 TAB DAILY    Allergies  Allergen Reactions  . Zocor [Simvastatin] Other (See Comments)    cpk elev  . Crestor [Rosuvastatin] Palpitations   Past Medical History:  Diagnosis Date  . Depression   . Hyperlipidemia   . Hypertension   . Prostate enlargement    Review of Systems    10 point systems review negative except as above.    Objective:   Physical Exam  BP 122/88   Pulse (!) 56   Temp (!) 97.5 F (36.4 C)   Resp 18   Ht 6' (1.829 m)   Wt 269 lb (122 kg)   BMI 36.48 kg/m   HEENT - WNL. Neck - supple.  Chest - Clear equal BS. Cor - Nl HS. RRR w/o sig MGR. PP 1(+). No edema. MS- FROM w/o deformities.  Gait Nl. Neuro -  Nl w/o focal  abnormalities. Skin - There is a pink raised 10 mm x 14 mm filamentous lesion in the Rt groin inguinal line.   Procedure (CPT 23762)    After informed consent ans aseptic prep with alcohol & local anesthesia with 1 ml of Marcaine 0.5%, the lesion was sharply excise by shave technique with a #10 scalpel and the wound base was deeply hyfrecated for hemostasis & electrodestruction of any remnant lesion. Neopsorin sterile dsg was applied & patient was instructed in wound care.     Assessment & Plan:   1. Essential hypertension  2. BPH with obstruction/lower urinary tract symptoms  3. Neoplasm of uncertain behavior of skin

## 2018-01-04 IMAGING — CR DG CHEST 2V
2 series · 2 of 2 positions shown · non-contrast
Comparison: 05/18/2015.

CLINICAL DATA: Cough, nausea, vomiting, diarrhea and chills.

EXAM:
CHEST  2 VIEW

[w chest pa]
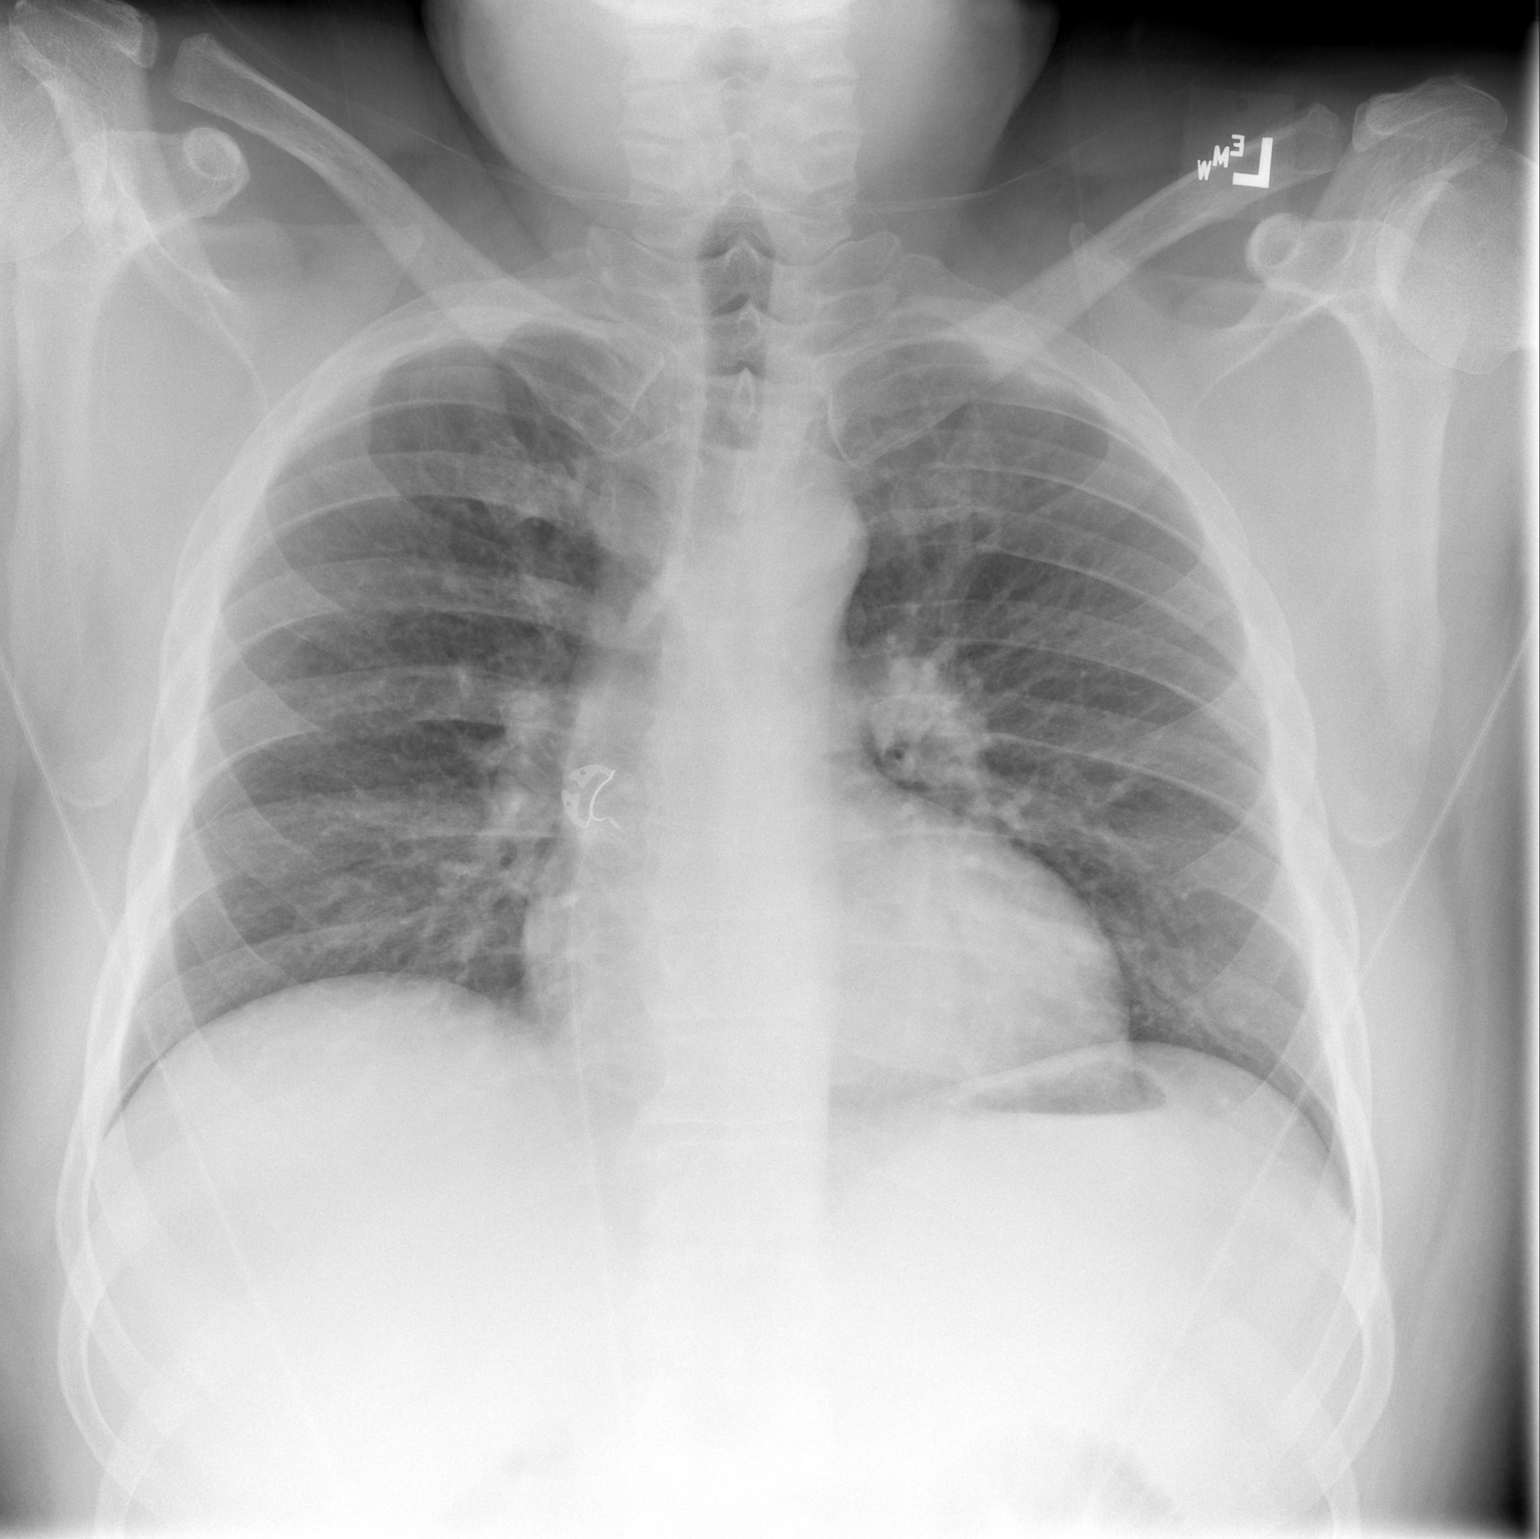

[w chest lat]
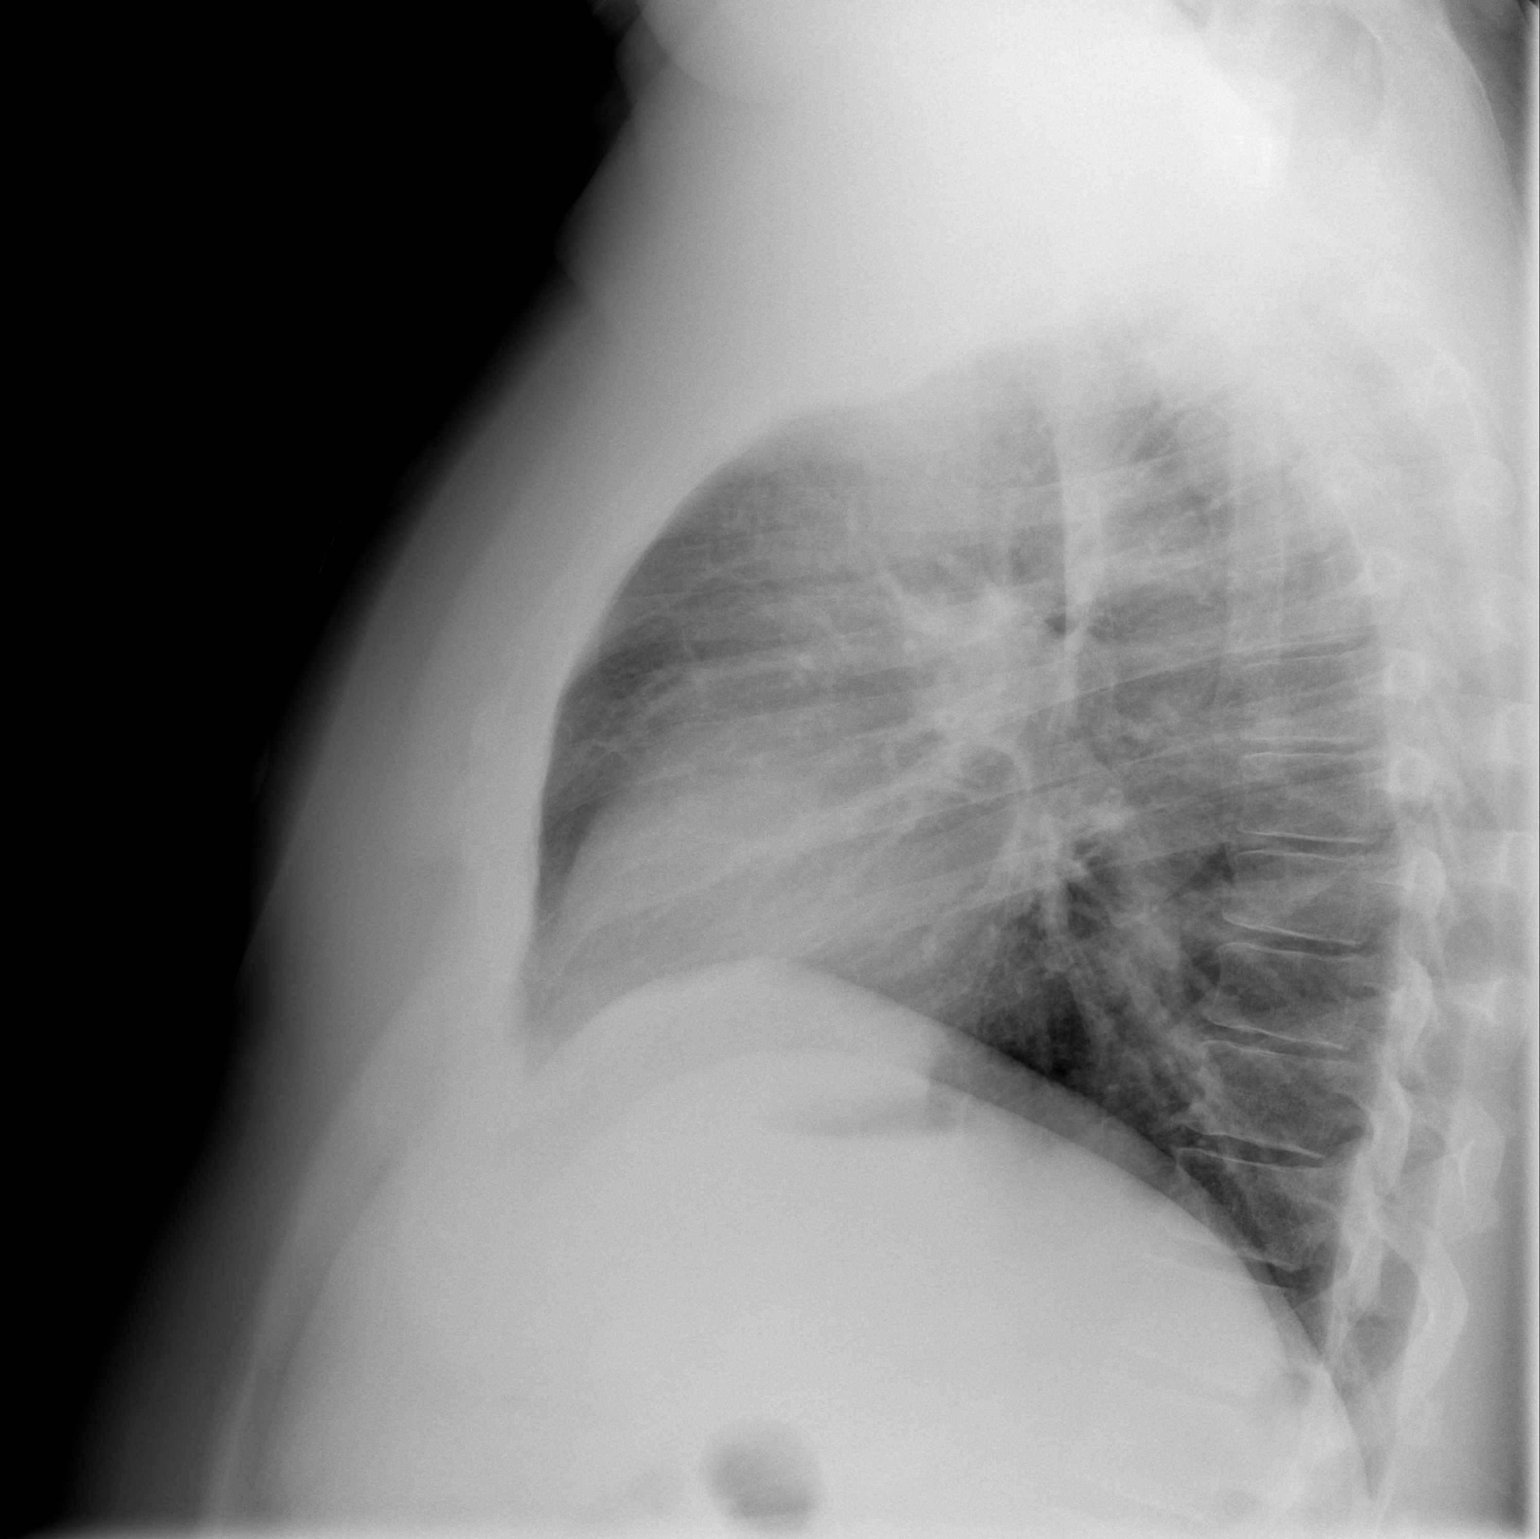

[2 of 2 positions shown; findings below may reference images not displayed]

FINDINGS: Normal sized heart. Clear lungs. Minimal thoracic spine degenerative
changes.
IMPRESSION: No acute abnormality.

## 2018-01-04 IMAGING — CT CT ABD-PELV W/ CM
2 of 5 series · 16 of 46 positions shown, 18 images · IV contrast (APPLIED)
Comparison: Lumbar spine MR dated 10/05/2009.

CLINICAL DATA: Cough, nausea, vomiting, diarrhea and chills since
last night.

EXAM:
CT ABDOMEN AND PELVIS WITH CONTRAST
TECHNIQUE: Multidetector CT imaging of the abdomen and pelvis was performed
using the standard protocol following bolus administration of
intravenous contrast.
CONTRAST:  100mL BLSTQ7-ZRR IOPAMIDOL (BLSTQ7-ZRR) INJECTION 61%

[Series 2: axial st · axial · 0.98mm/px · z∈[-692,-188]mm · 13 of 113 slices shown, 15 images]
[im 6/113  soft-tissue]
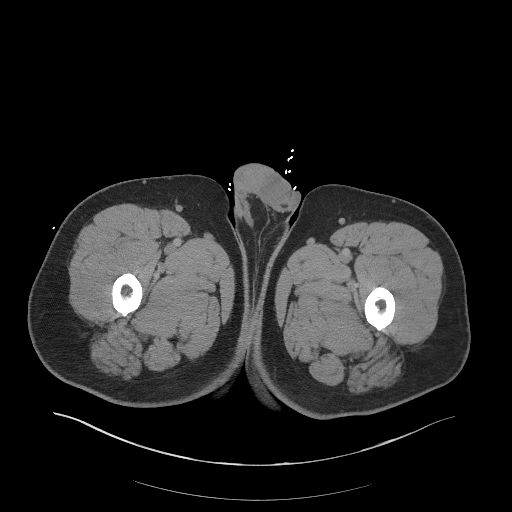
[im 6/113  bone]
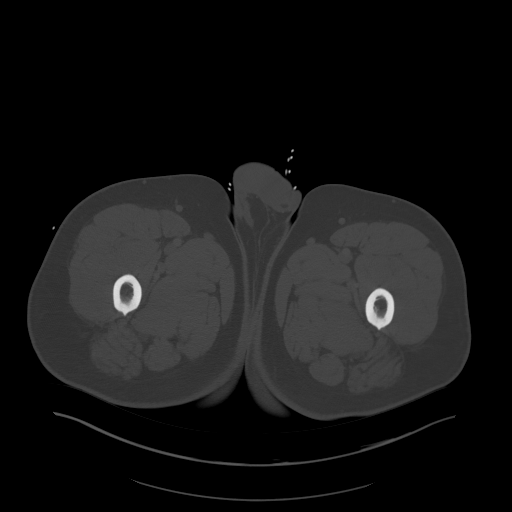
[im 17/113  soft-tissue]
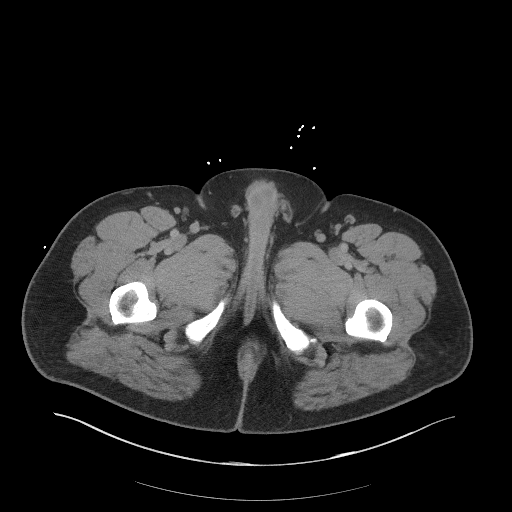
[im 23/113  soft-tissue]
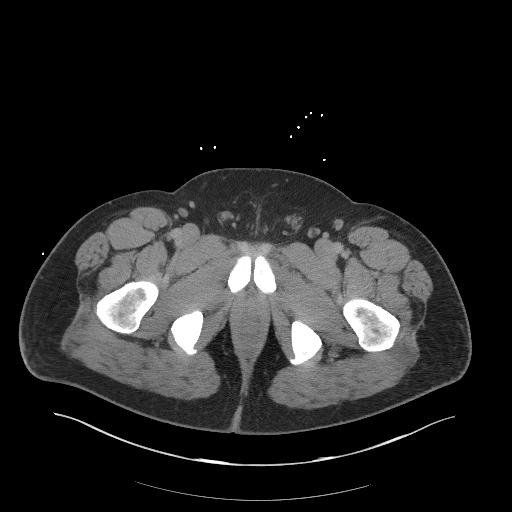
[im 34/113  soft-tissue]
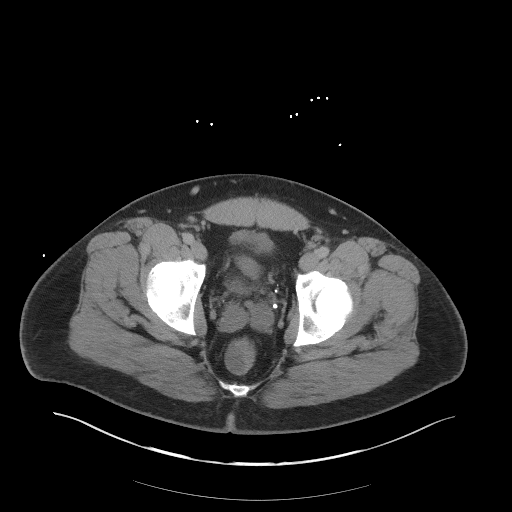
[im 40/113  soft-tissue]
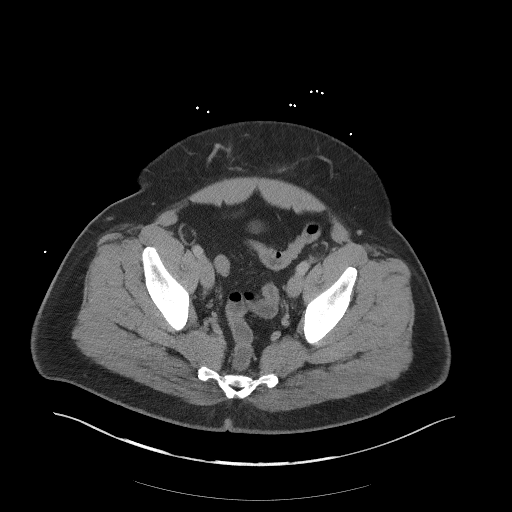
[im 51/113  soft-tissue]
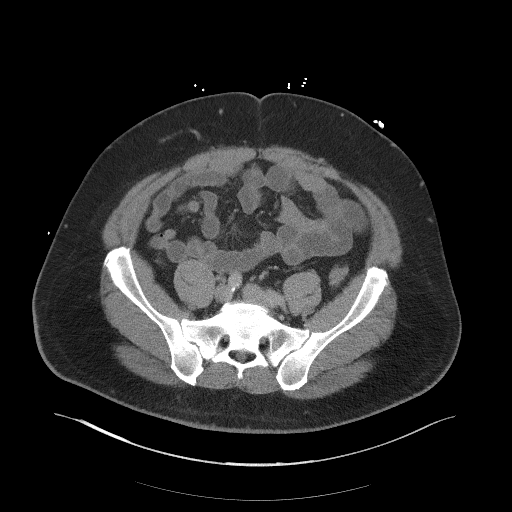
[im 57/113  soft-tissue]
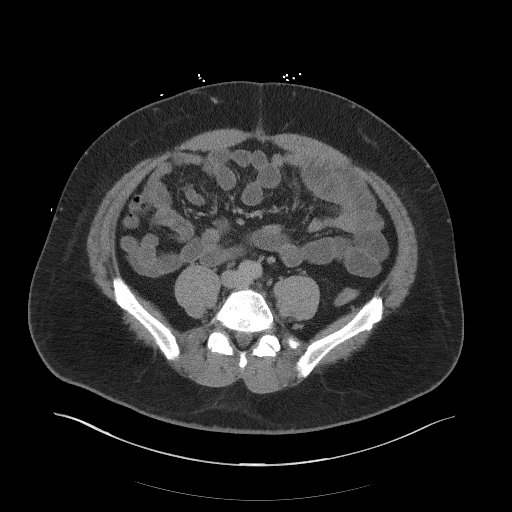
[im 62/113  soft-tissue]
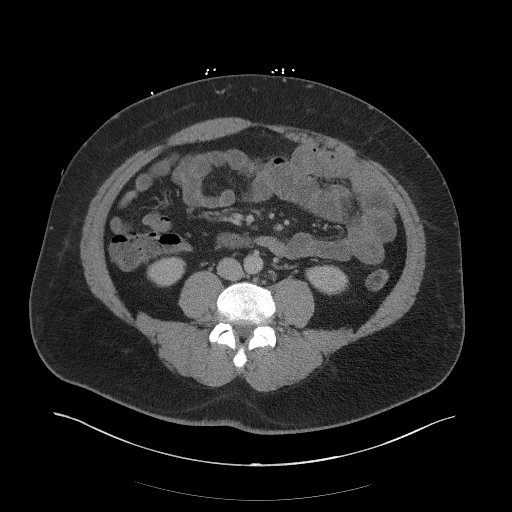
[im 73/113  soft-tissue]
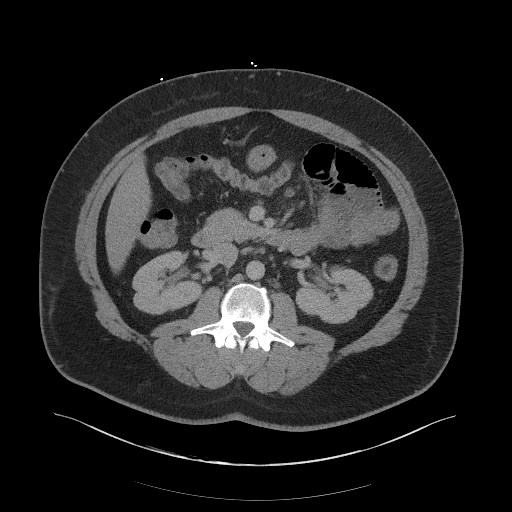
[im 73/113  bone]
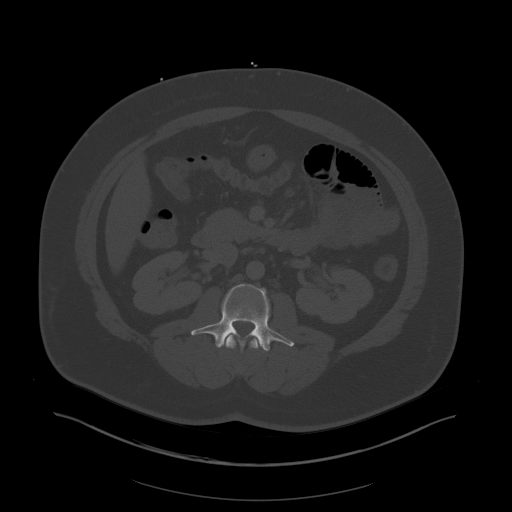
[im 79/113  soft-tissue]
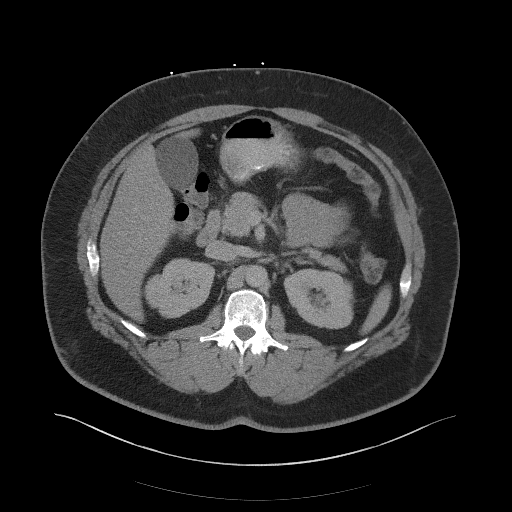
[im 90/113  soft-tissue]
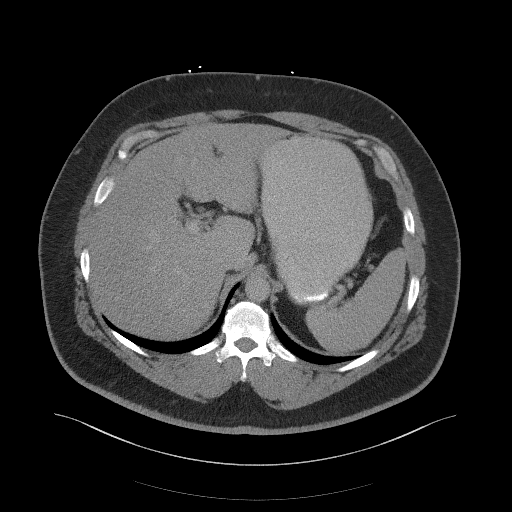
[im 96/113  soft-tissue]
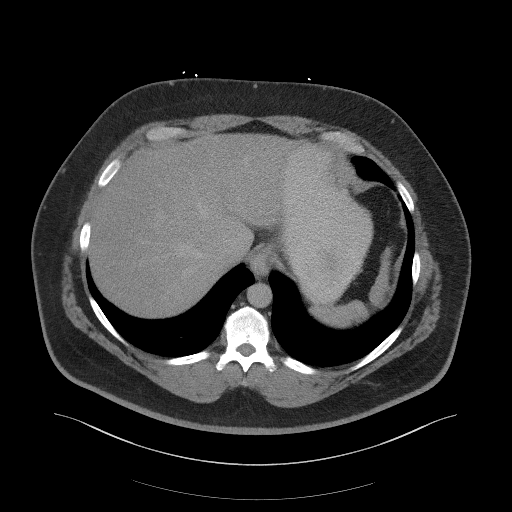
[im 107/113  soft-tissue]
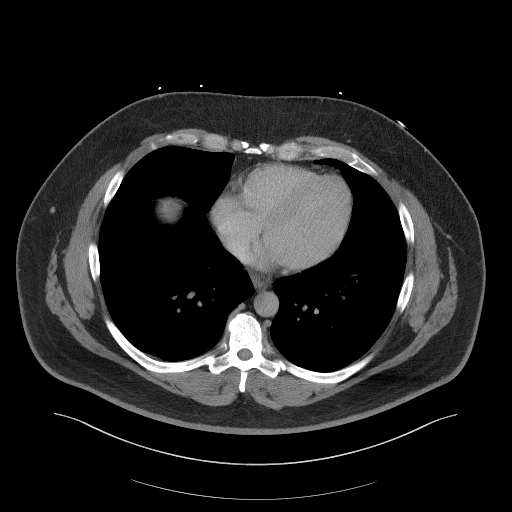

[Series 4: coronal st · coronal · 0.81mm/px · 3 of 110 slices shown]
[im 37/110  soft-tissue]
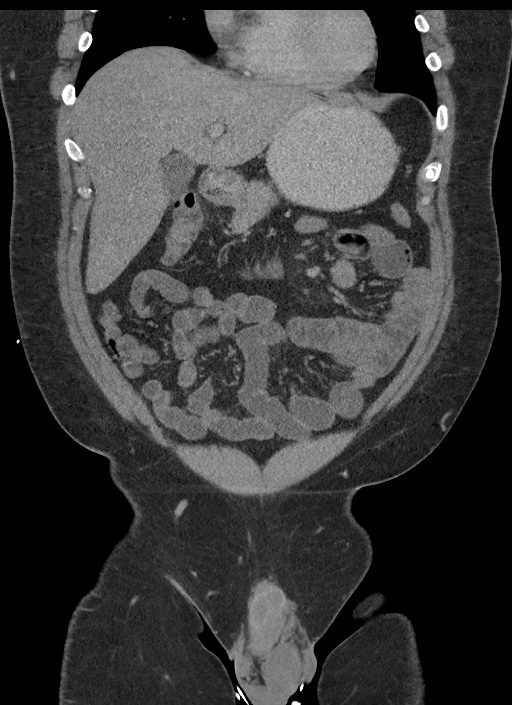
[im 49/110  soft-tissue]
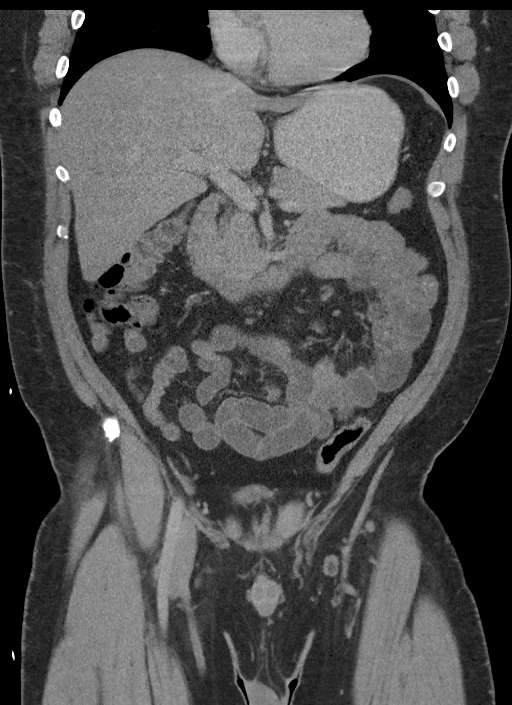
[im 61/110  soft-tissue]
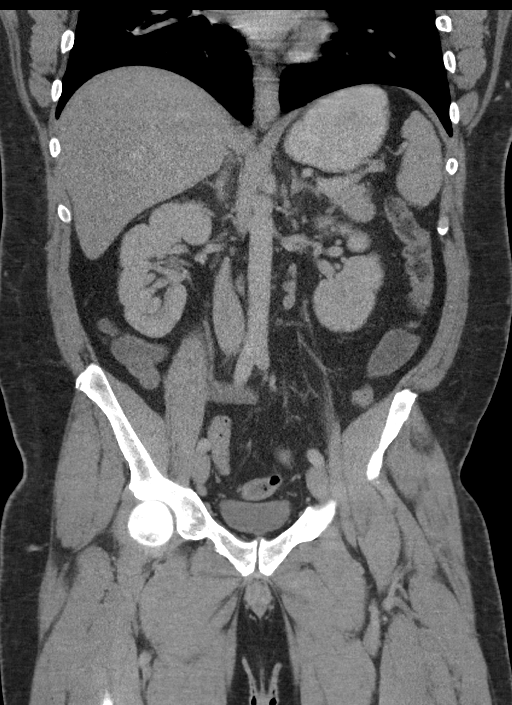

[16 of 46 positions shown; findings below may reference images not displayed]

FINDINGS: Lower chest: No acute abnormality.

Hepatobiliary: Mild diffuse low density of the liver. Normal
appearing gallbladder.

Pancreas: Unremarkable. No pancreatic ductal dilatation or
surrounding inflammatory changes.

Spleen: Normal in size without focal abnormality.

Adrenals/Urinary Tract: Multiple small low-density areas in the left
kidney. Normal appearing right kidney, ureters, urinary bladder and
adrenal glands.

Stomach/Bowel: Multiple fluid filled, normal caliber loops of small
bowel and colon. Normal appearing stomach and appendix.

Vascular/Lymphatic: No abnormally enlarged lymph nodes. Minimal
aortic and iliac artery calcification without aneurysm.

Reproductive: Normal sized prostate gland.

Other: None.

Musculoskeletal: Lumbar and lower thoracic spine degenerative
changes.
IMPRESSION: 1. Multiple fluid filled, normal caliber loops of small bowel and
colon. This can be seen with gastroenteritis.
2. Mild diffuse hepatic steatosis.
3. Minimal aortic atherosclerosis.
4. Probable small left renal cysts.

## 2018-01-16 NOTE — Progress Notes (Signed)
Assessment and Plan:  Scrotum pain Right testicular pain-? Hydrocele, will refer for evaluation- no hernia appreciated on exam.  -     C. trachomatis/N. gonorrhoeae RNA -     Ambulatory referral to Urology -     Urinalysis, Routine w reflex microscopic -     Urine Culture  BPH with obstruction/lower urinary tract symptoms -     C. trachomatis/N. gonorrhoeae RNA -     Ambulatory referral to Urology -     Urinalysis, Routine w reflex microscopic -     Urine Culture - symptoms okay at this time- suggest not taking ABX- has been on several rounds    HPI 52 y.o.male with history of BPH, testosterone replacement presents for pain in scrotum x 1 year.   States 1 year ago he had a painful ejaculation after intercourse with his GF, he came in, thought prostatitis given cipro and another medication. He has had abnormal urination x  one and off x 1 year. Could not tolerate flomax, can only tolerate 1/4 of doxazosin 4 mg. Then again 2 month ago he was having intercourse again, felt like he was "kicked" in the nuts after ejaculation, has been on bactrim x 2 months. Relieved with sitting. S/p vasectomy 8-9 years ago.   Currently he is getting up 2 x a night, trying to drink less at night. He currently does not have dribbling or weak stream. He has more right testicle pain, does not feel swollen and states dull ache that he can not pin point the area.   Lab Results  Component Value Date   PSA 0.4 02/16/2017   PSA 0.4 02/24/2016   PSA 0.4 01/19/2016     Patient Active Problem List   Diagnosis Date Noted  . Prostatism 12/05/2017  . Abdominal aortic aneurysm ([place]) 01/19/2016  . Paroxysmal A-fib ([place]) 10/12/2015  . Medication management 12/03/2013  . Morbid obesity (BMI 37.55) 08/05/2013  . Prediabetes 04/29/2013  . Hypertension   . Hyperlipidemia   . Testosterone deficiency   . Vitamin D deficiency   . Depression, major, in remission ([place])     Allergies  Allergen Reactions  .  Zocor [Simvastatin] Other (See Comments)    cpk elev  . Crestor [Rosuvastatin] Palpitations    Current Outpatient Medications on File Prior to Visit  Medication Sig  . aspirin 81 MG tablet Take 81 mg by mouth daily.  Marland Kitchen atenolol (TENORMIN) 100 MG tablet TAKE 1 TABLET BY MOUTH DAILY  . Cholecalciferol (VITAMIN D3) 5000 units CAPS TAKE 1 CAPSULE BY MOUTH DAILY  . cyclobenzaprine (FLEXERIL) 10 MG tablet Take 1/2 to 1 tablet 2 to 3 x /day as needed for muscle spasm  . doxazosin (CARDURA) 4 MG tablet Take 1 tablet at Bedtime for BP & Prostate (Patient taking differently: Take 1/4 tablet at Bedtime for BP & Prostate)  . enalapril (VASOTEC) 20 MG tablet TAKE 1 TABLET BY MOUTH DAILY  . finasteride (PROSCAR) 5 MG tablet TAKE 1 TABLET BY MOUTH DAILY FOR PROSTATE  . fluorouracil (EFUDEX) 5 % cream Apply to suspect areas 2 x / day til skin raw (Patient taking differently: as needed. Apply to suspect areas 2 x / day til skin raw)  . hydrochlorothiazide (HYDRODIURIL) 25 MG tablet TAKE 1 TABLET BY MOUTH DAILY  . minoxidil (LONITEN) 10 MG tablet TAKE 1/2 TO 1 TABLET BY MOUTH EVERY DAY FOR BLOOD PRESSURE  . pravastatin (PRAVACHOL) 20 MG tablet TAKE 1 TABLET BY MOUTH DAILY  .  testosterone cypionate (DEPOTESTOSTERONE CYPIONATE) 200 MG/ML injection 2cc q 2 weeks and needs 3 cc syringes And 1" 21 G needles  . verapamil (CALAN-SR) 240 MG CR tablet TAKE 1 TABLET BY MOUTH DAILY WITH FOOD FOR BLOOD PRESSURE   No current facility-administered medications on file prior to visit.     ROS: all negative except above.   Physical Exam: Filed Weights   01/17/18 0849  Weight: 269 lb (122 kg)   BP 120/88   Ht 6' (1.829 m)   Wt 269 lb (122 kg)   BMI 36.48 kg/m  General Appearance: Well nourished, in no apparent distress. Eyes: PERRLA, EOMs, conjunctiva no swelling or erythema Sinuses: No Frontal/maxillary tenderness ENT/Mouth: Ext aud canals clear, TMs without erythema, bulging. No erythema, swelling, or exudate  on post pharynx.  Tonsils not swollen or erythematous. Hearing normal.  Neck: Supple, thyroid normal.  Respiratory: Respiratory effort normal, BS equal bilaterally without rales, rhonchi, wheezing or stridor.  Cardio: RRR with no MRGs. Brisk peripheral pulses without edema.  Abdomen: Soft, + BS.  Non tender, no guarding, rebound, hernias, masses. Lymphatics: Non tender without lymphadenopathy.  Testicular: No hernia appreciated, penis normal without rash, lesions, discharge. Right testicle slightly swollen compared to left, slightly tender to palpation.  Musculoskeletal: Full ROM, 5/5 strength, normal gait.  Skin: Warm, dry without rashes, lesions, ecchymosis.  Neuro: Cranial nerves intact. Normal muscle tone, no cerebellar symptoms. Sensation intact.  Psych: Awake and oriented X 3, normal affect, Insight and Judgment appropriate.     Vicie Mutters, PA-C 9:09 AM [facility]

## 2018-01-17 ENCOUNTER — Ambulatory Visit: Payer: Managed Care, Other (non HMO) | Admitting: Physician Assistant

## 2018-01-17 ENCOUNTER — Encounter: Payer: Self-pay | Admitting: Physician Assistant

## 2018-01-17 VITALS — BP 120/88 | Ht 72.0 in | Wt 269.0 lb

## 2018-01-17 DIAGNOSIS — Z202 Contact with and (suspected) exposure to infections with a predominantly sexual mode of transmission: Secondary | ICD-10-CM

## 2018-01-17 DIAGNOSIS — E349 Endocrine disorder, unspecified: Secondary | ICD-10-CM | POA: Diagnosis not present

## 2018-01-17 DIAGNOSIS — N5082 Scrotal pain: Secondary | ICD-10-CM

## 2018-01-17 DIAGNOSIS — N401 Enlarged prostate with lower urinary tract symptoms: Secondary | ICD-10-CM | POA: Diagnosis not present

## 2018-01-17 DIAGNOSIS — I10 Essential (primary) hypertension: Secondary | ICD-10-CM

## 2018-01-17 DIAGNOSIS — N138 Other obstructive and reflux uropathy: Secondary | ICD-10-CM

## 2018-01-17 MED ORDER — TESTOSTERONE CYPIONATE 200 MG/ML IM SOLN
200.0000 mg | INTRAMUSCULAR | Status: DC
  Administered 2018-01-17: 200 mg via INTRAMUSCULAR

## 2018-01-17 NOTE — Patient Instructions (Signed)
Hydrocele, Adult A hydrocele is a collection of fluid in the loose pouch of skin that holds the testicles (scrotum). Usually, it affects only one testicle. What are the causes? This condition may be caused by:  An injury to the scrotum.  An infection.  A tumor or cancer of the testicle.  Twisting of a testicle.  Decreased blood flow to the scrotum.  What are the signs or symptoms? A hydrocele feels like a water-filled balloon. It may also feel heavy. A hydrocele can cause:  Swelling of the scrotum. The swelling may decrease when you lie down.  Swelling of the groin.  Mild discomfort in the scrotum.  Pain. This can develop if the hydrocele was caused by infection or twisting.  How is this diagnosed? This condition may be diagnosed with a medical history, physical exam, and imaging tests. You may also have blood and urine tests to check for infection. How is this treated? Treatment may include:  Watching and waiting, particularly if the hydrocele causes no symptoms.  Treatment of the underlying condition. This may include using antibiotic medicine.  Surgery to drain the fluid. Some surgical options include: ? Needle aspiration. For this procedure, a needle is used to drain fluid. ? Hydrocelectomy. For this procedure, an incision is made in the scrotum to remove the fluid sac.  Follow these instructions at home:  Keep all follow-up visits as told by your health care provider. This is important.  Watch the hydrocele for any changes.  Take over-the-counter and prescription medicines only as told by your health care provider.  If you were prescribed an antibiotic medicine, use it as told by your health care provider. Do not stop using the antibiotic even if your condition improves. Contact a health care provider if:  The swelling in your scrotum or groin gets worse.  The hydrocele becomes red, firm, tender to the touch, or painful.  You notice any changes in the  hydrocele.  You have a fever. This information is not intended to replace advice given to you by your health care provider. Make sure you discuss any questions you have with your health care provider. Document Released: 09/22/2009 Document Revised: 09/10/2015 Document Reviewed: 03/31/2014 [employer] Interactive Patient Education  2018 [employer]  

## 2018-01-19 LAB — C. TRACHOMATIS/N. GONORRHOEAE RNA
C. trachomatis RNA, TMA: NOT DETECTED
N. gonorrhoeae RNA, TMA: NOT DETECTED

## 2018-01-19 LAB — URINE CULTURE
MICRO NUMBER:: 91186215
Result:: NO GROWTH
SPECIMEN QUALITY:: ADEQUATE

## 2018-01-19 LAB — URINALYSIS, ROUTINE W REFLEX MICROSCOPIC
Bilirubin Urine: NEGATIVE
Glucose, UA: NEGATIVE
Hgb urine dipstick: NEGATIVE
Ketones, ur: NEGATIVE
Leukocytes, UA: NEGATIVE
Nitrite: NEGATIVE
Protein, ur: NEGATIVE
Specific Gravity, Urine: 1.022 (ref 1.001–1.03)
pH: <=5 (ref 5.0–8.0)

## 2018-03-03 ENCOUNTER — Other Ambulatory Visit: Payer: Self-pay | Admitting: Internal Medicine

## 2018-03-03 DIAGNOSIS — E782 Mixed hyperlipidemia: Secondary | ICD-10-CM

## 2018-03-08 ENCOUNTER — Other Ambulatory Visit: Payer: Self-pay | Admitting: Internal Medicine

## 2018-03-14 ENCOUNTER — Other Ambulatory Visit: Payer: Self-pay | Admitting: Internal Medicine

## 2018-03-14 DIAGNOSIS — E349 Endocrine disorder, unspecified: Secondary | ICD-10-CM

## 2018-03-14 MED ORDER — TESTOSTERONE CYPIONATE 200 MG/ML IM SOLN: INTRAMUSCULAR | 2 refills | Status: DC

## 2018-03-19 ENCOUNTER — Other Ambulatory Visit: Payer: Self-pay | Admitting: Internal Medicine

## 2018-03-19 DIAGNOSIS — E349 Endocrine disorder, unspecified: Secondary | ICD-10-CM

## 2018-03-19 MED ORDER — TESTOSTERONE CYPIONATE 200 MG/ML IM SOLN: INTRAMUSCULAR | 2 refills | Status: DC

## 2018-03-28 ENCOUNTER — Encounter: Payer: Self-pay | Admitting: Internal Medicine

## 2018-04-03 ENCOUNTER — Encounter: Payer: Self-pay | Admitting: Internal Medicine

## 2018-04-03 ENCOUNTER — Ambulatory Visit: Payer: Managed Care, Other (non HMO) | Admitting: Internal Medicine

## 2018-04-03 VITALS — BP 140/84 | HR 64 | Temp 97.4°F | Resp 18 | Ht 72.0 in | Wt 273.6 lb

## 2018-04-03 DIAGNOSIS — Z1329 Encounter for screening for other suspected endocrine disorder: Secondary | ICD-10-CM | POA: Diagnosis not present

## 2018-04-03 DIAGNOSIS — I10 Essential (primary) hypertension: Secondary | ICD-10-CM | POA: Diagnosis not present

## 2018-04-03 DIAGNOSIS — Z1211 Encounter for screening for malignant neoplasm of colon: Secondary | ICD-10-CM

## 2018-04-03 DIAGNOSIS — N138 Other obstructive and reflux uropathy: Secondary | ICD-10-CM

## 2018-04-03 DIAGNOSIS — Z79899 Other long term (current) drug therapy: Secondary | ICD-10-CM | POA: Diagnosis not present

## 2018-04-03 DIAGNOSIS — Z0000 Encounter for general adult medical examination without abnormal findings: Secondary | ICD-10-CM | POA: Diagnosis not present

## 2018-04-03 DIAGNOSIS — Z8249 Family history of ischemic heart disease and other diseases of the circulatory system: Secondary | ICD-10-CM

## 2018-04-03 DIAGNOSIS — Z130 Encounter for screening for diseases of the blood and blood-forming organs and certain disorders involving the immune mechanism: Secondary | ICD-10-CM

## 2018-04-03 DIAGNOSIS — R5383 Other fatigue: Secondary | ICD-10-CM

## 2018-04-03 DIAGNOSIS — Z131 Encounter for screening for diabetes mellitus: Secondary | ICD-10-CM | POA: Diagnosis not present

## 2018-04-03 DIAGNOSIS — Z136 Encounter for screening for cardiovascular disorders: Secondary | ICD-10-CM | POA: Diagnosis not present

## 2018-04-03 DIAGNOSIS — E559 Vitamin D deficiency, unspecified: Secondary | ICD-10-CM

## 2018-04-03 DIAGNOSIS — Z1389 Encounter for screening for other disorder: Secondary | ICD-10-CM | POA: Diagnosis not present

## 2018-04-03 DIAGNOSIS — I700 Atherosclerosis of aorta: Secondary | ICD-10-CM | POA: Diagnosis not present

## 2018-04-03 DIAGNOSIS — Z0001 Encounter for general adult medical examination with abnormal findings: Secondary | ICD-10-CM

## 2018-04-03 DIAGNOSIS — E782 Mixed hyperlipidemia: Secondary | ICD-10-CM

## 2018-04-03 DIAGNOSIS — Z111 Encounter for screening for respiratory tuberculosis: Secondary | ICD-10-CM

## 2018-04-03 DIAGNOSIS — Z13220 Encounter for screening for lipoid disorders: Secondary | ICD-10-CM | POA: Diagnosis not present

## 2018-04-03 DIAGNOSIS — Z125 Encounter for screening for malignant neoplasm of prostate: Secondary | ICD-10-CM

## 2018-04-03 DIAGNOSIS — N401 Enlarged prostate with lower urinary tract symptoms: Secondary | ICD-10-CM

## 2018-04-03 DIAGNOSIS — R7303 Prediabetes: Secondary | ICD-10-CM

## 2018-04-03 DIAGNOSIS — E349 Endocrine disorder, unspecified: Secondary | ICD-10-CM

## 2018-04-03 DIAGNOSIS — Z1212 Encounter for screening for malignant neoplasm of rectum: Secondary | ICD-10-CM

## 2018-04-03 NOTE — Patient Instructions (Signed)

## 2018-04-03 NOTE — Progress Notes (Addendum)
[facility]   Nicholas Crosby, M.D.     Nicholas Crosby, P.A.-C Nicholas Crosby, [place]                [address] [place], [place] [address] Telephone [phone] Telefax [phone] Annual  Screening/Preventative Visit  & Comprehensive Evaluation & Examination     This very nice 52 y.o. [name] presents for a Screening /Preventative Visit & comprehensive evaluation and management of multiple medical co-morbidities.  Patient has been followed for HTN, HLD, T2_NIDDM  Prediabetes and Vitamin D Deficiency.     HTN predates circa 36 (age 3 yo). Patient's BP has been controlled at home.  He has hx/o labile BP and previously Renal Artery U/S was Negative (2017).  Today's BP was initially sl elevated & rechecked at goal - 140/84. Patient has 1 remote episode of pAfib reverting spontaneously (CHADsVASc 1) and is on prophylaxis with LD bASA. Patient denies any cardiac symptoms as chest pain, palpitations, shortness of breath, dizziness or ankle swelling.     Patient's hyperlipidemia is controlled with diet and medications. Patient denies myalgias or other medication SE's. Current  lipids are not at goal w/elev. LDL & elevated Trig's: Lab Results  Component Value Date   CHOL 201 (H) 04/03/2018   HDL 41 04/03/2018   LDLCALC 135 (H) 04/03/2018   TRIG 124 04/03/2018   CHOLHDL 4.9 04/03/2018      Patient has hx/o prediabetes (A1c 6.4% /  2012 & 5.8% / 2014) and patient denies reactive hypoglycemic symptoms, visual blurring, diabetic polys or paresthesias. Current A1c is at goal: Lab Results  Component Value Date   HGBA1C 5.5 04/03/2018       Patient has hx/o Low Testosterone ("191" / 2012 & "150" / 2014) and is on parenteral replacement w/Depo-Testosterone with improved stamina & sense of well being.      Finally, patient has history of Vitamin D Deficiency ("21" / 2008) and last vitamin D was near goal  (70-100): Lab Results  Component Value Date   VD25OH 60 04/03/2018   Current Outpatient Medications on File Prior to Visit  Medication Sig  . aspirin 81 MG tablet Take 81 mg by mouth daily.  Marland Kitchen atenolol (TENORMIN) 100 MG tablet TAKE 1 TABLET BY MOUTH DAILY  . Cholecalciferol (VITAMIN D3) 5000 units CAPS TAKE 1 CAPSULE BY MOUTH DAILY  . cyclobenzaprine (FLEXERIL) 10 MG tablet Take 1/2 to 1 tablet 2 to 3 x /day as needed for muscle spasm  . doxazosin (CARDURA) 4 MG tablet Take 1 tablet at Bedtime for BP & Prostate (Patient taking differently: Take 1/4 tablet at Bedtime for BP & Prostate)  . enalapril (VASOTEC) 20 MG tablet TAKE 1 TABLET BY MOUTH DAILY  . finasteride (PROSCAR) 5 MG tablet TAKE 1 TABLET BY MOUTH DAILY FOR PROSTATE  . fluorouracil (EFUDEX) 5 % cream Apply to suspect areas 2 x / day til skin raw (Patient taking differently: as needed. Apply to suspect areas 2 x / day til skin raw)  . hydrochlorothiazide (HYDRODIURIL) 25 MG tablet TAKE 1 TABLET BY MOUTH DAILY  . minoxidil (LONITEN) 10 MG tablet TAKE 1/2 TO 1 TABLET BY MOUTH EVERY DAY FOR BLOOD PRESSURE  . testosterone cypionate (DEPOTESTOSTERONE CYPIONATE) 200 MG/ML injection Inject 2cc IM q 2 weeks and needs 3 cc syringes &  1" 21 G needles  . verapamil (  CALAN-SR) 240 MG CR tablet TAKE 1 TABLET BY MOUTH DAILY WITH FOOD FOR BLOOD PRESSURE   No current facility-administered medications on file prior to visit.    Allergies  Allergen Reactions  . Zocor [Simvastatin] Other (See Comments)    cpk elev  . Crestor [Rosuvastatin] Palpitations   Past Medical History:  Diagnosis Date  . Depression   . Hyperlipidemia   . Hypertension   . Prostate enlargement    Health Maintenance  Topic Date Due  . PNEUMOCOCCAL POLYSACCHARIDE VACCINE AGE 42-64 HIGH RISK  11/11/1968  . FOOT EXAM  11/11/1976  . OPHTHALMOLOGY EXAM  04/24/2018 (Originally 11/11/1976)  . HEMOGLOBIN A1C  10/03/2018  . Fecal DNA (Cologuard)  03/25/2020  . TETANUS/TDAP   05/17/2025  . INFLUENZA VACCINE  Completed  . HIV Screening  Completed   Immunization History  Administered Date(s) Administered  . Influenza-Unspecified 01/17/2015, 01/16/2017, 01/16/2018  . Tdap 01/09/2007, 05/18/2015   - Negative Cologard 03/25/2017 - due 3 year f/u in Dec 2021  - had (+) PPD in 2008 & Negative TB Gold in 11/2017.  Past Surgical History:  Procedure Laterality Date  . COSMETIC SURGERY     liposuction  . CYST REMOVAL NECK    . NO PAST SURGERIES    . VASECTOMY  04/06/2012   Procedure: VASECTOMY;  Surgeon: Alexis Frock, MD;  Location: [facility];  Service: Urology;  Laterality: Bilateral;   Family History  Problem Relation Age of Onset  . Hypertension Mother   . Hyperlipidemia Mother   . Diabetes Mother   . Cancer Mother        breast  . Heart disease Father   . Hyperlipidemia Father   . Hypertension Father    Social History   Socioeconomic History  . Marital status: Divorced  . Number of children: 1 son & 1 daughter  Occupational History  . Firefighter / [employer]   Tobacco Use  . Smoking status: Never Smoker  . Smokeless tobacco: Never Used  Substance and Sexual Activity  . Alcohol use: Yes    Alcohol/week: 2.0 standard drinks    Types: 2 Cans of beer per week    Comment: social  . Drug use: No  . Sexual activity: Active    ROS Constitutional: Denies fever, chills, weight loss/gain, headaches, insomnia,  night sweats or change in appetite. Does c/o fatigue. Eyes: Denies redness, blurred vision, diplopia, discharge, itchy or watery eyes.  ENT: Denies discharge, congestion, post nasal drip, epistaxis, sore throat, earache, hearing loss, dental pain, Tinnitus, Vertigo, Sinus pain or snoring.  Cardio: Denies chest pain, palpitations, irregular heartbeat, syncope, dyspnea, diaphoresis, orthopnea, PND, claudication or edema Respiratory: denies cough, dyspnea, DOE, pleurisy, hoarseness, laryngitis or wheezing.  Gastrointestinal:  Denies dysphagia, heartburn, reflux, water brash, pain, cramps, nausea, vomiting, bloating, diarrhea, constipation, hematemesis, melena, hematochezia, jaundice or hemorrhoids Genitourinary: Denies dysuria, frequency, urgency, nocturia, hesitancy, discharge, hematuria or flank pain Musculoskeletal: Denies arthralgia, myalgia, stiffness, Jt. Swelling, pain, limp or strain/sprain. Denies Falls. Skin: Denies puritis, rash, hives, warts, acne, eczema or change in skin lesion Neuro: No weakness, tremor, incoordination, spasms, paresthesia or pain Psychiatric: Denies confusion, memory loss or sensory loss. Denies Depression. Endocrine: Denies change in weight, skin, hair change, nocturia, and paresthesia, diabetic polys, visual blurring or hyper / hypo glycemic episodes.  Heme/Lymph: No excessive bleeding, bruising or enlarged lymph nodes.  Physical Exam  BP 140/84   Pulse 64   Temp (!) 97.4 F (36.3 C)   Resp 18  Ht 6' (1.829 m)   Wt 273 lb 9.6 oz (124.1 kg)   BMI 37.11 kg/m   General Appearance: Well nourished and well groomed and in no apparent distress.  Eyes: PERRLA, EOMs, conjunctiva no swelling or erythema, normal fundi and vessels. Sinuses: No frontal/maxillary tenderness ENT/Mouth: EACs patent / TMs  nl. Nares clear without erythema, swelling, mucoid exudates. Oral hygiene is good. No erythema, swelling, or exudate. Tongue normal, non-obstructing. Tonsils not swollen or erythematous. Hearing normal.  Neck: Supple, thyroid not palpable. No bruits, nodes or JVD. Respiratory: Respiratory effort normal.  BS equal and clear bilateral without rales, rhonci, wheezing or stridor. Cardio: Heart sounds are normal with regular rate and rhythm and no murmurs, rubs or gallops. Peripheral pulses are normal and equal bilaterally without edema. No aortic or femoral bruits. Chest: symmetric with normal excursions and percussion.  Abdomen: Soft, with Nl bowel sounds. Nontender, no guarding, rebound,  hernias, masses, or organomegaly.  Lymphatics: Non tender without lymphadenopathy.  Genitourinary: No hernias.Testes nl. DRE - prostate nl for age - smooth & firm w/o nodules. Musculoskeletal: Full ROM all peripheral extremities, joint stability, 5/5 strength, and normal gait. Skin: Warm and dry without rashes, lesions, cyanosis, clubbing or  ecchymosis.  Neuro: Cranial nerves intact, reflexes equal bilaterally. Normal muscle tone, no cerebellar symptoms. Sensation intact.  Pysch: Alert and oriented X 3 with normal affect, insight and judgment appropriate.   Assessment and Plan  1. Annual Preventative/Screening Exam   2. Essential hypertension  - EKG 12-Lead - [place], RETROPERITNL ABD,  LTD - Urinalysis, Routine w reflex microscopic - Microalbumin / creatinine urine ratio - CBC with Differential/Platelet - COMPLETE METABOLIC PANEL WITH GFR - Magnesium - TSH  3. Hyperlipidemia, mixed  - EKG 12-Lead - [place], RETROPERITNL ABD,  LTD - Lipid panel - TSH  4. Prediabetes  - EKG 12-Lead - [place], RETROPERITNL ABD,  LTD - Hemoglobin A1c - Insulin, random  5. Vitamin D deficiency  - VITAMIN D 25 Hydroxyl  6. Testosterone deficiency  - Testosterone  7. BPH with obstruction/lower urinary tract symptoms  - PSA  8. Screening for colorectal cancer  - POC Hemoccult Bld/Stl  9. Prostate cancer screening  - PSA  10. Screening for ischemic heart disease  - EKG 12-Lead - Lipid panel  11. FHx: heart disease  - EKG 12-Lead - [place], RETROPERITNL ABD,  LTD  12. Abdominal aortic atherosclerosis (HCC)  - [place], RETROPERITNL ABD,  LTD  13. Screening for AAA (aortic abdominal aneurysm)  - [place], RETROPERITNL ABD,  LTD  14. Screening-pulmonary TB   15. Fatigue  - Iron,Total/Total Iron Binding Cap - Vitamin B12 - Testosterone - CBC with Differential/Platelet  16. Medication management  - Urinalysis, Routine w reflex microscopic - Microalbumin / creatinine urine ratio - CBC  with Differential/Platelet - COMPLETE METABOLIC PANEL WITH GFR - Magnesium - Lipid panel - TSH - Hemoglobin A1c - Insulin, random - VITAMIN D 25 Hydroxyl         Patient was counseled in prudent diet, weight control to achieve/maintain BMI less than 25, BP monitoring, regular exercise and medications as discussed.  Discussed med effects and SE's. Routine screening labs and tests as requested with regular follow-up as recommended. Over 40 minutes of exam, counseling, chart review and high complex critical decision making was performed

## 2018-04-04 ENCOUNTER — Other Ambulatory Visit: Payer: Self-pay | Admitting: Internal Medicine

## 2018-04-04 DIAGNOSIS — E782 Mixed hyperlipidemia: Secondary | ICD-10-CM

## 2018-04-04 LAB — URINALYSIS, ROUTINE W REFLEX MICROSCOPIC
Bilirubin Urine: NEGATIVE
Glucose, UA: NEGATIVE
Hgb urine dipstick: NEGATIVE
Ketones, ur: NEGATIVE
Leukocytes, UA: NEGATIVE
Nitrite: NEGATIVE
Protein, ur: NEGATIVE
Specific Gravity, Urine: 1.011 (ref 1.001–1.03)
pH: 5.5 (ref 5.0–8.0)

## 2018-04-04 LAB — CBC WITH DIFFERENTIAL/PLATELET
Absolute Monocytes: 650 cells/uL (ref 200–950)
Basophils Absolute: 47 cells/uL (ref 0–200)
Basophils Relative: 0.7 %
Eosinophils Absolute: 107 cells/uL (ref 15–500)
Eosinophils Relative: 1.6 %
HCT: 44 % (ref 38.5–50.0)
Hemoglobin: 14.8 g/dL (ref 13.2–17.1)
Lymphs Abs: 2111 cells/uL (ref 850–3900)
MCH: 27.6 pg (ref 27.0–33.0)
MCHC: 33.6 g/dL (ref 32.0–36.0)
MCV: 81.9 fL (ref 80.0–100.0)
MPV: 10.7 fL (ref 7.5–12.5)
Monocytes Relative: 9.7 %
Neutro Abs: 3786 cells/uL (ref 1500–7800)
Neutrophils Relative %: 56.5 %
Platelets: 312 10*3/uL (ref 140–400)
RBC: 5.37 10*6/uL (ref 4.20–5.80)
RDW: 13.8 % (ref 11.0–15.0)
Total Lymphocyte: 31.5 %
WBC: 6.7 10*3/uL (ref 3.8–10.8)

## 2018-04-04 LAB — MICROALBUMIN / CREATININE URINE RATIO
Creatinine, Urine: 76 mg/dL (ref 20–320)
Microalb Creat Ratio: 3 mcg/mg creat (ref <30)
Microalb, Ur: 0.2 mg/dL

## 2018-04-04 LAB — COMPLETE METABOLIC PANEL WITH GFR
AG Ratio: 1.6 (calc) (ref 1.0–2.5)
ALT: 30 U/L (ref 9–46)
AST: 18 U/L (ref 10–35)
Albumin: 4.4 g/dL (ref 3.6–5.1)
Alkaline phosphatase (APISO): 36 U/L — ABNORMAL LOW (ref 40–115)
BUN: 15 mg/dL (ref 7–25)
CO2: 33 mmol/L — ABNORMAL HIGH (ref 20–32)
Calcium: 10 mg/dL (ref 8.6–10.3)
Chloride: 98 mmol/L (ref 98–110)
Creat: 0.96 mg/dL (ref 0.70–1.33)
GFR, Est African American: 106 mL/min/{1.73_m2} (ref >=60)
GFR, Est Non African American: 91 mL/min/{1.73_m2} (ref >=60)
Globulin: 2.8 g/dL (calc) (ref 1.9–3.7)
Glucose, Bld: 83 mg/dL (ref 65–99)
Potassium: 3.7 mmol/L (ref 3.5–5.3)
Sodium: 139 mmol/L (ref 135–146)
Total Bilirubin: 0.6 mg/dL (ref 0.2–1.2)
Total Protein: 7.2 g/dL (ref 6.1–8.1)

## 2018-04-04 LAB — IRON, TOTAL/TOTAL IRON BINDING CAP
%SAT: 38 % (calc) (ref 20–48)
Iron: 117 ug/dL (ref 50–180)
TIBC: 311 mcg/dL (calc) (ref 250–425)

## 2018-04-04 LAB — LIPID PANEL
Cholesterol: 201 mg/dL — ABNORMAL HIGH (ref <200)
HDL: 41 mg/dL (ref >40)
LDL Cholesterol (Calc): 135 mg/dL (calc) — ABNORMAL HIGH
Non-HDL Cholesterol (Calc): 160 mg/dL (calc) — ABNORMAL HIGH (ref <130)
Total CHOL/HDL Ratio: 4.9 (calc) (ref <5.0)
Triglycerides: 124 mg/dL (ref <150)

## 2018-04-04 LAB — VITAMIN B12: Vitamin B-12: 365 pg/mL (ref 200–1100)

## 2018-04-04 LAB — MAGNESIUM: Magnesium: 1.9 mg/dL (ref 1.5–2.5)

## 2018-04-04 LAB — HEMOGLOBIN A1C
Hgb A1c MFr Bld: 5.5 % of total Hgb (ref <5.7)
Mean Plasma Glucose: 111 (calc)
eAG (mmol/L): 6.2 (calc)

## 2018-04-04 LAB — VITAMIN D 25 HYDROXY (VIT D DEFICIENCY, FRACTURES): Vit D, 25-Hydroxy: 60 ng/mL (ref 30–100)

## 2018-04-04 LAB — TESTOSTERONE: Testosterone: 986 ng/dL — ABNORMAL HIGH (ref 250–827)

## 2018-04-04 LAB — INSULIN, RANDOM: Insulin: 10 u[IU]/mL (ref 2.0–19.6)

## 2018-04-04 LAB — TSH: TSH: 2.27 mIU/L (ref 0.40–4.50)

## 2018-04-04 LAB — PSA: PSA: 0.6 ng/mL (ref <=4.0)

## 2018-04-04 MED ORDER — ROSUVASTATIN CALCIUM 40 MG PO TABS: ORAL_TABLET | ORAL | 5 refills | Status: DC

## 2018-04-08 ENCOUNTER — Encounter: Payer: Self-pay | Admitting: Internal Medicine

## 2018-06-01 ENCOUNTER — Other Ambulatory Visit: Payer: Self-pay | Admitting: Internal Medicine

## 2018-06-01 DIAGNOSIS — I10 Essential (primary) hypertension: Secondary | ICD-10-CM

## 2018-07-09 ENCOUNTER — Ambulatory Visit: Payer: Self-pay | Admitting: Adult Health

## 2018-08-13 ENCOUNTER — Other Ambulatory Visit: Payer: Self-pay | Admitting: Internal Medicine

## 2018-08-14 ENCOUNTER — Other Ambulatory Visit: Payer: Managed Care, Other (non HMO) | Admitting: Internal Medicine

## 2018-08-14 ENCOUNTER — Other Ambulatory Visit: Payer: Self-pay | Admitting: Internal Medicine

## 2018-08-14 DIAGNOSIS — H00022 Hordeolum internum right lower eyelid: Secondary | ICD-10-CM

## 2018-08-14 MED ORDER — NEOMYCIN-POLYMYXIN-DEXAMETH 0.1 % OP OINT: 1.0000 "application " | TOPICAL_OINTMENT | Freq: Three times a day (TID) | OPHTHALMIC | 1 refills | Status: DC

## 2018-08-14 MED ORDER — DOXYCYCLINE HYCLATE 100 MG PO CAPS: ORAL_CAPSULE | ORAL | 0 refills | Status: DC

## 2018-08-14 NOTE — Progress Notes (Signed)
THIS ENCOUNTER IS A VIRTUAL VISIT DUE TO COVID-19 - PATIENT WAS NOT SEEN IN THE OFFICE.  PATIENT HAS CONSENTED TO VIRTUAL VISIT / TELEMEDICINE VISIT   Virtual Visit via telephone Note     I connected with  Nicholas Crosby   on 08/14/2018  by telephone.     I verified that I am speaking with the correct person using two identifiers.       I discussed the limitations of evaluation and management by telemedicine and the availability of in person appointments. The patient expressed understanding and agreed to proceed.  History of Present Illness:      This very nice 53 y.o. [name] with  HTN, HLD,  Prediabetes and Vitamin D Deficiency contacted via MyChart with a picture of his Rt eye which shows an internal hordeolum of the mid lower lid. Tried warm compresses w/o significant change.  Medications  Current Outpatient Medications (Endocrine & Metabolic):  .  testosterone cypionate (DEPOTESTOSTERONE CYPIONATE) 200 MG/ML injection, Inject 2cc IM q 2 weeks and needs 3 cc syringes &  1" 21 G needles  Current Outpatient Medications (Cardiovascular):  .  atenolol (TENORMIN) 100 MG tablet, TAKE 1 TABLET BY MOUTH DAILY .  doxazosin (CARDURA) 4 MG tablet, Take 1 tablet at Bedtime for BP & Prostate (Patient taking differently: Take 1/4 tablet at Bedtime for BP & Prostate) .  enalapril (VASOTEC) 20 MG tablet, TAKE 1 TABLET BY MOUTH DAILY .  hydrochlorothiazide (HYDRODIURIL) 25 MG tablet, Take 1 tablet Daily for BP & Fluid .  minoxidil (LONITEN) 10 MG tablet, TAKE 1/2 TO 1 TABLET BY MOUTH EVERY DAY FOR BLOOD PRESSURE .  rosuvastatin (CRESTOR) 40 MG tablet, Take 1/2 to 1 tablet daily or as directed for Cholesterol .  verapamil (CALAN-SR) 240 MG CR tablet, TAKE 1 TABLET BY MOUTH DAILY WITH FOOD FOR BLOOD PRESSURE   Current Outpatient Medications (Analgesics):  .  aspirin 81 MG tablet, Take 81 mg by mouth daily.   Current Outpatient Medications (Other):  Marland Kitchen  Cholecalciferol (VITAMIN D3) 5000 units CAPS,  TAKE 1 CAPSULE BY MOUTH DAILY .  cyclobenzaprine (FLEXERIL) 10 MG tablet, Take 1/2 to 1 tablet 2 to 3 x /day as needed for muscle spasm .  finasteride (PROSCAR) 5 MG tablet, TAKE 1 TABLET BY MOUTH DAILY FOR PROSTATE .  fluorouracil (EFUDEX) 5 % cream, Apply to suspect areas 2 x / day til skin raw (Patient taking differently: as needed. Apply to suspect areas 2 x / day til skin raw)  Problem list He has Hypertension; Hyperlipidemia; Testosterone deficiency; Vitamin D deficiency; Depression, major, in remission ([place]); Prediabetes; Morbid obesity (BMI 37.55); Medication management; Paroxysmal A-fib ([place]); Abdominal aortic aneurysm ([place]); and Prostatism on their problem list.   Observations/Objective:  General : Well sounding patient in no apparent distress HEENT: no hoarseness, no cough for duration of visit Lungs: speaks in complete sentences, no audible wheezing, no apparent distress Neurological: alert, oriented x 3 Psychiatric: pleasant, judgement appropriate   Assessment and Plan:  1. Hordeolum internum of right lower eyelid  - doxycycline (VIBRAMYCIN) 100 MG capsule; Take 1 tab 2 x /Day with food for Stye (H00.022)  Dispense: 30 capsule; Refill: 0  - neomycin-polymyxin-dexameth (MAXITROL) 0.1 % OINT; Place 1 application into the right eye 3 (three) times daily.  Dispense: 3.5 g; Refill: 1  - Continue warm moist compresses  Follow Up Instructions:     I discussed the assessment and treatment plan with the patient. The patient was provided an  opportunity to ask questions and all were answered. The patient agreed with the plan and demonstrated an understanding of the instructions.      The patient was advised to call back or seek an in-person evaluation if the symptoms worsen or if the condition fails to improve as anticipated.  I provided 16 minutes of non-face-to-face time during this encounter.   Kirtland Bouchard, MD

## 2018-10-15 ENCOUNTER — Encounter: Payer: Self-pay | Admitting: Internal Medicine

## 2018-10-15 NOTE — Progress Notes (Signed)
History of Present Illness:      This very nice 53 y.o. [name] presents for 6 month follow up with HTN, HLD, Pre-Diabetes and Vitamin D Deficiency.       Patient is treated for HTN (79 - age 49 yo) & BP has been controlled at home. Today's BP is at goal - 130/86. Patient has had no complaints of any cardiac type chest pain, palpitations, dyspnea / orthopnea / PND, dizziness, claudication, or dependent edema.      Hyperlipidemia is not controlled with diet & meds. Patient denies myalgias or other med SE's. Last Lipids were not at goal: Lab Results  Component Value Date   CHOL 201 (H) 04/03/2018   HDL 41 04/03/2018   LDLCALC 135 (H) 04/03/2018   TRIG 124 04/03/2018   CHOLHDL 4.9 04/03/2018          Also, the patient has history of PreDiabetes (A1c 6.4% / 2012 & 5.8% / 2014)  and has had no symptoms of reactive hypoglycemia, diabetic polys, paresthesias or visual blurring.  Last A1c was Normal & at goal:  Lab Results  Component Value Date   HGBA1C 5.5 04/03/2018       Patient has hx/o Testosterone Deficiency  Since 2012 and has been on Depo-Testosterone with improved stamina, libido & sense of well being.      Further, the patient also has history of Vitamin D Deficiency ("21" / 2008)  and supplements vitamin D without any suspected side-effects. Last vitamin D was at goal: Lab Results  Component Value Date   VD25OH 60 04/03/2018   Current Outpatient Medications on File Prior to Visit  Medication Sig  . aspirin 81 MG tablet Take 81 mg by mouth daily.  Marland Kitchen atenolol (TENORMIN) 100 MG tablet TAKE 1 TABLET BY MOUTH DAILY  . Cholecalciferol (VITAMIN D3) 5000 units CAPS TAKE 1 CAPSULE BY MOUTH DAILY (Patient taking differently: 2 (two) times a day. )  . cyclobenzaprine (FLEXERIL) 10 MG tablet Take 1/2 to 1 tablet 2 to 3 x /day as needed for muscle spasm  . doxazosin (CARDURA) 4 MG tablet Take 1 tablet at Bedtime for BP & Prostate (Patient taking differently: Take 1/4 tablet at Bedtime for  BP & Prostate)  . enalapril (VASOTEC) 20 MG tablet TAKE 1 TABLET BY MOUTH DAILY  . fluorouracil (EFUDEX) 5 % cream Apply to suspect areas 2 x / day til skin raw (Patient taking differently: as needed. Apply to suspect areas 2 x / day til skin raw)  . hydrochlorothiazide (HYDRODIURIL) 25 MG tablet Take 1 tablet Daily for BP & Fluid  . minoxidil (LONITEN) 10 MG tablet TAKE 1/2 TO 1 TABLET BY MOUTH EVERY DAY FOR BLOOD PRESSURE  . neomycin-polymyxin-dexameth (MAXITROL) 0.1 % OINT Place 1 application into the right eye 3 (three) times daily.  . rosuvastatin (CRESTOR) 40 MG tablet Take 1/2 to 1 tablet daily or as directed for Cholesterol  . testosterone cypionate (DEPOTESTOSTERONE CYPIONATE) 200 MG/ML injection Inject 2cc IM q 2 weeks and needs 3 cc syringes &  1" 21 G needles  . verapamil (CALAN-SR) 240 MG CR tablet TAKE 1 TABLET BY MOUTH DAILY WITH FOOD FOR BLOOD PRESSURE   No current facility-administered medications on file prior to visit.    Allergies  Allergen Reactions  . Zocor [Simvastatin] Other (See Comments)    cpk elev  . Crestor [Rosuvastatin] Palpitations   PMHx:   Past Medical History:  Diagnosis Date  . Depression   . Hyperlipidemia   .  Hypertension   . Prostate enlargement    Immunization History  Administered Date(s) Administered  . Influenza-Unspecified 01/17/2015, 01/16/2017, 01/16/2018  . Tdap 01/09/2007, 05/18/2015   Past Surgical History:  Procedure Laterality Date  . COSMETIC SURGERY     liposuction  . CYST REMOVAL NECK    . NO PAST SURGERIES    . VASECTOMY  04/06/2012   Procedure: VASECTOMY;  Surgeon: Alexis Frock, MD;  Location: [facility];  Service: Urology;  Laterality: Bilateral;   FHx:    Reviewed / unchanged  SHx:    Reviewed / unchanged   Systems Review:  Constitutional: Denies fever, chills, wt changes, headaches, insomnia, fatigue, night sweats, change in appetite. Eyes: Denies redness, blurred vision, diplopia, discharge,  itchy, watery eyes.  ENT: Denies discharge, congestion, post nasal drip, epistaxis, sore throat, earache, hearing loss, dental pain, tinnitus, vertigo, sinus pain, snoring.  CV: Denies chest pain, palpitations, irregular heartbeat, syncope, dyspnea, diaphoresis, orthopnea, PND, claudication or edema. Respiratory: denies cough, dyspnea, DOE, pleurisy, hoarseness, laryngitis, wheezing.  Gastrointestinal: Denies dysphagia, odynophagia, heartburn, reflux, water brash, abdominal pain or cramps, nausea, vomiting, bloating, diarrhea, constipation, hematemesis, melena, hematochezia  or hemorrhoids. Genitourinary: Denies dysuria, frequency, urgency, nocturia, hesitancy, discharge, hematuria or flank pain. Musculoskeletal: Denies arthralgias, myalgias, stiffness, jt. swelling, pain, limping or strain/sprain.  Skin: Denies pruritus, rash, hives, warts, acne, eczema or change in skin lesion(s). Neuro: No weakness, tremor, incoordination, spasms, paresthesia or pain. Psychiatric: Denies confusion, memory loss or sensory loss. Endo: Denies change in weight, skin or hair change.  Heme/Lymph: No excessive bleeding, bruising or enlarged lymph nodes.  Physical Exam  BP 130/86   Pulse 60   Temp (!) 97 F (36.1 C)   Resp 16   Ht 6' (1.829 m)   Wt 277 lb 6.4 oz (125.8 kg)   BMI 37.62 kg/m   Appears  well nourished, well groomed  and in no distress.  Eyes: PERRLA, EOMs, conjunctiva no swelling or erythema. Sinuses: No frontal/maxillary tenderness ENT/Mouth: EAC's clear, TM's nl w/o erythema, bulging. Nares clear w/o erythema, swelling, exudates. Oropharynx clear without erythema or exudates. Oral hygiene is good. Tongue normal, non obstructing. Hearing intact.  Neck: Supple. Thyroid not palpable. Car 2+/2+ without bruits, nodes or JVD. Chest: Respirations nl with BS clear & equal w/o rales, rhonchi, wheezing or stridor.  Cor: Heart sounds normal w/ regular rate and rhythm without sig. murmurs, gallops,  clicks or rubs. Peripheral pulses normal and equal  without edema.  Abdomen: Soft & bowel sounds normal. Non-tender w/o guarding, rebound, hernias, masses or organomegaly.  Lymphatics: Unremarkable.  Musculoskeletal: Full ROM all peripheral extremities, joint stability, 5/5 strength and normal gait.  Skin: Warm, dry without exposed rashes, lesions or ecchymosis apparent.  Neuro: Cranial nerves intact, reflexes equal bilaterally. Sensory-motor testing grossly intact. Tendon reflexes grossly intact.  Pysch: Alert & oriented x 3.  Insight and judgement nl & appropriate. No ideations.  Assessment and Plan:  1. Essential hypertension  - Continue medication, monitor blood pressure at home.  - Continue DASH diet.  Reminder to go to the ER if any CP,  SOB, nausea, dizziness, severe HA, changes vision/speech.  - CBC with Differential/Platelet - COMPLETE METABOLIC PANEL WITH GFR - Magnesium - TSH  2. Hyperlipidemia, mixed  - Continue diet/meds, exercise,& lifestyle modifications.  - Continue monitor periodic cholesterol/liver & renal functions   - Lipid panel - TSH  3. Abnormal glucose  - VIT - Continue diet, exercise  - Lifestyle modifications.  - Monitor  appropriate labs.  - Insulin, random - Fructosamine  4. Vitamin D deficiency  - Continue supplementation. AMIN D 25 Hydroxyl  5. Prediabetes  - Insulin, random - Fructosamine  6. Testosterone deficiency  - Testosterone  7. Medication management  - CBC with Differential/Platelet - COMPLETE METABOLIC PANEL WITH GFR - Magnesium - Lipid panel - TSH - Insulin, random - Fructosamine - VITAMIN D 25 Hydroxyl - Testosterone     Discussed  regular exercise, BP monitoring, weight control to achieve/maintain BMI less than 25 and discussed med and SE's. Recommended labs to assess and monitor clinical status with further disposition pending results of labs.  I discussed the assessment and treatment plan with the patient. The  patient was provided an opportunity to ask questions and all were answered. The patient agreed with the plan and demonstrated an understanding of the instructions. I provided over 30 minutes of exam, counseling, chart review and  complex critical decision making.   Kirtland Bouchard, MD

## 2018-10-16 ENCOUNTER — Encounter: Payer: Self-pay | Admitting: Internal Medicine

## 2018-10-16 ENCOUNTER — Other Ambulatory Visit: Payer: Self-pay

## 2018-10-16 ENCOUNTER — Ambulatory Visit: Payer: Managed Care, Other (non HMO) | Admitting: Internal Medicine

## 2018-10-16 VITALS — BP 130/86 | HR 60 | Temp 97.0°F | Resp 16 | Ht 72.0 in | Wt 277.4 lb

## 2018-10-16 DIAGNOSIS — E349 Endocrine disorder, unspecified: Secondary | ICD-10-CM

## 2018-10-16 DIAGNOSIS — I10 Essential (primary) hypertension: Secondary | ICD-10-CM | POA: Diagnosis not present

## 2018-10-16 DIAGNOSIS — R7309 Other abnormal glucose: Secondary | ICD-10-CM

## 2018-10-16 DIAGNOSIS — Z79899 Other long term (current) drug therapy: Secondary | ICD-10-CM

## 2018-10-16 DIAGNOSIS — R7303 Prediabetes: Secondary | ICD-10-CM

## 2018-10-16 DIAGNOSIS — E559 Vitamin D deficiency, unspecified: Secondary | ICD-10-CM

## 2018-10-16 DIAGNOSIS — E782 Mixed hyperlipidemia: Secondary | ICD-10-CM

## 2018-10-16 NOTE — Patient Instructions (Signed)

## 2018-10-21 LAB — CBC WITH DIFFERENTIAL/PLATELET
Absolute Monocytes: 549 cells/uL (ref 200–950)
Basophils Absolute: 47 cells/uL (ref 0–200)
Basophils Relative: 0.7 %
Eosinophils Absolute: 67 cells/uL (ref 15–500)
Eosinophils Relative: 1 %
HCT: 42.8 % (ref 38.5–50.0)
Hemoglobin: 14.5 g/dL (ref 13.2–17.1)
Lymphs Abs: 1715 cells/uL (ref 850–3900)
MCH: 28 pg (ref 27.0–33.0)
MCHC: 33.9 g/dL (ref 32.0–36.0)
MCV: 82.8 fL (ref 80.0–100.0)
MPV: 11 fL (ref 7.5–12.5)
Monocytes Relative: 8.2 %
Neutro Abs: 4322 cells/uL (ref 1500–7800)
Neutrophils Relative %: 64.5 %
Platelets: 291 10*3/uL (ref 140–400)
RBC: 5.17 10*6/uL (ref 4.20–5.80)
RDW: 14.1 % (ref 11.0–15.0)
Total Lymphocyte: 25.6 %
WBC: 6.7 10*3/uL (ref 3.8–10.8)

## 2018-10-21 LAB — COMPLETE METABOLIC PANEL WITH GFR
AG Ratio: 1.6 (calc) (ref 1.0–2.5)
ALT: 24 U/L (ref 9–46)
AST: 31 U/L (ref 10–35)
Albumin: 4.2 g/dL (ref 3.6–5.1)
Alkaline phosphatase (APISO): 29 U/L — ABNORMAL LOW (ref 35–144)
BUN: 14 mg/dL (ref 7–25)
CO2: 30 mmol/L (ref 20–32)
Calcium: 9.5 mg/dL (ref 8.6–10.3)
Chloride: 102 mmol/L (ref 98–110)
Creat: 0.95 mg/dL (ref 0.70–1.33)
GFR, Est African American: 107 mL/min/{1.73_m2} (ref >=60)
GFR, Est Non African American: 92 mL/min/{1.73_m2} (ref >=60)
Globulin: 2.7 g/dL (calc) (ref 1.9–3.7)
Glucose, Bld: 101 mg/dL — ABNORMAL HIGH (ref 65–99)
Potassium: 3.8 mmol/L (ref 3.5–5.3)
Sodium: 139 mmol/L (ref 135–146)
Total Bilirubin: 0.7 mg/dL (ref 0.2–1.2)
Total Protein: 6.9 g/dL (ref 6.1–8.1)

## 2018-10-21 LAB — VITAMIN D 25 HYDROXY (VIT D DEFICIENCY, FRACTURES): Vit D, 25-Hydroxy: 71 ng/mL (ref 30–100)

## 2018-10-21 LAB — LIPID PANEL
Cholesterol: 178 mg/dL (ref <200)
HDL: 43 mg/dL (ref >=40)
LDL Cholesterol (Calc): 112 mg/dL (calc) — ABNORMAL HIGH
Non-HDL Cholesterol (Calc): 135 mg/dL (calc) — ABNORMAL HIGH (ref <130)
Total CHOL/HDL Ratio: 4.1 (calc) (ref <5.0)
Triglycerides: 118 mg/dL (ref <150)

## 2018-10-21 LAB — FRUCTOSAMINE: Fructosamine: 235 umol/L (ref 205–285)

## 2018-10-21 LAB — INSULIN, RANDOM: Insulin: 7.9 u[IU]/mL

## 2018-10-21 LAB — TSH: TSH: 1.41 mIU/L (ref 0.40–4.50)

## 2018-10-21 LAB — MAGNESIUM: Magnesium: 2 mg/dL (ref 1.5–2.5)

## 2018-10-21 LAB — TESTOSTERONE: Testosterone: 1270 ng/dL — ABNORMAL HIGH (ref 250–827)

## 2018-11-26 ENCOUNTER — Other Ambulatory Visit: Payer: Self-pay | Admitting: Internal Medicine

## 2018-12-18 ENCOUNTER — Other Ambulatory Visit: Payer: Self-pay | Admitting: Internal Medicine

## 2018-12-18 DIAGNOSIS — E349 Endocrine disorder, unspecified: Secondary | ICD-10-CM

## 2018-12-18 MED ORDER — TESTOSTERONE CYPIONATE 200 MG/ML IM SOLN: INTRAMUSCULAR | 2 refills | Status: DC

## 2019-01-21 NOTE — Progress Notes (Signed)
FOLLOW UP  Assessment and Plan:   Paroxysmal Atrial Fibrillation CHADSVASC2 of 1 - has declined anticoagulation other than ASA and referral to cardiology Rate controlled - reportedly only seems to have with rare excessive ETOH consumption.   Hypertension Controlled; continue medications Monitor blood pressure at home; patient to call if consistently greater than 130/80 Continue DASH diet.   Reminder to go to the ER if any CP, SOB, nausea, dizziness, severe HA, changes vision/speech, left arm numbness and tingling and jaw pain.  Cholesterol Currently remains mildly above goal of 100 Cut crestor to 20 mg last visit Continue low cholesterol diet and exercise.  Check lipid panel.   Abnormal glucose Continue diet and exercise.  Perform daily foot/skin check, notify office of any concerning changes.  Check A1C  Obesity with co morbidities Long discussion about weight loss, diet, and exercise Recommended diet heavy in fruits and veggies and low in animal meats, cheeses, and dairy products, appropriate calorie intake Discussed ideal weight for height  Patient will work on increasing exercise, continue with low carb diet - discussed need for high fiber Will follow up in 3 months  Vitamin D Def At goal at last visit; continue supplementation to maintain goal of 70-100 Defer Vit D level  Hypogonadism - continue to monitor, states medication is helping with symptoms of low T.    Continue diet and meds as discussed. Further disposition pending results of labs. Discussed med's effects and SE's.   Over 30 minutes of exam, counseling, chart review, and critical decision making was performed.   Future Appointments  Date Time Provider [place]  05/15/2019  3:00 PM Unk Pinto, MD [facility] None    ----------------------------------------------------------------------------------------------------------------------  HPI 53 y.o. male  presents for 3 month follow up on  hypertension, cholesterol, prediabetes, obesity and vitamin D deficiency.   He also has dx of paroxysmal a. Fib with CHADSVASC2 of 1 - risks have been discussed, he is on ASA, has declined anticoagulation after discussion of risks.   BMI is Body mass index is 37.97 kg/m., he has been working on diet and exercise. 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 He is watching diet for carbs and sugars. He has been doing some intermittent fasting.  Wt Readings from Last 3 Encounters:  01/23/19 280 lb (127 kg)  10/16/18 277 lb 6.4 oz (125.8 kg)  04/03/18 273 lb 9.6 oz (124.1 kg)   His blood pressure has not been controlled at home, today their BP is BP: 128/86  He does workout. He denies chest pain, shortness of breath, dizziness.   He is on cholesterol medication (rouvastatin 40 mg daily- in July started on 1/2 pill) and denies myalgias. His cholesterol is not at goal. The cholesterol last visit was:   Lab Results  Component Value Date   CHOL 178 10/16/2018   HDL 43 10/16/2018   LDLCALC 112 (H) 10/16/2018   TRIG 118 10/16/2018   CHOLHDL 4.1 10/16/2018    He has been working on diet and exercise for prediabetes, and denies increased appetite, nausea, paresthesia of the feet, polydipsia, polyuria, visual disturbances, vomiting and weight loss. Last A1C in the office was:  Lab Results  Component Value Date   HGBA1C 5.5 04/03/2018   Patient is on Vitamin D supplement:    Lab Results  Component Value Date   VD25OH 61 10/16/2018     He has a history of testosterone deficiency and is on  testosterone replacement, due for shot today, doing every 2 weeks. He states that the testosterone helps with his energy, libido, muscle mass. Lab Results  Component Value Date   TESTOSTERONE 1,270 (H) 10/16/2018     Current Medications:  Current Outpatient Medications on File Prior to Visit  Medication Sig  . aspirin 81 MG tablet Take 81 mg by mouth daily.   Marland Kitchen atenolol (TENORMIN) 100 MG tablet TAKE 1 TABLET BY MOUTH DAILY  . Cholecalciferol (VITAMIN D3) 5000 units CAPS TAKE 1 CAPSULE BY MOUTH DAILY (Patient taking differently: 2 (two) times a day. )  . doxazosin (CARDURA) 4 MG tablet Take 1 tablet at Bedtime for BP & Prostate (Patient taking differently: Take 1/4 tablet at Bedtime for BP & Prostate)  . enalapril (VASOTEC) 20 MG tablet TAKE 1 TABLET BY MOUTH DAILY  . fluorouracil (EFUDEX) 5 % cream Apply to suspect areas 2 x / day til skin raw (Patient taking differently: as needed. Apply to suspect areas 2 x / day til skin raw)  . hydrochlorothiazide (HYDRODIURIL) 25 MG tablet Take 1 tablet Daily for BP & Fluid  . minoxidil (LONITEN) 10 MG tablet Take 1/2 to 1 tablet Daily for BP  . rosuvastatin (CRESTOR) 40 MG tablet Take 1/2 to 1 tablet daily or as directed for Cholesterol  . testosterone cypionate (DEPOTESTOSTERONE CYPIONATE) 200 MG/ML injection Inject 2cc IM q 2 weeks and needs 3 cc syringes &  1" 21 G needles  . verapamil (CALAN-SR) 240 MG CR tablet TAKE 1 TABLET BY MOUTH DAILY WITH FOOD FOR BLOOD PRESSURE  . cyclobenzaprine (FLEXERIL) 10 MG tablet Take 1/2 to 1 tablet 2 to 3 x /day as needed for muscle spasm  . neomycin-polymyxin-dexameth (MAXITROL) 0.1 % OINT Place 1 application into the right eye 3 (three) times daily.   No current facility-administered medications on file prior to visit.      Allergies:  Allergies  Allergen Reactions  . Zocor [Simvastatin] Other (See Comments)    cpk elev  . Crestor [Rosuvastatin] Palpitations     Medical History:  Past Medical History:  Diagnosis Date  . Depression   . Hyperlipidemia   . Hypertension   . Prostate enlargement    Family history- Reviewed and unchanged Social history- Reviewed and unchanged   Review of Systems:  Review of Systems  Constitutional: Negative for malaise/fatigue and weight loss.  HENT: Negative for hearing loss and tinnitus.   Eyes: Negative for blurred  vision and double vision.  Respiratory: Negative for cough, shortness of breath and wheezing.   Cardiovascular: Negative for chest pain, palpitations, orthopnea, claudication and leg swelling.  Gastrointestinal: Negative for abdominal pain, blood in stool, constipation, diarrhea, heartburn, melena, nausea and vomiting.  Genitourinary: Positive for frequency. Negative for dysuria, flank pain, hematuria and urgency.  Musculoskeletal: Negative for joint pain and myalgias.  Skin: Negative for rash.  Neurological: Negative for dizziness, tingling, sensory change, weakness and headaches.  Endo/Heme/Allergies: Negative for polydipsia.  Psychiatric/Behavioral: Negative.   All other systems reviewed and are negative.   Physical Exam: BP 128/86   Pulse 76   Temp 98.6 F (37 C)   Wt 280 lb (127 kg)   SpO2 97%   BMI 37.97 kg/m  Wt Readings from Last 3 Encounters:  01/23/19 280 lb (127 kg)  10/16/18 277 lb 6.4 oz (125.8 kg)  04/03/18 273 lb 9.6 oz (124.1 kg)   General Appearance: Well nourished, in no apparent distress. Eyes: PERRLA, EOMs, conjunctiva no swelling or erythema Sinuses:  No Frontal/maxillary tenderness ENT/Mouth: Ext aud canals clear, TMs without erythema, bulging. No erythema, swelling, or exudate on post pharynx.  Tonsils not swollen or erythematous. Hearing normal.  Neck: Supple, thyroid normal.  Respiratory: Respiratory effort normal, BS equal bilaterally without rales, rhonchi, wheezing or stridor.  Cardio: RRR with no MRGs. Brisk peripheral pulses without edema.  Abdomen: Soft, + BS.  Non tender, no guarding, rebound, hernias, masses. Lymphatics: Non tender without lymphadenopathy.  Musculoskeletal: Full ROM, 5/5 strength, Normal gait Skin: Warm, dry without rashes, lesions, ecchymosis.  Neuro: Cranial nerves intact. No cerebellar symptoms.  Psych: Awake and oriented X 3, normal affect, Insight and Judgment appropriate.    Vicie Mutters, PA-C 11:04 AM [facility]  [facility] Adoles[facility]

## 2019-01-23 ENCOUNTER — Other Ambulatory Visit: Payer: Self-pay

## 2019-01-23 ENCOUNTER — Encounter: Payer: Self-pay | Admitting: Physician Assistant

## 2019-01-23 ENCOUNTER — Ambulatory Visit: Payer: Managed Care, Other (non HMO) | Admitting: Physician Assistant

## 2019-01-23 VITALS — BP 128/86 | HR 76 | Temp 98.6°F | Ht 72.0 in | Wt 280.0 lb

## 2019-01-23 DIAGNOSIS — E291 Testicular hypofunction: Secondary | ICD-10-CM

## 2019-01-23 DIAGNOSIS — E559 Vitamin D deficiency, unspecified: Secondary | ICD-10-CM

## 2019-01-23 DIAGNOSIS — I714 Abdominal aortic aneurysm, without rupture, unspecified: Secondary | ICD-10-CM

## 2019-01-23 DIAGNOSIS — Z79899 Other long term (current) drug therapy: Secondary | ICD-10-CM | POA: Diagnosis not present

## 2019-01-23 DIAGNOSIS — I10 Essential (primary) hypertension: Secondary | ICD-10-CM | POA: Diagnosis not present

## 2019-01-23 DIAGNOSIS — I480 Paroxysmal atrial fibrillation: Secondary | ICD-10-CM

## 2019-01-23 DIAGNOSIS — N401 Enlarged prostate with lower urinary tract symptoms: Secondary | ICD-10-CM

## 2019-01-23 DIAGNOSIS — R7309 Other abnormal glucose: Secondary | ICD-10-CM

## 2019-01-23 DIAGNOSIS — F325 Major depressive disorder, single episode, in full remission: Secondary | ICD-10-CM | POA: Diagnosis not present

## 2019-01-23 DIAGNOSIS — E7800 Pure hypercholesterolemia, unspecified: Secondary | ICD-10-CM | POA: Diagnosis not present

## 2019-01-23 DIAGNOSIS — N320 Bladder-neck obstruction: Secondary | ICD-10-CM

## 2019-01-23 MED ORDER — FINASTERIDE 5 MG PO TABS: ORAL_TABLET | ORAL | 3 refills | Status: AC

## 2019-01-23 MED ORDER — TESTOSTERONE CYPIONATE 200 MG/ML IM SOLN
200.0000 mg | INTRAMUSCULAR | Status: DC
  Administered 2019-01-23: 200 mg via INTRAMUSCULAR

## 2019-01-24 LAB — COMPLETE METABOLIC PANEL WITH GFR
AG Ratio: 1.6 (calc) (ref 1.0–2.5)
ALT: 19 U/L (ref 9–46)
AST: 15 U/L (ref 10–35)
Albumin: 4.2 g/dL (ref 3.6–5.1)
Alkaline phosphatase (APISO): 35 U/L (ref 35–144)
BUN: 15 mg/dL (ref 7–25)
CO2: 29 mmol/L (ref 20–32)
Calcium: 9.3 mg/dL (ref 8.6–10.3)
Chloride: 101 mmol/L (ref 98–110)
Creat: 0.94 mg/dL (ref 0.70–1.33)
GFR, Est African American: 108 mL/min/{1.73_m2} (ref >=60)
GFR, Est Non African American: 93 mL/min/{1.73_m2} (ref >=60)
Globulin: 2.6 g/dL (calc) (ref 1.9–3.7)
Glucose, Bld: 96 mg/dL (ref 65–99)
Potassium: 4.2 mmol/L (ref 3.5–5.3)
Sodium: 137 mmol/L (ref 135–146)
Total Bilirubin: 0.6 mg/dL (ref 0.2–1.2)
Total Protein: 6.8 g/dL (ref 6.1–8.1)

## 2019-01-24 LAB — CBC WITH DIFFERENTIAL/PLATELET
Absolute Monocytes: 593 cells/uL (ref 200–950)
Basophils Absolute: 51 cells/uL (ref 0–200)
Basophils Relative: 0.9 %
Eosinophils Absolute: 63 cells/uL (ref 15–500)
Eosinophils Relative: 1.1 %
HCT: 42.9 % (ref 38.5–50.0)
Hemoglobin: 14.2 g/dL (ref 13.2–17.1)
Lymphs Abs: 1402 cells/uL (ref 850–3900)
MCH: 27.3 pg (ref 27.0–33.0)
MCHC: 33.1 g/dL (ref 32.0–36.0)
MCV: 82.3 fL (ref 80.0–100.0)
MPV: 11.2 fL (ref 7.5–12.5)
Monocytes Relative: 10.4 %
Neutro Abs: 3591 cells/uL (ref 1500–7800)
Neutrophils Relative %: 63 %
Platelets: 270 10*3/uL (ref 140–400)
RBC: 5.21 10*6/uL (ref 4.20–5.80)
RDW: 14 % (ref 11.0–15.0)
Total Lymphocyte: 24.6 %
WBC: 5.7 10*3/uL (ref 3.8–10.8)

## 2019-01-24 LAB — VITAMIN D 25 HYDROXY (VIT D DEFICIENCY, FRACTURES): Vit D, 25-Hydroxy: 71 ng/mL (ref 30–100)

## 2019-01-24 LAB — MAGNESIUM: Magnesium: 2 mg/dL (ref 1.5–2.5)

## 2019-01-24 LAB — LIPID PANEL
Cholesterol: 190 mg/dL (ref <200)
HDL: 43 mg/dL (ref >=40)
LDL Cholesterol (Calc): 122 mg/dL (calc) — ABNORMAL HIGH
Non-HDL Cholesterol (Calc): 147 mg/dL (calc) — ABNORMAL HIGH (ref <130)
Total CHOL/HDL Ratio: 4.4 (calc) (ref <5.0)
Triglycerides: 131 mg/dL (ref <150)

## 2019-01-24 LAB — TESTOSTERONE: Testosterone: 266 ng/dL (ref 250–827)

## 2019-01-24 LAB — HEMOGLOBIN A1C
Hgb A1c MFr Bld: 5.6 % of total Hgb (ref <5.7)
Mean Plasma Glucose: 114 (calc)
eAG (mmol/L): 6.3 (calc)

## 2019-01-24 LAB — TSH: TSH: 1.18 mIU/L (ref 0.40–4.50)

## 2019-01-31 ENCOUNTER — Other Ambulatory Visit: Payer: Self-pay

## 2019-01-31 ENCOUNTER — Ambulatory Visit: Payer: Managed Care, Other (non HMO) | Admitting: Adult Health

## 2019-01-31 ENCOUNTER — Encounter: Payer: Self-pay | Admitting: Adult Health

## 2019-01-31 VITALS — BP 136/98 | HR 66 | Temp 97.0°F | Ht 72.0 in | Wt 284.0 lb

## 2019-01-31 DIAGNOSIS — M545 Low back pain, unspecified: Secondary | ICD-10-CM

## 2019-01-31 MED ORDER — PREDNISONE 20 MG PO TABS: ORAL_TABLET | ORAL | 0 refills | Status: DC

## 2019-01-31 NOTE — Progress Notes (Signed)
Assessment and Plan:  Nicholas Crosby was seen today for back pain.  Diagnoses and all orders for this visit:  Acute right-sided low back pain without sciatica - negative straight leg Prednisone was prescribed,NSAIDs, RICE, and exercise given If not better follow up in office or will refer to PT/orthopedics. Natural history and expected course discussed. Questions answered. Neurosurgeon distributed. Proper lifting, bending technique discussed. Short (2-4 day) period of relative rest recommended until acute symptoms improve. OTC analgesics as needed. Muscle relaxants per medication orders. - flexeril 5-10 mg TID PRN, has leftover from previous Follow up if not resolving in another 2 weeks, will order xray Consider PT if recurrent or increasingly frequent flares -     predniSONE (DELTASONE) 20 MG tablet; 2 tablets daily for 3 days, 1 tablet daily for 4 days.  Further disposition pending results of labs. Discussed med's effects and SE's.   Over 30 minutes of exam, counseling, chart review, and critical decision making was performed.   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 He reports not notable event prior to onset, started gradually while at the beach on vacation 6 days ago. He reports gradual onset.   He reports feels like spasms, intermittent, but with occasional sensation of extending down into RLE without numbness/tingling/weakness/falls. Pain 3/10 at this time, "spasming", but worse with certain  positions/sitting and shoots up, sensations of "catching." Worse with long periods of standing or sitting, with R flexion. He has been taking leftover flexeril and ibuprofen 800 mg once or twice daily which has helped but doesn't seem to be resolving as typically would. He denies midline pain, fever/chills, fatigue, unintended weight loss, loss of bowel or bladder control.   Ibuprofen 800 mg once or twice a day   On review he had MRI lumbar on 10/13/2009 which showed:  Minimal bulging of the discs at L2-3, L3-4 and L4-5 but without herniation or apparent neural compression. Mild facet degeneration at L4-5. Chronic disc degeneration at L5-S1 the central disc herniation that abuts both S1 nerve roots but does not compress them.  Nerve root irritation could occur on either side. Mild facet degeneration at L5-S1.   Past Medical History:  Diagnosis Date  . Depression   . Hyperlipidemia   . Hypertension   . Prostate enlargement      Allergies  Allergen Reactions  . Zocor [Simvastatin] Other (See Comments)    cpk elev  . Crestor [Rosuvastatin] Palpitations    Current Outpatient Medications on File Prior to Visit  Medication Sig  . aspirin 81 MG tablet Take 81 mg by mouth daily.  Marland Kitchen atenolol (TENORMIN) 100 MG tablet TAKE 1 TABLET BY MOUTH DAILY  . Cholecalciferol (VITAMIN D3) 5000 units CAPS TAKE 1 CAPSULE BY MOUTH DAILY (Patient taking differently: 2 (two) times a day. )  . doxazosin (CARDURA) 4 MG tablet Take 1 tablet at Bedtime for BP & Prostate (Patient taking differently: Take 1/4 tablet at Bedtime for BP & Prostate)  . enalapril (VASOTEC) 20 MG tablet TAKE 1 TABLET  BY MOUTH DAILY  . finasteride (PROSCAR) 5 MG tablet Take 1 tablet daily for Prostate  . fluorouracil (EFUDEX) 5 % cream Apply to suspect areas 2 x / day til skin raw (Patient taking differently: as needed. Apply to suspect areas 2 x / day til skin raw)  . minoxidil (LONITEN) 10 MG tablet Take 1/2 to 1 tablet Daily for BP  .  rosuvastatin (CRESTOR) 40 MG tablet Take 1/2 to 1 tablet daily or as directed for Cholesterol  . testosterone cypionate (DEPOTESTOSTERONE CYPIONATE) 200 MG/ML injection Inject 2cc IM q 2 weeks and needs 3 cc syringes &  1" 21 G needles  . verapamil (CALAN-SR) 240 MG CR tablet TAKE 1 TABLET BY MOUTH DAILY WITH FOOD FOR BLOOD PRESSURE  . cyclobenzaprine (FLEXERIL) 10 MG tablet Take 1/2 to 1 tablet 2 to 3 x /day as needed for muscle spasm  . hydrochlorothiazide (HYDRODIURIL) 25 MG tablet Take 1 tablet Daily for BP & Fluid  . neomycin-polymyxin-dexameth (MAXITROL) 0.1 % OINT Place 1 application into the right eye 3 (three) times daily.   Current Facility-Administered Medications on File Prior to Visit  Medication  . testosterone cypionate (DEPOTESTOSTERONE CYPIONATE) injection 200 mg    ROS: all negative except above.   Physical Exam:  BP (!) 136/98   Pulse 66   Temp (!) 97 F (36.1 C)   Ht 6' (1.829 m)   Wt 284 lb (128.8 kg)   SpO2 98%   BMI 38.52 kg/m   General Appearance: Well nourished, obese male in no apparent distress. Eyes: conjunctiva no swelling or erythema ENT/Mouth: Hearing normal.  Neck: Supple Respiratory: Respiratory effort normal, BS equal bilaterally without rales, rhonchi, wheezing or stridor.  Cardio: RRR with no MRGs. Brisk peripheral pulses without edema.  Abdomen: Soft, + BS.  Non tender, no guarding, rebound, hernias, masses. Lymphatics: Non tender without lymphadenopathy.  Musculoskeletal: Patient is able to ambulate well. Gait is not  Antalgic. Straight leg raising with dorsiflexion negative bilaterally for radicular symptoms. Sensory exam in the legs are normal. Knee reflexes are normal Ankle reflexes are normal Strength is normal and symmetric in arms and legs. There is not SI tenderness to palpation.  There is paraspinal muscle spasm.  There is not midline tenderness.  ROM of spine with  limited in R sided lateral flexion due to pain.  Skin: Warm, dry  without rashes, lesions, ecchymosis.  Neuro: Normal muscle tone, Sensation intact.  Psych: Awake and oriented X 3, normal affect, Insight and Judgment appropriate.     Izora Ribas, NP 2:01 PM [facility]

## 2019-01-31 NOTE — Patient Instructions (Signed)
Acute Back Pain, Adult Acute back pain is sudden and usually short-lived. It is often caused by an injury to the muscles and tissues in the back. The injury may result from:  A muscle or ligament getting overstretched or torn (strained). Ligaments are tissues that connect bones to each other. Lifting something improperly can cause a back strain.  Wear and tear (degeneration) of the spinal disks. Spinal disks are circular tissue that provides cushioning between the bones of the spine (vertebrae).  Twisting motions, such as while playing sports or doing yard work.  A hit to the back.  Arthritis. You may have a physical exam, lab tests, and imaging tests to find the cause of your pain. Acute back pain usually goes away with rest and home care. Follow these instructions at home: Managing pain, stiffness, and swelling  Take over-the-counter and prescription medicines only as told by your health care provider.  Your health care provider may recommend applying ice during the first 24-48 hours after your pain starts. To do this: ? Put ice in a plastic bag. ? Place a towel between your skin and the bag. ? Leave the ice on for 20 minutes, 2-3 times a day.  If directed, apply heat to the affected area as often as told by your health care provider. Use the heat source that your health care provider recommends, such as a moist heat pack or a heating pad. ? Place a towel between your skin and the heat source. ? Leave the heat on for 20-30 minutes. ? Remove the heat if your skin turns bright red. This is especially important if you are unable to feel pain, heat, or cold. You have a greater risk of getting burned. Activity   Do not stay in bed. Staying in bed for more than 1-2 days can delay your recovery.  Sit up and stand up straight. Avoid leaning forward when you sit, or hunching over when you stand. ? If you work at a desk, sit close to it so you do not need to lean over. Keep your chin tucked  in. Keep your neck drawn back, and keep your elbows bent at a right angle. Your arms should look like the letter "L." ? Sit high and close to the steering wheel when you drive. Add lower back (lumbar) support to your car seat, if needed.  Take short walks on even surfaces as soon as you are able. Try to increase the length of time you walk each day.  Do not sit, drive, or stand in one place for more than 30 minutes at a time. Sitting or standing for long periods of time can put stress on your back.  Do not drive or use heavy machinery while taking prescription pain medicine.  Use proper lifting techniques. When you bend and lift, use positions that put less stress on your back: ? Bend your knees. ? Keep the load close to your body. ? Avoid twisting.  Exercise regularly as told by your health care provider. Exercising helps your back heal faster and helps prevent back injuries by keeping muscles strong and flexible.  Work with a physical therapist to make a safe exercise program, as recommended by your health care provider. Do any exercises as told by your physical therapist. Lifestyle  Maintain a healthy weight. Extra weight puts stress on your back and makes it difficult to have good posture.  Avoid activities or situations that make you feel anxious or stressed. Stress and anxiety increase muscle   tension and can make back pain worse. Learn ways to manage anxiety and stress, such as through exercise. General instructions  Sleep on a firm mattress in a comfortable position. Try lying on your side with your knees slightly bent. If you lie on your back, put a pillow under your knees.  Follow your treatment plan as told by your health care provider. This may include: ? Cognitive or behavioral therapy. ? Acupuncture or massage therapy. ? Meditation or yoga. Contact a health care provider if:  You have pain that is not relieved with rest or medicine.  You have increasing pain going down  into your legs or buttocks.  Your pain does not improve after 2 weeks.  You have pain at night.  You lose weight without trying.  You have a fever or chills. Get help right away if:  You develop new bowel or bladder control problems.  You have unusual weakness or numbness in your arms or legs.  You develop nausea or vomiting.  You develop abdominal pain.  You feel faint. Summary  Acute back pain is sudden and usually short-lived.  Use proper lifting techniques. When you bend and lift, use positions that put less stress on your back.  Take over-the-counter and prescription medicines and apply heat or ice as directed by your health care provider. This information is not intended to replace advice given to you by your health care provider. Make sure you discuss any questions you have with your health care provider. Document Released: 04/04/2005 Document Revised: 07/24/2018 Document Reviewed: 11/16/2016 Elsevier Patient Education  2020 [employer]     Back Exercises The following exercises strengthen the muscles that help to support the trunk and back. They also help to keep the lower back flexible. Doing these exercises can help to prevent back pain or lessen existing pain.  If you have back pain or discomfort, try doing these exercises 2-3 times each day or as told by your health care provider.  As your pain improves, do them once each day, but increase the number of times that you repeat the steps for each exercise (do more repetitions).  To prevent the recurrence of back pain, continue to do these exercises once each day or as told by your health care provider. Do exercises exactly as told by your health care provider and adjust them as directed. It is normal to feel mild stretching, pulling, tightness, or discomfort as you do these exercises, but you should stop right away if you feel sudden pain or your pain gets worse. Exercises Single knee to chest Repeat these  steps 3-5 times for each leg: 1. Lie on your back on a firm bed or the floor with your legs extended. 2. Bring one knee to your chest. Your other leg should stay extended and in contact with the floor. 3. Hold your knee in place by grabbing your knee or thigh with both hands and hold. 4. Pull on your knee until you feel a gentle stretch in your lower back or buttocks. 5. Hold the stretch for 10-30 seconds. 6. Slowly release and straighten your leg. Pelvic tilt Repeat these steps 5-10 times: 1. Lie on your back on a firm bed or the floor with your legs extended. 2. Bend your knees so they are pointing toward the ceiling and your feet are flat on the floor. 3. Tighten your lower abdominal muscles to press your lower back against the floor. This motion will tilt your pelvis so your tailbone points   up toward the ceiling instead of pointing to your feet or the floor. 4. With gentle tension and even breathing, hold this position for 5-10 seconds. Cat-cow Repeat these steps until your lower back becomes more flexible: 1. Get into a hands-and-knees position on a firm surface. Keep your hands under your shoulders, and keep your knees under your hips. You may place padding under your knees for comfort. 2. Let your head hang down toward your chest. Contract your abdominal muscles and point your tailbone toward the floor so your lower back becomes rounded like the back of a cat. 3. Hold this position for 5 seconds. 4. Slowly lift your head, let your abdominal muscles relax and point your tailbone up toward the ceiling so your back forms a sagging arch like the back of a cow. 5. Hold this position for 5 seconds.  Press-ups Repeat these steps 5-10 times: 1. Lie on your abdomen (face-down) on the floor. 2. Place your palms near your head, about shoulder-width apart. 3. Keeping your back as relaxed as possible and keeping your hips on the floor, slowly straighten your arms to raise the top half of your body  and lift your shoulders. Do not use your back muscles to raise your upper torso. You may adjust the placement of your hands to make yourself more comfortable. 4. Hold this position for 5 seconds while you keep your back relaxed. 5. Slowly return to lying flat on the floor.  Bridges Repeat these steps 10 times: 1. Lie on your back on a firm surface. 2. Bend your knees so they are pointing toward the ceiling and your feet are flat on the floor. Your arms should be flat at your sides, next to your body. 3. Tighten your buttocks muscles and lift your buttocks off the floor until your waist is at almost the same height as your knees. You should feel the muscles working in your buttocks and the back of your thighs. If you do not feel these muscles, slide your feet 1-2 inches farther away from your buttocks. 4. Hold this position for 3-5 seconds. 5. Slowly lower your hips to the starting position, and allow your buttocks muscles to relax completely. If this exercise is too easy, try doing it with your arms crossed over your chest. Abdominal crunches Repeat these steps 5-10 times: 1. Lie on your back on a firm bed or the floor with your legs extended. 2. Bend your knees so they are pointing toward the ceiling and your feet are flat on the floor. 3. Cross your arms over your chest. 4. Tip your chin slightly toward your chest without bending your neck. 5. Tighten your abdominal muscles and slowly raise your trunk (torso) high enough to lift your shoulder blades a tiny bit off the floor. Avoid raising your torso higher than that because it can put too much stress on your low back and does not help to strengthen your abdominal muscles. 6. Slowly return to your starting position. Back lifts Repeat these steps 5-10 times: 1. Lie on your abdomen (face-down) with your arms at your sides, and rest your forehead on the floor. 2. Tighten the muscles in your legs and your buttocks. 3. Slowly lift your chest off  the floor while you keep your hips pressed to the floor. Keep the back of your head in line with the curve in your back. Your eyes should be looking at the floor. 4. Hold this position for 3-5 seconds. 5. Slowly return to your   starting position. Contact a health care provider if:  Your back pain or discomfort gets much worse when you do an exercise.  Your worsening back pain or discomfort does not lessen within 2 hours after you exercise. If you have any of these problems, stop doing these exercises right away. Do not do them again unless your health care provider says that you can. Get help right away if:  You develop sudden, severe back pain. If this happens, stop doing the exercises right away. Do not do them again unless your health care provider says that you can. This information is not intended to replace advice given to you by your health care provider. Make sure you discuss any questions you have with your health care provider. Document Released: 05/12/2004 Document Revised: 08/09/2018 Document Reviewed: 01/04/2018 Elsevier Patient Education  2020 Elsevier Inc.   

## 2019-02-08 ENCOUNTER — Other Ambulatory Visit: Payer: Self-pay | Admitting: Internal Medicine

## 2019-02-08 MED ORDER — PREDNISONE 20 MG PO TABS: ORAL_TABLET | ORAL | 0 refills | Status: DC

## 2019-02-17 ENCOUNTER — Other Ambulatory Visit: Payer: Self-pay | Admitting: Internal Medicine

## 2019-02-17 MED ORDER — NEOMYCIN-POLYMYXIN-DEXAMETH 3.5-10000-0.1 OP SUSP: OPHTHALMIC | 1 refills | Status: DC

## 2019-02-20 NOTE — Progress Notes (Signed)
   Subjective:    Patient ID: Nicholas Crosby, male    DOB: [birth year], 53 y.o.   MRN: [id number]  HPI   This very nice 53 yo [name] presents with pains of his Rt posterior medial thigh that he describes as a burning or aching quality. Denies any injury.   Medication Sig  . aspirin 81 MG tablet Take 81 mg by mouth daily.  Marland Kitchen atenolol (TENORMIN) 100 MG tablet TAKE 1 TABLET BY MOUTH DAILY  . Cholecalciferol (VITAMIN D3) 5000 units CAPS TAKE 1 CAPSULE BY MOUTH DAILY (Patient taking differently: 2 (two) times a day. )  . doxazosin (CARDURA) 4 MG tablet Take 1 tablet at Bedtime for BP & Prostate (Patient taking differently: Take 1/4 tablet at Bedtime for BP & Prostate)  . enalapril (VASOTEC) 20 MG tablet TAKE 1 TABLET BY MOUTH DAILY  . finasteride (PROSCAR) 5 MG tablet Take 1 tablet daily for Prostate  . fluorouracil (EFUDEX) 5 % cream Apply to suspect areas 2 x / day til skin raw (Patient taking differently: as needed. Apply to suspect areas 2 x / day til skin raw)  . hydrochlorothiazide (HYDRODIURIL) 25 MG tablet Take 1 tablet Daily for BP & Fluid  . minoxidil (LONITEN) 10 MG tablet Take 1/2 to 1 tablet Daily for BP  . neomycin-polymyxin b-dexamethasone (MAXITROL) 3.5-10000-0.1 SUSP 1 to 2 drops to affected eye every 1 to 2 hours today, then 4 x /day  . rosuvastatin (CRESTOR) 40 MG tablet Take 1/2 to 1 tablet daily or as directed for Cholesterol  . testosterone cypionate (DEPOTESTOSTERONE CYPIONATE) 200 MG/ML injection Inject 2cc IM q 2 weeks and needs 3 cc syringes &  1" 21 G needles  . verapamil (CALAN-SR) 240 MG CR tablet TAKE 1 TABLET BY MOUTH DAILY WITH FOOD FOR BLOOD PRESSURE  . cyclobenzaprine (FLEXERIL) 10 MG tablet Take 1/2 to 1 tablet 2 to 3 x /day as needed for muscle spasm    Past Medical History:  Diagnosis Date  . Depression   . Hyperlipidemia   . Hypertension   . Prostate enlargement    Review of Systems    10 point systems review negative except as above.    Objective:   Physical Exam  BP 138/84   Pulse 64   Temp (!) 97.4 F (36.3 C)   Resp 16   Ht 6' (1.829 m)   Wt 275 lb 3.2 oz (124.8 kg)   BMI 37.32 kg/m   HEENT - WNL. Neck - supple.  Chest - Clear equal BS. Cor - Nl HS. RRR w/o sig MGR. PP 1(+). No edema. MS- FROM w/o deformities. Nl Hip ROM / fig 4.   Gait Nl. Neg SLR  Neuro -  Nl w/o focal abnormalities.   Assessment & Plan:   1. Sciatica of right side  - predniSONE (DELTASONE) 20 MG tablet; 1 tab 3 x day for 3 days, then 1 tab 2 x day for 3 days, then 1 tab 1 x day for 5 days  Dispense: 20 tablet; Refill: 1  - Advised if sx's change, worsen or not resolve return for re-evaluation.

## 2019-02-21 ENCOUNTER — Other Ambulatory Visit: Payer: Self-pay

## 2019-02-21 ENCOUNTER — Ambulatory Visit: Payer: Managed Care, Other (non HMO) | Admitting: Internal Medicine

## 2019-02-21 VITALS — BP 138/84 | HR 64 | Temp 97.4°F | Resp 16 | Ht 72.0 in | Wt 275.2 lb

## 2019-02-21 DIAGNOSIS — M5431 Sciatica, right side: Secondary | ICD-10-CM | POA: Diagnosis not present

## 2019-02-21 MED ORDER — PREDNISONE 20 MG PO TABS: ORAL_TABLET | ORAL | 1 refills | Status: DC

## 2019-02-24 ENCOUNTER — Encounter: Payer: Self-pay | Admitting: Internal Medicine

## 2019-02-26 ENCOUNTER — Other Ambulatory Visit: Payer: Self-pay | Admitting: Adult Health

## 2019-03-08 ENCOUNTER — Other Ambulatory Visit: Payer: Self-pay | Admitting: Internal Medicine

## 2019-03-08 DIAGNOSIS — M5431 Sciatica, right side: Secondary | ICD-10-CM

## 2019-03-19 ENCOUNTER — Encounter: Payer: Self-pay | Admitting: Internal Medicine

## 2019-03-26 ENCOUNTER — Ambulatory Visit: Payer: Managed Care, Other (non HMO) | Admitting: Adult Health Nurse Practitioner

## 2019-03-28 ENCOUNTER — Other Ambulatory Visit: Payer: Self-pay

## 2019-04-05 ENCOUNTER — Other Ambulatory Visit: Payer: Self-pay

## 2019-04-05 ENCOUNTER — Ambulatory Visit: Payer: Managed Care, Other (non HMO) | Admitting: Orthopaedic Surgery

## 2019-04-05 ENCOUNTER — Encounter: Payer: Self-pay | Admitting: Orthopaedic Surgery

## 2019-04-05 ENCOUNTER — Ambulatory Visit (INDEPENDENT_AMBULATORY_CARE_PROVIDER_SITE_OTHER): Payer: Managed Care, Other (non HMO)

## 2019-04-05 VITALS — BP 148/94 | HR 67 | Ht 71.0 in | Wt 275.0 lb

## 2019-04-05 DIAGNOSIS — M545 Low back pain, unspecified: Secondary | ICD-10-CM | POA: Insufficient documentation

## 2019-04-05 DIAGNOSIS — M5441 Lumbago with sciatica, right side: Secondary | ICD-10-CM | POA: Diagnosis not present

## 2019-04-05 NOTE — Progress Notes (Signed)
Office Visit Note   Patient: Nicholas Crosby           Date of Birth: 11-24-1965           MRN: [id number] Visit Date: 04/05/2019              Requested by: Unk Pinto, [address] [place],  [place] [address] PCP: Unk Pinto, MD   Assessment & Plan: Visit Diagnoses:  1. Acute right-sided low back pain, unspecified whether sciatica present   2. Acute right-sided low back pain with right-sided sciatica     Plan: Currently patient is got improvement.  The way he is moving I do not think proceeding with an MRI is needed at this point.  Should he have persistent symptoms after months then he can call back and we will seek approval for an MRI scan.  If he misses any days of work to back pain he can call let us know.  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  He can continue regular work.  Follow-Up Instructions: Return if symptoms worsen or fail to improve.   Orders:  Orders Placed This Encounter  Procedures  . XR Lumbar Spine 2-3 Views   No orders of the defined types were placed in this encounter.     Procedures: No procedures performed   Clinical Data: No additional findings.   Subjective: Chief Complaint  Patient presents with  . Lower Back - Pain  . 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  He has had some intermittent feelings of right leg weakness.  In past years he has had some symptoms similar to this but not as severe.  He has had prednisone taper not really sure again relief but overall he thinks he is gotten somewhat better in the last 4 to 5 weeks.  Last episode similar to this was 2019 but nowhere severe.  He gets some relief sitting.  MRI was ordered by PCP but  it was denied.  He has been seeing a chiropractor regularly and feels like this helps his symptoms to some degree.  No bowel bladder symptoms.  He still able to walk up stairs.  No neurogenic claudication symptoms.  Patient denies fever or associated chills.  Negative for gout.  Review of Systems 14 point review of systems positive for hypertension hyperlipidemia history of depression obesity.  History of abdominal aortic aneurysm.  Otherwise negative as it pertains HPI.   Objective: Vital Signs: BP (!) 148/94   Pulse 67   Ht 5\' 11"  (1.803 m)   Wt 275 lb (124.7 kg)   BMI 38.35 kg/m   Physical Exam Constitutional:      Appearance: He is well-developed.  HENT:     Head: Normocephalic and atraumatic.  Eyes:     Pupils: Pupils are equal, round, and reactive to light.  Neck:     Thyroid: No thyromegaly.     Trachea: No tracheal deviation.  Cardiovascular:     Rate and Rhythm: Normal rate.  Pulmonary:     Effort: Pulmonary effort is normal.     Breath sounds: No wheezing.  Abdominal:     General: Bowel sounds are normal.  Palpations: Abdomen is soft.  Skin:    General: Skin is warm and dry.     Capillary Refill: Capillary refill takes less than 2 seconds.  Neurological:     Mental Status: He is alert and oriented to person, place, and time.  Psychiatric:        Behavior: Behavior normal.        Thought Content: Thought content normal.        Judgment: Judgment normal.     Ortho Exam patient has negative logroll to the hips he can figure 4 easily.  No sciatic notch tenderness abductors quads are strong.  Negative popliteal compression test.  He is able to heel and toe walk.  He gets on and off the exam table rapidly and out of a chair.  Specialty Comments:  No specialty comments available.  Imaging: XR Lumbar Spine 2-3 Views  Result Date: 04/05/2019 AP lateral lumbar spine x-rays are obtained and reviewed.  This shows normal pelvis sacroiliac joints are normal.  No hip  joint narrowing.  No scoliosis.  Trace endplate spurring [id number] without significant disc space narrowing. Impression: Normal lumbar spine x-rays for patient's age.  Negative for acute changes.    PMFS History: Patient Active Problem List   Diagnosis Date Noted  . Low back pain 04/05/2019  . Prostatism 12/05/2017  . Abdominal aortic aneurysm ([place]) 01/19/2016  . Paroxysmal A-fib ([facility]) 10/12/2015  . Medication management 12/03/2013  . Morbid obesity (BMI 37.55) 08/05/2013  . Abnormal glucose 04/29/2013  . Hypertension   . Hyperlipidemia   . Testosterone deficiency   . Vitamin D deficiency   . 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 Depression   . Hyperlipidemia   . Hypertension   . Prostate enlargement     Family History  Problem Relation Age of Onset  . Hypertension Mother   . Hyperlipidemia Mother   . Diabetes Mother   . Cancer Mother        breast  . Heart disease Father   . Hyperlipidemia Father   . Hypertension Father     Past Surgical History:  Procedure Laterality Date  . COSMETIC SURGERY     liposuction  . CYST REMOVAL NECK    . NO PAST SURGERIES    . VASECTOMY  04/06/2012   Procedure: VASECTOMY;  Surgeon: Alexis Frock, MD;  Location: [facility];  Service: Urology;  Laterality: Bilateral;   Social History   Occupational History  . Not on file  Tobacco Use  . Smoking status: Never Smoker  . Smokeless tobacco: Never Used  Substance and Sexual Activity  . Alcohol use: Yes    Alcohol/week: 2.0 standard drinks    Types: 2 Cans of beer per week    Comment: social  . Drug use: No  . Sexual activity: Not on file

## 2019-04-24 ENCOUNTER — Encounter (HOSPITAL_BASED_OUTPATIENT_CLINIC_OR_DEPARTMENT_OTHER): Payer: Self-pay | Admitting: Emergency Medicine

## 2019-04-24 ENCOUNTER — Other Ambulatory Visit: Payer: Self-pay

## 2019-04-24 ENCOUNTER — Emergency Department (HOSPITAL_BASED_OUTPATIENT_CLINIC_OR_DEPARTMENT_OTHER)
Admission: EM | Admit: 2019-04-24 | Discharge: 2019-04-24 | Disposition: A | Discharge status: Home / Self Care | Payer: Managed Care, Other (non HMO) | Attending: Emergency Medicine | Admitting: Emergency Medicine

## 2019-04-24 DIAGNOSIS — R002 Palpitations: Secondary | ICD-10-CM

## 2019-04-24 DIAGNOSIS — I10 Essential (primary) hypertension: Secondary | ICD-10-CM | POA: Insufficient documentation

## 2019-04-24 DIAGNOSIS — Z7982 Long term (current) use of aspirin: Secondary | ICD-10-CM | POA: Diagnosis not present

## 2019-04-24 DIAGNOSIS — Z79899 Other long term (current) drug therapy: Secondary | ICD-10-CM | POA: Diagnosis not present

## 2019-04-24 NOTE — ED Triage Notes (Signed)
Patient arrived via POV c/o elevated heart rate from normal resting rate post vaccination for covid 19. Patient states he was awakened by an elevated heart rate. Patient is unsure just how elevated it initially was, but states on car ride to ED heart rate was high 80's when resting is normally low 60's. Patient is AO x 4, VS WDL, normal gait.

## 2019-04-24 NOTE — ED Provider Notes (Signed)
[facility] EMERGENCY DEPARTMENT Provider Note  CSN: [id number] Arrival date & time: 04/24/19 0443  Chief Complaint(s) Medical Clearance  HPI Nicholas Crosby is a 54 y.o. male   Patient reports waking up this morning with rapid palpitations and noted HR around 100.  The history is provided by the patient.  Palpitations Palpitations quality:  Regular Onset quality:  At rest Timing:  Constant Progression:  Resolved Context comment:  Patient reports drinking alcohol last night. He also got the [place] yesterday. Relieved by: spontaneously resolved. Associated symptoms: no chest pain, no chest pressure, no dizziness, no malaise/fatigue, no nausea and no shortness of breath     Past Medical History Past Medical History:  Diagnosis Date  . Depression   . Hyperlipidemia   . Hypertension   . Prostate enlargement    Patient Active Problem List   Diagnosis Date Noted  . Low back pain 04/05/2019  . Prostatism 12/05/2017  . Abdominal aortic aneurysm ([place]) 01/19/2016  . Paroxysmal A-fib ([place]) 10/12/2015  . Medication management 12/03/2013  . Morbid obesity (BMI 37.55) 08/05/2013  . Abnormal glucose 04/29/2013  . Hypertension   . Hyperlipidemia   . Testosterone deficiency   . Vitamin D deficiency   . Depression, major, in remission ([place])    Home Medication(s) Prior to Admission medications   Medication Sig Start Date End Date Taking? Authorizing Provider  aspirin 81 MG tablet Take 81 mg by mouth daily.    [provider]  atenolol (TENORMIN) 100 MG tablet TAKE 1 TABLET BY MOUTH DAILY 06/01/18   Unk Pinto, MD  Cholecalciferol (VITAMIN D3) 5000 units CAPS TAKE 1 CAPSULE BY MOUTH DAILY Patient taking differently: 2 (two) times a day.  03/09/17   Unk Pinto, MD  cyclobenzaprine (FLEXERIL) 10 MG tablet Take 1/2 to 1 tablet 2 to 3 x /day as needed for muscle spasm 12/12/17   Unk Pinto, MD  doxazosin (CARDURA) 4 MG tablet Take 1 tablet at  Bedtime for BP & Prostate Patient taking differently: Take 1/4 tablet at Bedtime for BP & Prostate 08/28/17   Unk Pinto, MD  enalapril (VASOTEC) 20 MG tablet TAKE 1 TABLET BY MOUTH DAILY 06/01/18   Unk Pinto, MD  finasteride (PROSCAR) 5 MG tablet Take 1 tablet daily for Prostate 01/23/19 01/23/20  Vicie Mutters, PA-C  fluorouracil (EFUDEX) 5 % cream Apply to suspect areas 2 x / day til skin raw Patient taking differently: as needed. Apply to suspect areas 2 x / day til skin raw 02/16/17   Unk Pinto, MD  hydrochlorothiazide (HYDRODIURIL) 25 MG tablet Take 1 tablet Daily for BP & Fluid 08/13/18   Unk Pinto, MD  minoxidil (LONITEN) 10 MG tablet Take 1/2 to 1 tablet Daily for BP 11/26/18   Unk Pinto, MD  neomycin-polymyxin b-dexamethasone (MAXITROL) 3.5-10000-0.1 SUSP 1 to 2 drops to affected eye every 1 to 2 hours today, then 4 x /day 02/17/19   Unk Pinto, MD  pravastatin (PRAVACHOL) 20 MG tablet Take 1 tablet at Bedtime for Cholesterol 02/26/19   Unk Pinto, MD  predniSONE (DELTASONE) 20 MG tablet 1 tab 3 x day for 3 days, then 1 tab 2 x day for 3 days, then 1 tab 1 x day for 5 days Patient not taking: Reported on 04/05/2019 02/21/19   Unk Pinto, MD  rosuvastatin (CRESTOR) 40 MG tablet Take 1/2 to 1 tablet daily or as directed for Cholesterol 04/04/18   Unk Pinto, MD  testosterone cypionate (DEPOTESTOSTERONE CYPIONATE) 200 MG/ML injection  Inject 2cc IM q 2 weeks and needs 3 cc syringes &  1" 21 G needles 12/18/18 10/12/19  Unk Pinto, MD  verapamil (CALAN-SR) 240 MG CR tablet TAKE 1 TABLET BY MOUTH DAILY WITH FOOD FOR BLOOD PRESSURE 06/01/18   Unk Pinto, MD                                                                                                                                    Past Surgical History Past Surgical History:  Procedure Laterality Date  . COSMETIC SURGERY     liposuction  . CYST REMOVAL NECK    . NO PAST  SURGERIES    . VASECTOMY  04/06/2012   Procedure: VASECTOMY;  Surgeon: Alexis Frock, MD;  Location: [facility];  Service: Urology;  Laterality: Bilateral;   Family History Family History  Problem Relation Age of Onset  . Hypertension Mother   . Hyperlipidemia Mother   . Diabetes Mother   . Cancer Mother        breast  . Heart disease Father   . Hyperlipidemia Father   . Hypertension Father     Social History Social History   Tobacco Use  . Smoking status: Never Smoker  . Smokeless tobacco: Never Used  Substance Use Topics  . Alcohol use: Yes    Alcohol/week: 2.0 standard drinks    Types: 2 Cans of beer per week    Comment: social  . Drug use: No   Allergies Zocor [simvastatin] and Crestor [rosuvastatin]  Review of Systems Review of Systems  Constitutional: Negative for malaise/fatigue.  HENT: Negative for facial swelling, trouble swallowing and voice change.   Respiratory: Negative for chest tightness, shortness of breath and wheezing.   Cardiovascular: Positive for palpitations. Negative for chest pain.  Gastrointestinal: Negative for nausea.  Neurological: Negative for dizziness.   All other systems are reviewed and are negative for acute change except as noted in the HPI  Physical Exam Vital Signs  I have reviewed the triage vital signs BP (!) 156/96 (BP Location: Right Arm)   Pulse 79   Temp 98.4 F (36.9 C) (Oral)   Resp 18   Ht 5\' 11"  (1.803 m)   Wt 124.7 kg   SpO2 96%   BMI 38.35 kg/m   Physical Exam Vitals reviewed.  Constitutional:      General: He is not in acute distress.    Appearance: He is well-developed. He is not diaphoretic.  HENT:     Head: Normocephalic and atraumatic.     Nose: Nose normal.     Mouth/Throat:     Mouth: No angioedema.     Pharynx: No pharyngeal swelling or uvula swelling.  Eyes:     General: No scleral icterus.       Right eye: No discharge.        Left eye: No discharge.      Conjunctiva/sclera: Conjunctivae  normal.     Pupils: Pupils are equal, round, and reactive to light.  Cardiovascular:     Rate and Rhythm: Normal rate and regular rhythm.     Heart sounds: No murmur. No friction rub. No gallop.   Pulmonary:     Effort: Pulmonary effort is normal. No respiratory distress.     Breath sounds: Normal breath sounds. No stridor. No rales.  Abdominal:     General: There is no distension.     Palpations: Abdomen is soft.     Tenderness: There is no abdominal tenderness.  Musculoskeletal:        General: No tenderness.     Cervical back: Normal range of motion and neck supple.  Skin:    General: Skin is warm and dry.     Findings: No erythema or rash.  Neurological:     Mental Status: He is alert and oriented to person, place, and time.     ED Results and Treatments Labs (all labs ordered are listed, but only abnormal results are displayed) Labs Reviewed - No data to display                                                                                                                       EKG  EKG Interpretation  Date/Time:  Wednesday April 24 2019 04:52:12 EST Ventricular Rate:  80 PR Interval:    QRS Duration: 118 QT Interval:  384 QTC Calculation: 443 R Axis:   72 Text Interpretation: Sinus rhythm Borderline repolarization abnormality Baseline wander in lead(s) V1 artifact in V2 and v3 Otherwise no significant change Confirmed by Addison Lank [phone]) on 04/24/2019 4:57:39 AM      Radiology No results found.  Pertinent labs & imaging results that were available during my care of the patient were reviewed by me and considered in my medical decision making (see chart for details).  Medications Ordered in ED Medications - No data to display                                                                                                                                  Procedures Procedures  (including critical care time)  Medical  Decision Making / ED Course I have reviewed the nursing notes for this encounter and the patient's prior records (if available in EHR or on provided paperwork).   Darnelle Maffucci was evaluated in Emergency Department on  04/24/2019 for the symptoms described in the history of present illness. He was evaluated in the context of the global COVID-19 pandemic, which necessitated consideration that the patient might be at risk for infection with the SARS-CoV-2 virus that causes COVID-19. Institutional protocols and algorithms that pertain to the evaluation of patients at risk for COVID-19 are in a state of rapid change based on information released by regulatory bodies including the CDC and federal and state organizations. These policies and algorithms were followed during the patient's care in the ED.  Patient presents with palpitations. Likely related to other alcohol consumption last night for vaccine earlier in the day. He denied any other associated symptoms concerning for anaphylactic reaction. Patient's heart rate has improved.  EKG without acute ischemic changes, dysrhythmias, blocks. Currently asymptomatic.  The patient appears reasonably screened and/or stabilized for discharge and I doubt any other medical condition or other [facility] requiring further screening, evaluation, or treatment in the ED at this time prior to discharge.  The patient is safe for discharge with strict return precautions.       Final Clinical Impression(s) / ED Diagnoses Final diagnoses:  Rapid palpitations    The patient appears reasonably screened and/or stabilized for discharge and I doubt any other medical condition or other [facility] requiring further screening, evaluation, or treatment in the ED at this time prior to discharge.  Disposition: Discharge  Condition: Good  I have discussed the results, Dx and Tx plan with the patient who expressed understanding and agree(s) with the plan. Discharge instructions  discussed at great length. The patient was given strict return precautions who verbalized understanding of the instructions. No further questions at time of discharge.    ED Discharge Orders    None        Follow Up: Unk Pinto, MD [address] [place] [address] [phone]  Schedule an appointment as soon as possible for a visit  As needed      This chart was dictated using voice recognition software.  Despite best efforts to proofread,  errors can occur which can change the documentation meaning.   Fatima Blank, MD 04/24/19 [phone]

## 2019-05-04 ENCOUNTER — Other Ambulatory Visit: Payer: Self-pay | Admitting: Internal Medicine

## 2019-05-04 DIAGNOSIS — I10 Essential (primary) hypertension: Secondary | ICD-10-CM

## 2019-05-04 MED ORDER — OLMESARTAN MEDOXOMIL 40 MG PO TABS: ORAL_TABLET | ORAL | 3 refills | Status: DC

## 2019-05-12 ENCOUNTER — Other Ambulatory Visit: Payer: Self-pay | Admitting: Internal Medicine

## 2019-05-12 DIAGNOSIS — I10 Essential (primary) hypertension: Secondary | ICD-10-CM

## 2019-05-12 MED ORDER — OLMESARTAN MEDOXOMIL 40 MG PO TABS: ORAL_TABLET | ORAL | 3 refills | Status: DC

## 2019-05-15 ENCOUNTER — Encounter: Payer: Self-pay | Admitting: Internal Medicine

## 2019-05-15 ENCOUNTER — Ambulatory Visit: Payer: Managed Care, Other (non HMO) | Admitting: Internal Medicine

## 2019-05-15 ENCOUNTER — Other Ambulatory Visit: Payer: Self-pay

## 2019-05-15 VITALS — BP 136/84 | HR 64 | Temp 97.0°F | Resp 18 | Ht 72.0 in | Wt 281.8 lb

## 2019-05-15 DIAGNOSIS — Z125 Encounter for screening for malignant neoplasm of prostate: Secondary | ICD-10-CM

## 2019-05-15 DIAGNOSIS — Z136 Encounter for screening for cardiovascular disorders: Secondary | ICD-10-CM

## 2019-05-15 DIAGNOSIS — Z8249 Family history of ischemic heart disease and other diseases of the circulatory system: Secondary | ICD-10-CM | POA: Diagnosis not present

## 2019-05-15 DIAGNOSIS — Z79899 Other long term (current) drug therapy: Secondary | ICD-10-CM | POA: Diagnosis not present

## 2019-05-15 DIAGNOSIS — R7309 Other abnormal glucose: Secondary | ICD-10-CM

## 2019-05-15 DIAGNOSIS — Z0001 Encounter for general adult medical examination with abnormal findings: Secondary | ICD-10-CM

## 2019-05-15 DIAGNOSIS — R7303 Prediabetes: Secondary | ICD-10-CM

## 2019-05-15 DIAGNOSIS — Z1212 Encounter for screening for malignant neoplasm of rectum: Secondary | ICD-10-CM

## 2019-05-15 DIAGNOSIS — Z1211 Encounter for screening for malignant neoplasm of colon: Secondary | ICD-10-CM

## 2019-05-15 DIAGNOSIS — I10 Essential (primary) hypertension: Secondary | ICD-10-CM | POA: Diagnosis not present

## 2019-05-15 DIAGNOSIS — Z0000 Encounter for general adult medical examination without abnormal findings: Secondary | ICD-10-CM | POA: Diagnosis not present

## 2019-05-15 DIAGNOSIS — E559 Vitamin D deficiency, unspecified: Secondary | ICD-10-CM | POA: Diagnosis not present

## 2019-05-15 DIAGNOSIS — E782 Mixed hyperlipidemia: Secondary | ICD-10-CM

## 2019-05-15 DIAGNOSIS — Z111 Encounter for screening for respiratory tuberculosis: Secondary | ICD-10-CM

## 2019-05-15 DIAGNOSIS — Z1329 Encounter for screening for other suspected endocrine disorder: Secondary | ICD-10-CM | POA: Diagnosis not present

## 2019-05-15 DIAGNOSIS — I700 Atherosclerosis of aorta: Secondary | ICD-10-CM

## 2019-05-15 DIAGNOSIS — R350 Frequency of micturition: Secondary | ICD-10-CM | POA: Diagnosis not present

## 2019-05-15 DIAGNOSIS — Z13220 Encounter for screening for lipoid disorders: Secondary | ICD-10-CM | POA: Diagnosis not present

## 2019-05-15 DIAGNOSIS — Z1389 Encounter for screening for other disorder: Secondary | ICD-10-CM | POA: Diagnosis not present

## 2019-05-15 DIAGNOSIS — N401 Enlarged prostate with lower urinary tract symptoms: Secondary | ICD-10-CM | POA: Diagnosis not present

## 2019-05-15 DIAGNOSIS — Z131 Encounter for screening for diabetes mellitus: Secondary | ICD-10-CM | POA: Diagnosis not present

## 2019-05-15 DIAGNOSIS — R5383 Other fatigue: Secondary | ICD-10-CM

## 2019-05-15 DIAGNOSIS — E349 Endocrine disorder, unspecified: Secondary | ICD-10-CM

## 2019-05-15 DIAGNOSIS — Z130 Encounter for screening for diseases of the blood and blood-forming organs and certain disorders involving the immune mechanism: Secondary | ICD-10-CM | POA: Diagnosis not present

## 2019-05-15 NOTE — Patient Instructions (Signed)

## 2019-05-15 NOTE — Progress Notes (Signed)
Annual  Screening/Preventative Visit  & Comprehensive Evaluation & Examination     This very nice 54 y.o. [name]  presents for a Screening /Preventative Visit & comprehensive evaluation and management of multiple medical co-morbidities.  Patient has been followed for HTN, HLD, T2_NIDDM  Prediabetes and Vitamin D Deficiency.     HTN predates since age 62 in 59. Patient's BP has been controlled at home.  Patient's BP has been labile and has required 5-6 meds to maintain control. In 2017, he had a negative Renal Aa U/S.  Today's BP is at goal - 136/84. Patient has had 1 remote episode of transient pAfb (CHADsVASc=1) and is on LD bASA .  Patient denies any cardiac symptoms as chest pain, palpitations, shortness of breath, dizziness or ankle swelling.     Patient's hyperlipidemia is not controlled with diet and Pravastatin. Patient denies myalgias or other medication SE's. Last lipids were not at goal:  Lab Results  Component Value Date   CHOL 190 01/23/2019   HDL 43 01/23/2019   LDLCALC 122 (H) 01/23/2019   TRIG 131 01/23/2019   CHOLHDL 4.4 01/23/2019      Patient has hx/o prediabetes (A1c 6.4% /  2012 & 5.8% / 2014)and patient denies reactive hypoglycemic symptoms, visual blurring, diabetic polys or paresthesias. Last A1c was Normal & at goal:  Lab Results  Component Value Date   HGBA1C 5.6 01/23/2019                                           Patient also has hx/o Low Testosterone ("191" / 2012 & "150" / 2014) and is on parenteral replacement  with improved stamina & sense of well being.       Finally, patient has history of Vitamin D Deficiency  ("21" / 2008) and last vitamin D was at goal:  Lab Results  Component Value Date   VD25OH 71 01/23/2019   Current Outpatient Medications on File Prior to Visit  Medication Sig  . aspirin 81 MG tablet Take 81 mg by mouth daily.  Marland Kitchen atenolol (TENORMIN) 100 MG tablet TAKE 1 TABLET BY MOUTH DAILY  . Cholecalciferol (VITAMIN D3) 5000 units  CAPS TAKE 1 CAPSULE BY MOUTH DAILY (Patient taking differently: 2 (two) times a day. )  . cyclobenzaprine (FLEXERIL) 10 MG tablet Take 1/2 to 1 tablet 2 to 3 x /day as needed for muscle spasm  . doxazosin (CARDURA) 4 MG tablet Take 1 tablet at Bedtime for BP & Prostate (Patient taking differently: Take 1/4 tablet at Bedtime for BP & Prostate)  . finasteride (PROSCAR) 5 MG tablet Take 1 tablet daily for Prostate  . fluorouracil (EFUDEX) 5 % cream Apply to suspect areas 2 x / day til skin raw (Patient taking differently: as needed. Apply to suspect areas 2 x / day til skin raw)  . hydrochlorothiazide (HYDRODIURIL) 25 MG tablet Take 1 tablet Daily for BP & Fluid  . minoxidil (LONITEN) 10 MG tablet Take 1/2 to 1 tablet Daily for BP  . neomycin-polymyxin b-dexamethasone (MAXITROL) 3.5-10000-0.1 SUSP 1 to 2 drops to affected eye every 1 to 2 hours today, then 4 x /day  . olmesartan (BENICAR) 40 MG tablet Take 1 tablet Daily for BP  . pravastatin (PRAVACHOL) 20 MG tablet Take 1 tablet at Bedtime for Cholesterol  . testosterone cypionate (DEPOTESTOSTERONE CYPIONATE) 200 MG/ML injection Inject 2cc IM q  2 weeks and needs 3 cc syringes &  1" 21 G needles  . verapamil (CALAN-SR) 240 MG CR tablet TAKE 1 TABLET BY MOUTH DAILY WITH FOOD FOR BLOOD PRESSURE   No current facility-administered medications on file prior to visit.   Allergies  Allergen Reactions  . Zocor [Simvastatin] Other (See Comments)    cpk elev  . Crestor [Rosuvastatin] Palpitations   Past Medical History:  Diagnosis Date  . Depression   . Hyperlipidemia   . Hypertension   . Prostate enlargement    Health Maintenance  Topic Date Due  . PNEUMOCOCCAL POLYSACCHARIDE VACCINE AGE 50-64 HIGH RISK  11/11/1968  . FOOT EXAM  11/11/1976  . OPHTHALMOLOGY EXAM  11/11/1976  . INFLUENZA VACCINE  11/17/2018  . HEMOGLOBIN A1C  07/24/2019  . Fecal DNA (Cologuard)  03/25/2020  . TETANUS/TDAP  05/17/2025  . HIV Screening  Completed    Immunization History  Administered Date(s) Administered  . Influenza-Unspecified 01/17/2015, 01/16/2017, 08/29/2017, 01/16/2018  . Tdap 01/09/2007, 05/18/2015   - Negative Cologard 03/25/2017 - due 3 year f/u in Dec 2021  - Patient has hx of a (+) PPD in 2008 & Negative TB Gold in 2019.  Past Surgical History:  Procedure Laterality Date  . COSMETIC SURGERY     liposuction  . CYST REMOVAL NECK    . NO PAST SURGERIES    . VASECTOMY  04/06/2012   Procedure: VASECTOMY;  Surgeon: Alexis Frock, MD;  Location: [facility];  Service: Urology;  Laterality: Bilateral;   Family History  Problem Relation Age of Onset  . Hypertension Mother   . Hyperlipidemia Mother   . Diabetes Mother   . Cancer Mother        breast  . Heart disease Father   . Hyperlipidemia Father   . Hypertension Father    Social History   Socioeconomic History  . Marital status: Divorced  . Number of children: 1 son & 1 daughter  Occupational History  . [employer] for [employer]  . Smoking status: Never Smoker  . Smokeless tobacco: Never Used  Substance and Sexual Activity  . Alcohol use: Yes    Alcohol/week: 2.0 standard drinks    Types: 2 Cans of beer per week    Comment: social  . Drug use: No  . Sexual activity: Active    ROS Constitutional: Denies fever, chills, weight loss/gain, headaches, insomnia,  night sweats or change in appetite. Does c/o fatigue. Eyes: Denies redness, blurred vision, diplopia, discharge, itchy or watery eyes.  ENT: Denies discharge, congestion, post nasal drip, epistaxis, sore throat, earache, hearing loss, dental pain, Tinnitus, Vertigo, Sinus pain or snoring.  Cardio: Denies chest pain, palpitations, irregular heartbeat, syncope, dyspnea, diaphoresis, orthopnea, PND, claudication or edema Respiratory: denies cough, dyspnea, DOE, pleurisy, hoarseness, laryngitis or wheezing.  Gastrointestinal: Denies dysphagia,  heartburn, reflux, water brash, pain, cramps, nausea, vomiting, bloating, diarrhea, constipation, hematemesis, melena, hematochezia, jaundice or hemorrhoids Genitourinary: Denies dysuria, frequency,  discharge, hematuria or flank pain. Has urgency, nocturia x 2-3 & occasional hesitancy. Musculoskeletal: Denies arthralgia, myalgia, stiffness, Jt. Swelling, pain, limp or strain/sprain. Denies Falls. Skin: Denies puritis, rash, hives, warts, acne, eczema or change in skin lesion Neuro: No weakness, tremor, incoordination, spasms, paresthesia or pain Psychiatric: Denies confusion, memory loss or sensory loss. Denies Depression. Endocrine: Denies change in weight, skin, hair change, nocturia, and paresthesia, diabetic polys, visual blurring or hyper / hypo glycemic episodes.  Heme/Lymph: No excessive bleeding, bruising  or enlarged lymph nodes.  Physical Exam  BP 136/84   Pulse 64   Temp (!) 97 F (36.1 C)   Resp 18   Ht 6' (1.829 m)   Wt 281 lb 12.8 oz (127.8 kg)   BMI 38.22 kg/m   General Appearance: Well nourished and well groomed and in no apparent distress.  Eyes: PERRLA, EOMs, conjunctiva no swelling or erythema, normal fundi and vessels. Sinuses: No frontal/maxillary tenderness ENT/Mouth: EACs patent / TMs  nl. Nares clear without erythema, swelling, mucoid exudates. Oral hygiene is good. No erythema, swelling, or exudate. Tongue normal, non-obstructing. Tonsils not swollen or erythematous. Hearing normal.  Neck: Supple, thyroid not palpable. No bruits, nodes or JVD. Respiratory: Respiratory effort normal.  BS equal and clear bilateral without rales, rhonci, wheezing or stridor. Cardio: Heart sounds are normal with regular rate and rhythm and no murmurs, rubs or gallops. Peripheral pulses are normal and equal bilaterally without edema. No aortic or femoral bruits. Chest: symmetric with normal excursions and percussion.  Abdomen: Soft, with Nl bowel sounds. Nontender, no guarding,  rebound, hernias, masses, or organomegaly.  Lymphatics: Non tender without lymphadenopathy.  Musculoskeletal: Full ROM all peripheral extremities, joint stability, 5/5 strength, and normal gait. Skin: Warm and dry without rashes, lesions, cyanosis, clubbing or  ecchymosis.  Neuro: Cranial nerves intact, reflexes equal bilaterally. Normal muscle tone, no cerebellar symptoms. Sensation intact.  Pysch: Alert and oriented X 3 with normal affect, insight and judgment appropriate.   Assessment and Plan  1. Annual Preventative/Screening Exam   2. Essential hypertension  - EKG 12-Lead - [place], RETROPERITNL ABD,  LTD - Urinalysis, Routine w reflex microscopic - Microalbumin / creatinine urine ratio - CBC with Differential/Platelet - COMPLETE METABOLIC PANEL WITH GFR - Magnesium - TSH  3. Hyperlipidemia, mixed  - EKG 12-Lead - [place], RETROPERITNL ABD,  LTD - Lipid panel - TSH  4. Abnormal glucose  - EKG 12-Lead - [place], RETROPERITNL ABD,  LTD - Hemoglobin A1c - Insulin, random  5. Vitamin D deficiency  - VITAMIN D 25 Hydroxy  6. Prediabetes  - EKG 12-Lead - [place], RETROPERITNL ABD,  LTD - Hemoglobin A1c - Insulin, random  7. Testosterone deficiency  - Testosterone  8. Screening for colorectal cancer  - POC Hemoccult Bld/Stl   9. Prostate cancer screening  - PSA  10. Screening for ischemic heart disease  - EKG 12-Lead  11. FHx: heart disease  - EKG 12-Lead - [place], RETROPERITNL ABD,  LTD  12. Abdominal aortic atherosclerosis (HCC)  - [place], RETROPERITNL ABD,  LTD  13. Screening for AAA (aortic abdominal aneurysm)  - [place], RETROPERITNL ABD,  LTD  14. Screening-pulmonary TB  - TB Skin Test  15. Fatigue  - Iron,Total/Total Iron Binding Cap - Vitamin B12 - CBC with Differential/Platelet - TSH  16. Medication management  - Urinalysis, Routine w reflex microscopic - Microalbumin / creatinine urine ratio - Testosterone - CBC with Differential/Platelet -  COMPLETE METABOLIC PANEL WITH GFR - Magnesium - Lipid panel - TSH - Hemoglobin A1c - Insulin, random - VITAMIN D 25 Hydroxy         Patient was counseled in prudent diet, weight control to achieve/maintain BMI less than 25, BP monitoring, regular exercise and medications as discussed.  Discussed med effects and SE's. Routine screening labs and tests as requested with regular follow-up as recommended. Over 40 minutes of exam, counseling, chart review and high complex critical decision making was performed   Kirtland Bouchard, MD

## 2019-05-16 ENCOUNTER — Other Ambulatory Visit: Payer: Self-pay | Admitting: Internal Medicine

## 2019-05-16 LAB — CBC WITH DIFFERENTIAL/PLATELET
Absolute Monocytes: 769 cells/uL (ref 200–950)
Basophils Absolute: 50 cells/uL (ref 0–200)
Basophils Relative: 0.8 %
Eosinophils Absolute: 62 cells/uL (ref 15–500)
Eosinophils Relative: 1 %
HCT: 45.4 % (ref 38.5–50.0)
Hemoglobin: 15.1 g/dL (ref 13.2–17.1)
Lymphs Abs: 1252 cells/uL (ref 850–3900)
MCH: 26.7 pg — ABNORMAL LOW (ref 27.0–33.0)
MCHC: 33.3 g/dL (ref 32.0–36.0)
MCV: 80.2 fL (ref 80.0–100.0)
MPV: 11.2 fL (ref 7.5–12.5)
Monocytes Relative: 12.4 %
Neutro Abs: 4067 cells/uL (ref 1500–7800)
Neutrophils Relative %: 65.6 %
Platelets: 258 10*3/uL (ref 140–400)
RBC: 5.66 10*6/uL (ref 4.20–5.80)
RDW: 14.2 % (ref 11.0–15.0)
Total Lymphocyte: 20.2 %
WBC: 6.2 10*3/uL (ref 3.8–10.8)

## 2019-05-16 LAB — COMPLETE METABOLIC PANEL WITH GFR
AG Ratio: 1.7 (calc) (ref 1.0–2.5)
ALT: 30 U/L (ref 9–46)
AST: 18 U/L (ref 10–35)
Albumin: 4.6 g/dL (ref 3.6–5.1)
Alkaline phosphatase (APISO): 38 U/L (ref 35–144)
BUN: 13 mg/dL (ref 7–25)
CO2: 33 mmol/L — ABNORMAL HIGH (ref 20–32)
Calcium: 10.1 mg/dL (ref 8.6–10.3)
Chloride: 99 mmol/L (ref 98–110)
Creat: 1.06 mg/dL (ref 0.70–1.33)
GFR, Est African American: 93 mL/min/{1.73_m2} (ref >=60)
GFR, Est Non African American: 80 mL/min/{1.73_m2} (ref >=60)
Globulin: 2.7 g/dL (calc) (ref 1.9–3.7)
Glucose, Bld: 90 mg/dL (ref 65–99)
Potassium: 4.1 mmol/L (ref 3.5–5.3)
Sodium: 138 mmol/L (ref 135–146)
Total Bilirubin: 0.6 mg/dL (ref 0.2–1.2)
Total Protein: 7.3 g/dL (ref 6.1–8.1)

## 2019-05-16 LAB — LIPID PANEL
Cholesterol: 197 mg/dL (ref <200)
HDL: 39 mg/dL — ABNORMAL LOW (ref >=40)
LDL Cholesterol (Calc): 116 mg/dL (calc) — ABNORMAL HIGH
Non-HDL Cholesterol (Calc): 158 mg/dL (calc) — ABNORMAL HIGH (ref <130)
Total CHOL/HDL Ratio: 5.1 (calc) — ABNORMAL HIGH (ref <5.0)
Triglycerides: 292 mg/dL — ABNORMAL HIGH (ref <150)

## 2019-05-16 LAB — URINALYSIS, ROUTINE W REFLEX MICROSCOPIC
Bilirubin Urine: NEGATIVE
Glucose, UA: NEGATIVE
Hgb urine dipstick: NEGATIVE
Ketones, ur: NEGATIVE
Leukocytes,Ua: NEGATIVE
Nitrite: NEGATIVE
Protein, ur: NEGATIVE
Specific Gravity, Urine: 1.018 (ref 1.001–1.03)
pH: 6 (ref 5.0–8.0)

## 2019-05-16 LAB — MICROALBUMIN / CREATININE URINE RATIO
Creatinine, Urine: 139 mg/dL (ref 20–320)
Microalb Creat Ratio: 5 mcg/mg creat (ref <30)
Microalb, Ur: 0.7 mg/dL

## 2019-05-16 LAB — TSH: TSH: 1.71 mIU/L (ref 0.40–4.50)

## 2019-05-16 LAB — IRON, TOTAL/TOTAL IRON BINDING CAP
%SAT: 27 % (calc) (ref 20–48)
Iron: 98 ug/dL (ref 50–180)
TIBC: 362 mcg/dL (calc) (ref 250–425)

## 2019-05-16 LAB — VITAMIN D 25 HYDROXY (VIT D DEFICIENCY, FRACTURES): Vit D, 25-Hydroxy: 69 ng/mL (ref 30–100)

## 2019-05-16 LAB — VITAMIN B12: Vitamin B-12: 356 pg/mL (ref 200–1100)

## 2019-05-16 LAB — HEMOGLOBIN A1C
Hgb A1c MFr Bld: 5.9 % of total Hgb — ABNORMAL HIGH (ref <5.7)
Mean Plasma Glucose: 123 (calc)
eAG (mmol/L): 6.8 (calc)

## 2019-05-16 LAB — TESTOSTERONE: Testosterone: 183 ng/dL — ABNORMAL LOW (ref 250–827)

## 2019-05-16 LAB — INSULIN, RANDOM: Insulin: 13.7 u[IU]/mL

## 2019-05-16 LAB — MAGNESIUM: Magnesium: 2 mg/dL (ref 1.5–2.5)

## 2019-05-16 LAB — PSA: PSA: 0.4 ng/mL (ref <=4.0)

## 2019-05-16 MED ORDER — ATORVASTATIN CALCIUM 40 MG PO TABS: ORAL_TABLET | ORAL | 1 refills | Status: DC

## 2019-05-16 MED ORDER — ATORVASTATIN CALCIUM 40 MG PO TABS: 40.0000 mg | ORAL_TABLET | Freq: Every day | ORAL | 11 refills | Status: DC

## 2019-05-18 ENCOUNTER — Encounter: Payer: Self-pay | Admitting: Internal Medicine

## 2019-05-28 ENCOUNTER — Other Ambulatory Visit: Payer: Self-pay | Admitting: Internal Medicine

## 2019-05-28 DIAGNOSIS — I714 Abdominal aortic aneurysm, without rupture, unspecified: Secondary | ICD-10-CM

## 2019-05-28 DIAGNOSIS — R0989 Other specified symptoms and signs involving the circulatory and respiratory systems: Secondary | ICD-10-CM

## 2019-06-17 ENCOUNTER — Other Ambulatory Visit: Payer: Self-pay | Admitting: Internal Medicine

## 2019-06-17 DIAGNOSIS — N401 Enlarged prostate with lower urinary tract symptoms: Secondary | ICD-10-CM

## 2019-06-17 DIAGNOSIS — I10 Essential (primary) hypertension: Secondary | ICD-10-CM

## 2019-06-17 MED ORDER — DOXAZOSIN MESYLATE 4 MG PO TABS: ORAL_TABLET | ORAL | 3 refills | Status: DC

## 2019-06-18 ENCOUNTER — Other Ambulatory Visit: Payer: Self-pay

## 2019-06-18 ENCOUNTER — Ambulatory Visit (HOSPITAL_COMMUNITY): Payer: Managed Care, Other (non HMO)

## 2019-06-18 ENCOUNTER — Ambulatory Visit (HOSPITAL_COMMUNITY)
Admission: RE | Admit: 2019-06-18 | Discharge: 2019-06-18 | Disposition: A | Discharge status: Home / Self Care | Payer: Managed Care, Other (non HMO) | Source: Ambulatory Visit | Attending: Cardiology | Admitting: Cardiology

## 2019-06-18 DIAGNOSIS — I714 Abdominal aortic aneurysm, without rupture, unspecified: Secondary | ICD-10-CM

## 2019-06-18 DIAGNOSIS — R0989 Other specified symptoms and signs involving the circulatory and respiratory systems: Secondary | ICD-10-CM | POA: Diagnosis not present

## 2019-07-11 ENCOUNTER — Other Ambulatory Visit: Payer: Self-pay | Admitting: Internal Medicine

## 2019-07-11 MED ORDER — MINOXIDIL 10 MG PO TABS: ORAL_TABLET | ORAL | 3 refills | Status: DC

## 2019-07-12 ENCOUNTER — Other Ambulatory Visit: Payer: Self-pay | Admitting: Internal Medicine

## 2019-07-12 DIAGNOSIS — J40 Bronchitis, not specified as acute or chronic: Secondary | ICD-10-CM

## 2019-07-12 MED ORDER — PREDNISONE 20 MG PO TABS: ORAL_TABLET | ORAL | 0 refills | Status: DC

## 2019-07-12 MED ORDER — AZITHROMYCIN 250 MG PO TABS: ORAL_TABLET | ORAL | 1 refills | Status: DC

## 2019-07-17 ENCOUNTER — Other Ambulatory Visit: Payer: Self-pay

## 2019-07-17 ENCOUNTER — Ambulatory Visit: Payer: Managed Care, Other (non HMO) | Admitting: Internal Medicine

## 2019-07-17 VITALS — BP 156/104 | HR 56 | Temp 97.8°F | Resp 16 | Ht 72.0 in | Wt 280.1 lb

## 2019-07-17 DIAGNOSIS — R0989 Other specified symptoms and signs involving the circulatory and respiratory systems: Secondary | ICD-10-CM

## 2019-07-17 MED ORDER — AMLODIPINE BESYLATE 10 MG PO TABS: ORAL_TABLET | ORAL | 3 refills | Status: DC

## 2019-07-17 MED ORDER — TORSEMIDE 20 MG PO TABS: ORAL_TABLET | ORAL | 3 refills | Status: DC

## 2019-07-17 NOTE — Progress Notes (Addendum)
History of Present Illness:     This very nice 54 yo [name] with hx/o HTN present s with c/o of recent elevated BP's. Denies any HA's, dizziness, CP, palpitations , dyspnea.   Medications  Current Outpatient Medications (Endocrine & Metabolic):  .  predniSONE (DELTASONE) 20 MG tablet, 1 tab 3 x day for 3 days, then 1 tab 2 x day for 3 days, then 1 tab 1 x day for 5 days .  testosterone cypionate (DEPOTESTOSTERONE CYPIONATE) 200 MG/ML injection, Inject 2cc IM q 2 weeks and needs 3 cc syringes &  1" 21 G needles  Current Outpatient Medications (Cardiovascular):  .  atenolol (TENORMIN) 100 MG tablet, TAKE 1 TABLET BY MOUTH DAILY .  atorvastatin (LIPITOR) 40 MG tablet, Take 1 tablet Daily for Cholestrol .  doxazosin (CARDURA) 4 MG tablet, Take 1 tablet at Bedtime for BP & Prostate .  hydrochlorothiazide (HYDRODIURIL) 25 MG tablet, Take 1 tablet Daily for BP & Fluid .  minoxidil (LONITEN) 10 MG tablet, Take 1 to 2 tablets  Daily for BP .  olmesartan (BENICAR) 40 MG tablet, Take 1 tablet Daily for BP .  pravastatin (PRAVACHOL) 20 MG tablet, Take 1 tablet at Bedtime for Cholesterol .  verapamil (CALAN-SR) 240 MG CR tablet, TAKE 1 TABLET BY MOUTH DAILY WITH FOOD FOR BLOOD PRESSURE   Current Outpatient Medications (Analgesics):  .  aspirin 81 MG tablet, Take 81 mg by mouth daily.   Current Outpatient Medications (Other):  .  azithromycin (ZITHROMAX) 250 MG tablet, Take 2 tablets with Food on  Day 1, then 1 tablet Daily with Food for Infection (Dx bronchitis - J40) .  Cholecalciferol (VITAMIN D3) 5000 units CAPS, TAKE 1 CAPSULE BY MOUTH DAILY (Patient taking differently: 2 (two) times a day. ) .  cyclobenzaprine (FLEXERIL) 10 MG tablet, Take 1/2 to 1 tablet 2 to 3 x /day as needed for muscle spasm .  finasteride (PROSCAR) 5 MG tablet, Take 1 tablet daily for Prostate .  fluorouracil (EFUDEX) 5 % cream, Apply to suspect areas 2 x / day til skin raw (Patient taking differently: as needed. Apply to  suspect areas 2 x / day til skin raw) .  neomycin-polymyxin b-dexamethasone (MAXITROL) 3.5-10000-0.1 SUSP, 1 to 2 drops to affected eye every 1 to 2 hours today, then 4 x /day  Problem list He has Hypertension; Hyperlipidemia; Testosterone deficiency; Vitamin D deficiency; Depression, major, in remission ([place]); Abnormal glucose; Morbid obesity (BMI 37.55); Medication management; Paroxysmal A-fib ([place]); Abdominal aortic aneurysm ([place]); Prostatism; and Low back pain on their problem list.   Observations/Objective:  BP  156/104   P56   T 97.8 F   R 16   Ht 6'   Wt 280 lb    BMI 37.99  HEENT - WNL. Neck - supple.  Chest - Clear equal BS. Cor - Nl HS. RRR w/o sig MGR. PP 2(+). 1+ pretibial edema. MS- FROM w/o deformities.  Gait Nl. Neuro -  Nl w/o focal abnormalities.    Assessment and Plan:  1. Labile hypertension  - D/C  HCTZ - torsemide  20 MG tablet; Take 1 tablet every morning - Disp: 90 tablet; Rf: 3  - amLODipine 10 MG tablet; Take 1 tablet every morning -  Disp: 90 tablet; Rf: 3       I discussed the assessment and treatment plan with the patient. The patient was provided an opportunity to ask questions and all were answered. The patient agreed with the plan and  demonstrated an understanding of the instructions.       The patient was advised to call back or seek an in-person evaluation if the symptoms worsen or if the condition fails to improve as anticipated.   Kirtland Bouchard, MD

## 2019-07-20 ENCOUNTER — Encounter: Payer: Self-pay | Admitting: Internal Medicine

## 2019-07-20 ENCOUNTER — Other Ambulatory Visit: Payer: Self-pay | Admitting: Internal Medicine

## 2019-07-23 ENCOUNTER — Other Ambulatory Visit: Payer: Self-pay | Admitting: Internal Medicine

## 2019-07-23 MED ORDER — MONTELUKAST SODIUM 10 MG PO TABS: ORAL_TABLET | ORAL | 3 refills | Status: AC

## 2019-07-31 ENCOUNTER — Ambulatory Visit: Payer: Managed Care, Other (non HMO) | Admitting: Internal Medicine

## 2019-07-31 ENCOUNTER — Other Ambulatory Visit: Payer: Self-pay

## 2019-07-31 VITALS — BP 118/76 | HR 64 | Temp 97.2°F | Resp 16 | Ht 72.0 in | Wt 282.2 lb

## 2019-07-31 DIAGNOSIS — R0989 Other specified symptoms and signs involving the circulatory and respiratory systems: Secondary | ICD-10-CM | POA: Diagnosis not present

## 2019-07-31 DIAGNOSIS — Z79899 Other long term (current) drug therapy: Secondary | ICD-10-CM

## 2019-07-31 NOTE — Progress Notes (Signed)
   Subjective:    Patient ID: Nicholas Crosby, male    DOB: 1966-01-01, 54 y.o.   MRN: [id number]  HPI   This very nice 54 yo [name] with very labile HTN since age 72 yo / 30 on a multi drug regimen of # 7  meds with suboptimal control returns for 2 week f/u . He reports BP's have been mostkly normal since last OV.       In Mar 2021, he had a negative / Normal Renal Artery U/S. Renal Functions in Jan 27 showed Nl BUN/Creatine and Normal GFR 80 . U/A with benign sediment.   Medication Sig  . amLODipine (NORVASC) 10 MG tablet Take 1 tablet every morning for BP  . aspirin 81 MG tablet Take 81 mg by mouth daily.  Marland Kitchen atenolol (TENORMIN) 100 MG tablet TAKE 1 TABLET BY MOUTH DAILY  . atorvastatin (LIPITOR) 40 MG tablet Take 1 tablet Daily for Cholestrol  . VITAMIN D 5000 units TAKE 1 CAPSULE 2 x /day = 10,000 u /day  . cyclobenzaprine (FLEXERIL) 10 MG tablet Take 1/2 to 1 tablet 2 to 3 x /day as needed for muscle spasm  . doxazosin (CARDURA) 4 MG tablet Take 1 tablet at Bedtime for BP & Prostate  . finasteride (PROSCAR) 5 MG tablet Take 1 tablet daily for Prostate  . fluorouracil (EFUDEX) 5 % cream Apply to suspect areas 2 x / day til skin raw   . minoxidil (LONITEN) 10 MG tablet Take 1 to 2 tablets  Daily for BP  . montelukast (SINGULAIR) 10 MG tablet Take 1 tablet daily for Allergies & Asthma  . olmesartan (BENICAR) 40 MG tablet Take 1 tablet Daily for BP  . testosterone cypio 200 MG/ML injec Inject 2cc IM q 2 weeks   . torsemide (DEMADEX) 20 MG tablet Take 1 tablet every morning for BP & Fluid Retention  . verapamil (CALAN-SR) 240 MG CR tablet TAKE 1 TABLET BY MOUTH DAILY WITH FOOD FOR BLOOD PRESSURE  . MAXITROLSUSP 1 to 2 drops to affected eye every 1 to 2 hours today, then 4 x /day    Allergies  Allergen Reactions  . Zocor [Simvastatin] Other (See Comments)    cpk elev  . Crestor [Rosuvastatin] Palpitations   Past Medical History:  Diagnosis Date  . Depression   . Hyperlipidemia   .  Hypertension   . Prostate enlargement    Review of Systems   10 point systems review negative except as above.    Objective:   Physical Exam  BP 118/76   Pulse 64   Temp (!) 97.2 F (36.2 C)   Resp 16   Ht 6' (1.829 m)   Wt 282 lb 3.2 oz (128 kg)   BMI 38.27 kg/m   HEENT - WNL. Neck - supple.  Chest - Clear equal BS. Cor - Nl HS. RRR w/o sig MGR. PP 1(+). No edema. MS- FROM w/o deformities.  Gait Nl. Neuro -  Nl w/o focal abnormalities.    Assessment & Plan:   1. Labile hypertension  - COMPLETE METABOLIC PANEL WITH GFR  2. Medication management  - COMPLETE METABOLIC PANEL WITH GFR  - Discussed meds & SE's.

## 2019-08-01 LAB — COMPLETE METABOLIC PANEL WITH GFR
AG Ratio: 1.6 (calc) (ref 1.0–2.5)
ALT: 29 U/L (ref 9–46)
AST: 17 U/L (ref 10–35)
Albumin: 3.9 g/dL (ref 3.6–5.1)
Alkaline phosphatase (APISO): 36 U/L (ref 35–144)
BUN: 14 mg/dL (ref 7–25)
CO2: 31 mmol/L (ref 20–32)
Calcium: 8.8 mg/dL (ref 8.6–10.3)
Chloride: 102 mmol/L (ref 98–110)
Creat: 1.09 mg/dL (ref 0.70–1.33)
GFR, Est African American: 90 mL/min/{1.73_m2} (ref >=60)
GFR, Est Non African American: 78 mL/min/{1.73_m2} (ref >=60)
Globulin: 2.5 g/dL (calc) (ref 1.9–3.7)
Glucose, Bld: 99 mg/dL (ref 65–99)
Potassium: 4 mmol/L (ref 3.5–5.3)
Sodium: 140 mmol/L (ref 135–146)
Total Bilirubin: 0.6 mg/dL (ref 0.2–1.2)
Total Protein: 6.4 g/dL (ref 6.1–8.1)

## 2019-08-04 ENCOUNTER — Encounter: Payer: Self-pay | Admitting: Internal Medicine

## 2019-08-15 ENCOUNTER — Other Ambulatory Visit: Payer: Self-pay | Admitting: *Deleted

## 2019-08-15 MED ORDER — ATENOLOL 100 MG PO TABS: ORAL_TABLET | ORAL | 3 refills | Status: DC

## 2019-08-16 ENCOUNTER — Other Ambulatory Visit: Payer: Self-pay | Admitting: Internal Medicine

## 2019-08-16 DIAGNOSIS — R0989 Other specified symptoms and signs involving the circulatory and respiratory systems: Secondary | ICD-10-CM

## 2019-08-16 DIAGNOSIS — E782 Mixed hyperlipidemia: Secondary | ICD-10-CM

## 2019-08-16 MED ORDER — ATORVASTATIN CALCIUM 40 MG PO TABS: ORAL_TABLET | ORAL | 3 refills | Status: AC

## 2019-08-16 MED ORDER — AMLODIPINE BESYLATE 10 MG PO TABS: ORAL_TABLET | ORAL | 3 refills | Status: DC

## 2019-08-16 MED ORDER — MINOXIDIL 10 MG PO TABS: ORAL_TABLET | ORAL | 3 refills | Status: DC

## 2019-08-16 MED ORDER — TORSEMIDE 20 MG PO TABS: ORAL_TABLET | ORAL | 3 refills | Status: DC

## 2019-08-18 ENCOUNTER — Other Ambulatory Visit: Payer: Self-pay | Admitting: Internal Medicine

## 2019-08-18 DIAGNOSIS — I10 Essential (primary) hypertension: Secondary | ICD-10-CM

## 2019-08-18 MED ORDER — ATENOLOL 100 MG PO TABS: ORAL_TABLET | ORAL | 3 refills | Status: DC

## 2019-08-27 ENCOUNTER — Other Ambulatory Visit: Payer: Self-pay | Admitting: Internal Medicine

## 2019-08-27 DIAGNOSIS — I10 Essential (primary) hypertension: Secondary | ICD-10-CM

## 2019-08-28 NOTE — Progress Notes (Signed)
FOLLOW UP  Assessment and Plan:   Atherosclerosis of aorta Per CT 04/2016 Control blood pressure, cholesterol, glucose, increase exercise.   Paroxysmal Atrial Fibrillation CHADSVASC2 of 1 - has declined anticoagulation other than ASA and referral to cardiology Rate controlled - reportedly only seems to have with rare excessive ETOH consumption.   Hypertension Labile but fair control on home logs; continue medications Monitor blood pressure at home; patient to call if consistently greater than 130/80 Continue DASH diet.   Reminder to go to the ER if any CP, SOB, nausea, dizziness, severe HA, changes vision/speech, left arm numbness and tingling and jaw pain.  Cholesterol Currently remains  above goal of 100 Atorvastatin 20 mg every other day, consider increasing to daily as he works on weight loss, doing keto Continue low cholesterol diet and exercise.  Check lipid panel.   Abnormal glucose Continue diet and exercise.  Perform daily foot/skin check, notify office of any concerning changes.  Check A1C q56m, defer today   Morbid Obesity - BMI 37 with co morbidities Long discussion about weight loss, diet, and exercise Recommended diet heavy in fruits and veggies and low in animal meats, cheeses, and dairy products, appropriate calorie intake Discussed ideal weight for height  Patient will work on increasing exercise, continue with low carb diet - discussed need for high fiber, add supplement, then will transition to plant based minimally processed diet Will follow up in 3 months  Vitamin D Def At goal at last visit; continue supplementation to maintain goal of 60-100 Defer Vit D level  Hypogonadism - continue to monitor, states medication is helping with symptoms of low T.    Continue diet and meds as discussed. Further disposition pending results of labs. Discussed med's effects and SE's.   Over 30 minutes of exam, counseling, chart review, and critical decision making was  performed.   Future Appointments  Date Time Provider [place]  12/02/2019  9:30 AM Unk Pinto, MD [facility] None  06/01/2020  3:00 PM Unk Pinto, MD [facility] None    ----------------------------------------------------------------------------------------------------------------------  HPI 54 y.o. male  presents for 3 month follow up on hypertension, cholesterol, prediabetes, obesity and vitamin D deficiency.   He also has dx of paroxysmal a. Fib with CHADSVASC2 of 1 - risks have been discussed, he is on ASA, has declined anticoagulation after discussion of risks.  Rate controlled - reportedly only seems to have with rare excessive ETOH consumption.   BMI is Body mass index is 37.73 kg/m., he has been working on diet and exercise. 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 He is watching diet for carbs and sugars. He has been doing some intermittent fasting (18 hours) and keto. Wt Readings from Last 3 Encounters:  08/29/19 278 lb 3.2 oz (126.2 kg)  07/31/19 282 lb 3.2 oz (128 kg)  07/17/19 280 lb 1.6 oz (127.1 kg)   He has aortic atherosclerosis per CT abd 04/2016 His blood pressure has been labile at home (120/70-rare up to 150/80s, typically 130s/low 80s), today their BP is BP: 132/80 hasn't taken torsemide yet today   He does workout. He denies chest pain, shortness of breath, dizziness.    He is on cholesterol medication (atorvastatin 20 mg every other day ) and denies myalgias. His cholesterol is not at goal. The cholesterol last visit was:   Lab Results  Component Value Date   CHOL 197 05/15/2019   HDL 39 (L) 05/15/2019  LDLCALC 116 (H) 05/15/2019   TRIG 292 (H) 05/15/2019   CHOLHDL 5.1 (H) 05/15/2019    He has been working on diet and exercise for prediabetes, and denies increased appetite, nausea, paresthesia of the feet, polydipsia, polyuria, visual disturbances, vomiting and weight loss. Last A1C in  the office was:  Lab Results  Component Value Date   HGBA1C 5.9 (H) 05/15/2019   Patient is on Vitamin D supplement:    Lab Results  Component Value Date   VD25OH 69 05/15/2019     He has a history of testosterone deficiency and is on testosterone replacement, due for shot today, just got, doing every 2 weeks. He states that the testosterone helps with his energy, libido, muscle mass. Lab Results  Component Value Date   TESTOSTERONE 183 (L) 05/15/2019     Current Medications:  Current Outpatient Medications on File Prior to Visit  Medication Sig  . amLODipine (NORVASC) 10 MG tablet Take 1 tablet every morning for BP  . aspirin 81 MG tablet Take 81 mg by mouth daily.  Marland Kitchen atenolol (TENORMIN) 100 MG tablet Take 1 tablet Daily for blood pressure.  Marland Kitchen atorvastatin (LIPITOR) 40 MG tablet Take 1 tablet Daily for Cholestrol  . Cholecalciferol (VITAMIN D3) 5000 units CAPS TAKE 1 CAPSULE BY MOUTH DAILY (Patient taking differently: 2 (two) times a day. )  . cyclobenzaprine (FLEXERIL) 10 MG tablet Take 1/2 to 1 tablet 2 to 3 x /day as needed for muscle spasm  . doxazosin (CARDURA) 4 MG tablet Take 1 tablet at Bedtime for BP & Prostate  . finasteride (PROSCAR) 5 MG tablet Take 1 tablet daily for Prostate  . fluorouracil (EFUDEX) 5 % cream Apply to suspect areas 2 x / day til skin raw (Patient taking differently: as needed. Apply to suspect areas 2 x / day til skin raw)  . minoxidil (LONITEN) 10 MG tablet Take 1 to 2 tablets  Daily for BP  . montelukast (SINGULAIR) 10 MG tablet Take 1 tablet daily for Allergies & Asthma  . olmesartan (BENICAR) 40 MG tablet Take 1 tablet Daily for BP  . testosterone cypionate (DEPOTESTOSTERONE CYPIONATE) 200 MG/ML injection Inject 2cc IM q 2 weeks and needs 3 cc syringes &  1" 21 G needles  . torsemide (DEMADEX) 20 MG tablet Take 1 tablet every morning for BP & Fluid Retention  . verapamil (CALAN-SR) 240 MG CR tablet TAKE 1 TABLET BY MOUTH DAILY WITH FOOD FOR BLOOD  PRESSURE   No current facility-administered medications on file prior to visit.     Allergies:  Allergies  Allergen Reactions  . Zocor [Simvastatin] Other (See Comments)    cpk elev  . Crestor [Rosuvastatin] Palpitations     Medical History:  Past Medical History:  Diagnosis Date  . Depression   . Hyperlipidemia   . Hypertension   . Prostate enlargement    Family history- Reviewed and unchanged Social history- Reviewed and unchanged   Review of Systems:  Review of Systems  Constitutional: Negative for malaise/fatigue and weight loss.  HENT: Negative for hearing loss and tinnitus.   Eyes: Negative for blurred vision and double vision.  Respiratory: Negative for cough, shortness of breath and wheezing.   Cardiovascular: Negative for chest pain, palpitations, orthopnea, claudication and leg swelling.  Gastrointestinal: Negative for abdominal pain, blood in stool, constipation, diarrhea, heartburn, melena, nausea and vomiting.  Genitourinary: Positive for frequency. Negative for dysuria, flank pain, hematuria and urgency.  Musculoskeletal: Negative for joint pain and myalgias.  Skin: Negative for rash.  Neurological: Negative for dizziness, tingling, sensory change, weakness and headaches.  Endo/Heme/Allergies: Negative for polydipsia.  Psychiatric/Behavioral: Negative.   All other systems reviewed and are negative.   Physical Exam: BP 132/80   Pulse 60   Temp (!) 97.4 F (36.3 C)   Resp 16   Wt 278 lb 3.2 oz (126.2 kg)   SpO2 97%   BMI 37.73 kg/m  Wt Readings from Last 3 Encounters:  08/29/19 278 lb 3.2 oz (126.2 kg)  07/31/19 282 lb 3.2 oz (128 kg)  07/17/19 280 lb 1.6 oz (127.1 kg)   General Appearance: Well nourished, in no apparent distress. Eyes: PERRLA, EOMs, conjunctiva no swelling or erythema Sinuses: No Frontal/maxillary tenderness ENT/Mouth: Ext aud canals clear, TMs without erythema, bulging. No erythema, swelling, or exudate on post pharynx.   Tonsils not swollen or erythematous. Hearing normal.  Neck: Supple, thyroid normal.  Respiratory: Respiratory effort normal, BS equal bilaterally without rales, rhonchi, wheezing or stridor.  Cardio: RRR with no MRGs. Brisk peripheral pulses without edema.  Abdomen: Soft, + BS.  Non tender, no guarding, rebound, hernias, masses. Lymphatics: Non tender without lymphadenopathy.  Musculoskeletal: Full ROM, 5/5 strength, Normal gait Skin: Warm, dry without rashes, lesions, ecchymosis.  Neuro: Cranial nerves intact. No cerebellar symptoms.  Psych: Awake and oriented X 3, normal affect, Insight and Judgment appropriate.    Izora Ribas, NP 9:52 AM [facility]

## 2019-08-29 ENCOUNTER — Encounter: Payer: Self-pay | Admitting: Adult Health

## 2019-08-29 ENCOUNTER — Other Ambulatory Visit: Payer: Self-pay

## 2019-08-29 ENCOUNTER — Ambulatory Visit: Payer: Managed Care, Other (non HMO) | Admitting: Adult Health

## 2019-08-29 VITALS — BP 132/80 | HR 60 | Temp 97.4°F | Resp 16 | Wt 278.2 lb

## 2019-08-29 DIAGNOSIS — I700 Atherosclerosis of aorta: Secondary | ICD-10-CM | POA: Diagnosis not present

## 2019-08-29 DIAGNOSIS — E559 Vitamin D deficiency, unspecified: Secondary | ICD-10-CM

## 2019-08-29 DIAGNOSIS — E349 Endocrine disorder, unspecified: Secondary | ICD-10-CM

## 2019-08-29 DIAGNOSIS — I480 Paroxysmal atrial fibrillation: Secondary | ICD-10-CM

## 2019-08-29 DIAGNOSIS — F325 Major depressive disorder, single episode, in full remission: Secondary | ICD-10-CM

## 2019-08-29 DIAGNOSIS — Z79899 Other long term (current) drug therapy: Secondary | ICD-10-CM

## 2019-08-29 DIAGNOSIS — R7309 Other abnormal glucose: Secondary | ICD-10-CM

## 2019-08-29 DIAGNOSIS — E785 Hyperlipidemia, unspecified: Secondary | ICD-10-CM | POA: Diagnosis not present

## 2019-08-29 DIAGNOSIS — I10 Essential (primary) hypertension: Secondary | ICD-10-CM

## 2019-08-29 NOTE — Patient Instructions (Addendum)
Goals    . Blood Pressure < 130/80    . LDL CALC < 100    . Weight (lb) < 250 lb (113.4 kg)      Good sources of fiber for keto - green leafy veggies, cruciferous veggies, seeds (chia, flax, etc)   Citrucel/benefiber supplement     High-Fiber Diet Fiber, also called dietary fiber, is a type of carbohydrate that is found in fruits, vegetables, whole grains, and beans. A high-fiber diet can have many health benefits. Your health care provider may recommend a high-fiber diet to help:  Prevent constipation. Fiber can make your bowel movements more regular.  Lower your cholesterol.  Relieve the following conditions: ? Swelling of veins in the anus (hemorrhoids). ? Swelling and irritation (inflammation) of specific areas of the digestive tract (uncomplicated diverticulosis). ? A problem of the large intestine (colon) that sometimes causes pain and diarrhea (irritable bowel syndrome, IBS).  Prevent overeating as part of a weight-loss plan.  Prevent heart disease, type 2 diabetes, and certain cancers. What is my plan? The recommended daily fiber intake in grams (g) includes:  38 g for men age 63 or younger.  30 g for men over age 32.  71 g for women age 54 or younger.  21 g for women over age 70. You can get the recommended daily intake of dietary fiber by:  Eating a variety of fruits, vegetables, grains, and beans.  Taking a fiber supplement, if it is not possible to get enough fiber through your diet. What do I need to know about a high-fiber diet?  It is better to get fiber through food sources rather than from fiber supplements. There is not a lot of research about how effective supplements are.  Always check the fiber content on the nutrition facts label of any prepackaged food. Look for foods that contain 5 g of fiber or more per serving.  Talk with a diet and nutrition specialist (dietitian) if you have questions about specific foods that are recommended or not  recommended for your medical condition, especially if those foods are not listed below.  Gradually increase how much fiber you consume. If you increase your intake of dietary fiber too quickly, you may have bloating, cramping, or gas.  Drink plenty of water. Water helps you to digest fiber. What are tips for following this plan?  Eat a wide variety of high-fiber foods.  Make sure that half of the grains that you eat each day are whole grains.  Eat breads and cereals that are made with whole-grain flour instead of refined flour or white flour.  Eat brown rice, bulgur wheat, or millet instead of white rice.  Start the day with a breakfast that is high in fiber, such as a cereal that contains 5 g of fiber or more per serving.  Use beans in place of meat in soups, salads, and pasta dishes.  Eat high-fiber snacks, such as berries, raw vegetables, nuts, and popcorn.  Choose whole fruits and vegetables instead of processed forms like juice or sauce. What foods can I eat?  Fruits Berries. Pears. Apples. Oranges. Avocado. Prunes and raisins. Dried figs. Vegetables Sweet potatoes. Spinach. Kale. Artichokes. Cabbage. Broccoli. Cauliflower. Green peas. Carrots. Squash. Grains Whole-grain breads. Multigrain cereal. Oats and oatmeal. Brown rice. Barley. Bulgur wheat. [place]. Quinoa. Bran muffins. Popcorn. Rye wafer crackers. Meats and other proteins [place], kidney, and pinto beans. Soybeans. Split peas. Lentils. Nuts and seeds. Dairy Fiber-fortified yogurt. Beverages Fiber-fortified soy milk. Fiber-fortified orange  juice. Other foods Fiber bars. The items listed above may not be a complete list of recommended foods and beverages. Contact a dietitian for more options. What foods are not recommended? Fruits Fruit juice. Cooked, strained fruit. Vegetables Fried potatoes. Canned vegetables. Well-cooked vegetables. Grains White bread. Pasta made with refined flour. White rice. Meats and  other proteins Fatty cuts of meat. Fried chicken or fried fish. Dairy Milk. Yogurt. Cream cheese. Sour cream. Fats and oils Butters. Beverages Soft drinks. Other foods Cakes and pastries. The items listed above may not be a complete list of foods and beverages to avoid. Contact a dietitian for more information. Summary  Fiber is a type of carbohydrate. It is found in fruits, vegetables, whole grains, and beans.  There are many health benefits of eating a high-fiber diet, such as preventing constipation, lowering blood cholesterol, helping with weight loss, and reducing your risk of heart disease, diabetes, and certain cancers.  Gradually increase your intake of fiber. Increasing too fast can result in cramping, bloating, and gas. Drink plenty of water while you increase your fiber.  The best sources of fiber include whole fruits and vegetables, whole grains, nuts, seeds, and beans. This information is not intended to replace advice given to you by your health care provider. Make sure you discuss any questions you have with your health care provider. Document Revised: 02/06/2017 Document Reviewed: 02/06/2017 Elsevier Patient Education  2020 [employer].

## 2019-08-30 LAB — COMPLETE METABOLIC PANEL WITH GFR
AG Ratio: 1.9 (calc) (ref 1.0–2.5)
ALT: 29 U/L (ref 9–46)
AST: 19 U/L (ref 10–35)
Albumin: 4.6 g/dL (ref 3.6–5.1)
Alkaline phosphatase (APISO): 37 U/L (ref 35–144)
BUN: 21 mg/dL (ref 7–25)
CO2: 28 mmol/L (ref 20–32)
Calcium: 9 mg/dL (ref 8.6–10.3)
Chloride: 104 mmol/L (ref 98–110)
Creat: 0.88 mg/dL (ref 0.70–1.33)
GFR, Est African American: 114 mL/min/{1.73_m2} (ref >=60)
GFR, Est Non African American: 99 mL/min/{1.73_m2} (ref >=60)
Globulin: 2.4 g/dL (calc) (ref 1.9–3.7)
Glucose, Bld: 107 mg/dL — ABNORMAL HIGH (ref 65–99)
Potassium: 4.4 mmol/L (ref 3.5–5.3)
Sodium: 138 mmol/L (ref 135–146)
Total Bilirubin: 0.4 mg/dL (ref 0.2–1.2)
Total Protein: 7 g/dL (ref 6.1–8.1)

## 2019-08-30 LAB — CBC WITH DIFFERENTIAL/PLATELET
Absolute Monocytes: 496 cells/uL (ref 200–950)
Basophils Absolute: 31 cells/uL (ref 0–200)
Basophils Relative: 0.5 %
Eosinophils Absolute: 62 cells/uL (ref 15–500)
Eosinophils Relative: 1 %
HCT: 39.7 % (ref 38.5–50.0)
Hemoglobin: 13.2 g/dL (ref 13.2–17.1)
Lymphs Abs: 1401 cells/uL (ref 850–3900)
MCH: 27 pg (ref 27.0–33.0)
MCHC: 33.2 g/dL (ref 32.0–36.0)
MCV: 81.2 fL (ref 80.0–100.0)
MPV: 11.4 fL (ref 7.5–12.5)
Monocytes Relative: 8 %
Neutro Abs: 4210 cells/uL (ref 1500–7800)
Neutrophils Relative %: 67.9 %
Platelets: 234 10*3/uL (ref 140–400)
RBC: 4.89 10*6/uL (ref 4.20–5.80)
RDW: 14.5 % (ref 11.0–15.0)
Total Lymphocyte: 22.6 %
WBC: 6.2 10*3/uL (ref 3.8–10.8)

## 2019-08-30 LAB — LIPID PANEL
Cholesterol: 193 mg/dL (ref <200)
HDL: 40 mg/dL (ref >=40)
LDL Cholesterol (Calc): 124 mg/dL (calc) — ABNORMAL HIGH
Non-HDL Cholesterol (Calc): 153 mg/dL (calc) — ABNORMAL HIGH (ref <130)
Total CHOL/HDL Ratio: 4.8 (calc) (ref <5.0)
Triglycerides: 174 mg/dL — ABNORMAL HIGH (ref <150)

## 2019-08-30 LAB — TSH: TSH: 1.43 mIU/L (ref 0.40–4.50)

## 2019-08-30 LAB — MAGNESIUM: Magnesium: 2.3 mg/dL (ref 1.5–2.5)

## 2019-09-03 ENCOUNTER — Other Ambulatory Visit: Payer: Self-pay | Admitting: Internal Medicine

## 2019-09-03 DIAGNOSIS — I10 Essential (primary) hypertension: Secondary | ICD-10-CM

## 2019-09-03 MED ORDER — VERAPAMIL HCL ER 240 MG PO TBCR: EXTENDED_RELEASE_TABLET | ORAL | 0 refills | Status: DC

## 2019-10-11 ENCOUNTER — Other Ambulatory Visit: Payer: Self-pay | Admitting: Internal Medicine

## 2019-10-11 DIAGNOSIS — I10 Essential (primary) hypertension: Secondary | ICD-10-CM

## 2019-10-11 MED ORDER — VERAPAMIL HCL ER 240 MG PO TBCR: EXTENDED_RELEASE_TABLET | ORAL | 0 refills | Status: DC

## 2019-10-27 ENCOUNTER — Other Ambulatory Visit: Payer: Self-pay

## 2019-10-27 DIAGNOSIS — I10 Essential (primary) hypertension: Secondary | ICD-10-CM

## 2019-10-27 DIAGNOSIS — E349 Endocrine disorder, unspecified: Secondary | ICD-10-CM

## 2019-10-27 MED ORDER — TESTOSTERONE CYPIONATE 200 MG/ML IM SOLN: INTRAMUSCULAR | 2 refills | Status: DC

## 2019-10-27 MED ORDER — VERAPAMIL HCL ER 240 MG PO TBCR: EXTENDED_RELEASE_TABLET | ORAL | 3 refills | Status: DC

## 2019-11-07 ENCOUNTER — Other Ambulatory Visit: Payer: Self-pay | Admitting: Internal Medicine

## 2019-11-07 DIAGNOSIS — I10 Essential (primary) hypertension: Secondary | ICD-10-CM

## 2019-11-07 DIAGNOSIS — Z79899 Other long term (current) drug therapy: Secondary | ICD-10-CM

## 2019-11-10 NOTE — Progress Notes (Signed)
   History of Present Illness:      Patient is a very nice 54 yo [name] with very resistant HTN predating since age 58 yo (1992) and is currently on #7 meds for BP with sub-optimal control. Patient had a negative /Nl Renal Artery U/S in March 2021. Numerous sporadic BP's from different times of day reviewed and range sys from 120's - 180's and dias from 70-80's.   Medications  .  DEPO-TESTOSTERONE 200 MG/ML injection, Inject 2cc IM q 2 weeks  .  amLODipine  10 MG tablet, Take 1 tablet every morning for BP .  atenolol  100 MG tablet, Take 1 tablet Daily for blood pressure. Marland Kitchen  atorvastatin 40 MG tablet, Take 1 tablet Daily for Cholestrol .  doxazosin 4 MG tablet, Take 1 tablet at Bedtime for BP & Prostate .  minoxidil  10 MG tablet, Take 1 to 2 tablets  Daily for BP .  olmesartan 40 MG tablet, Take 1 tablet Daily for BP .  torsemide  20 MG tablet, Take 1 tablet every morning for BP & Fluid Retention .  verapamil-SR 240 MG CR tablet, Take 1 tablet Daily with Food for BP .  montelukast  10 MG tablet, Take 1 tablet daily for Allergies & Asthma .  aspirin 81 MG tablet, Take  daily. Marland Kitchen VITAMIN D 5000 units CAPS, TAKE 2 CAPSULE  DAILY  .  cyclobenzaprine  10 MG tablet, Take 1/2 to 1 tablet 2 to 3 x /day as needed for muscle spasm .  finasteride  5 MG tablet, Take 1 tablet daily for Prostate .  fluorouracil  5 % cream, Apply to suspect areas 2 x / day til skin raw  Problem list He has Hypertension; Hyperlipidemia; Testosterone deficiency; Vitamin D deficiency; Depression, major, in remission ([place]); Abnormal glucose; Morbid obesity ([place]) ; Medication management; Paroxysmal A-fib ([place]); Aortic atherosclerosis ([place]); Prostatism; and Low back pain on their problem list.   Observations/Objective:  BP (!) 142/84   Pulse 60   Temp (!) 97.2 F (36.2 C)   Resp 16   Ht 6' (1.829 m)   Wt (!) 280 lb 3.2 oz (127.1 kg)   BMI 38.00 kg/m   HEENT - WNL. Neck - supple.  Chest - Clear equal BS. Cor - Nl HS.  RRR w/o sig MGR. PP 1(+). No edema. MS- FROM w/o deformities.  Gait Nl. Neuro -  Nl w/o focal abnormalities.  Assessment and Plan:  1. Resistant hypertension  - Check 5 HIAA, quantitative,  Catecholamines/ frac,  Aldosterone/Renin,  Metanephrines,  CBC / Diff,  CMET,  TSH       I discussed the assessment and treatment plan with the patient. The patient was provided an opportunity to ask questions and all were answered. The patient agreed with the plan and demonstrated an understanding of the instructions.  Kirtland Bouchard, MD

## 2019-11-11 ENCOUNTER — Other Ambulatory Visit: Payer: Self-pay

## 2019-11-11 ENCOUNTER — Encounter: Payer: Self-pay | Admitting: Internal Medicine

## 2019-11-11 ENCOUNTER — Ambulatory Visit: Payer: Managed Care, Other (non HMO) | Admitting: Internal Medicine

## 2019-11-11 ENCOUNTER — Other Ambulatory Visit: Payer: Self-pay | Admitting: Internal Medicine

## 2019-11-11 VITALS — BP 142/84 | HR 60 | Temp 97.2°F | Resp 16 | Ht 72.0 in | Wt 280.2 lb

## 2019-11-11 DIAGNOSIS — I10 Essential (primary) hypertension: Secondary | ICD-10-CM | POA: Diagnosis not present

## 2019-11-11 DIAGNOSIS — Z79899 Other long term (current) drug therapy: Secondary | ICD-10-CM

## 2019-11-11 MED ORDER — DEXAMETHASONE 4 MG PO TABS: ORAL_TABLET | ORAL | 0 refills | Status: DC

## 2019-11-11 MED ORDER — DICLOFENAC SODIUM 1 % EX GEL: CUTANEOUS | 3 refills | Status: AC

## 2019-11-11 NOTE — Patient Instructions (Signed)

## 2019-11-16 LAB — COMPLETE METABOLIC PANEL WITH GFR
AG Ratio: 1.6 (calc) (ref 1.0–2.5)
ALT: 23 U/L (ref 9–46)
AST: 18 U/L (ref 10–35)
Albumin: 4.4 g/dL (ref 3.6–5.1)
Alkaline phosphatase (APISO): 46 U/L (ref 35–144)
BUN: 18 mg/dL (ref 7–25)
CO2: 29 mmol/L (ref 20–32)
Calcium: 9.2 mg/dL (ref 8.6–10.3)
Chloride: 103 mmol/L (ref 98–110)
Creat: 1 mg/dL (ref 0.70–1.33)
GFR, Est African American: 100 mL/min/{1.73_m2} (ref >=60)
GFR, Est Non African American: 86 mL/min/{1.73_m2} (ref >=60)
Globulin: 2.7 g/dL (calc) (ref 1.9–3.7)
Glucose, Bld: 83 mg/dL (ref 65–99)
Potassium: 4.3 mmol/L (ref 3.5–5.3)
Sodium: 140 mmol/L (ref 135–146)
Total Bilirubin: 0.5 mg/dL (ref 0.2–1.2)
Total Protein: 7.1 g/dL (ref 6.1–8.1)

## 2019-11-16 LAB — CBC WITH DIFFERENTIAL/PLATELET
Absolute Monocytes: 644 cells/uL (ref 200–950)
Basophils Absolute: 49 cells/uL (ref 0–200)
Basophils Relative: 0.7 %
Eosinophils Absolute: 161 cells/uL (ref 15–500)
Eosinophils Relative: 2.3 %
HCT: 40.6 % (ref 38.5–50.0)
Hemoglobin: 13.9 g/dL (ref 13.2–17.1)
Lymphs Abs: 1680 cells/uL (ref 850–3900)
MCH: 28.1 pg (ref 27.0–33.0)
MCHC: 34.2 g/dL (ref 32.0–36.0)
MCV: 82.2 fL (ref 80.0–100.0)
MPV: 10.6 fL (ref 7.5–12.5)
Monocytes Relative: 9.2 %
Neutro Abs: 4466 cells/uL (ref 1500–7800)
Neutrophils Relative %: 63.8 %
Platelets: 258 10*3/uL (ref 140–400)
RBC: 4.94 10*6/uL (ref 4.20–5.80)
RDW: 13.6 % (ref 11.0–15.0)
Total Lymphocyte: 24 %
WBC: 7 10*3/uL (ref 3.8–10.8)

## 2019-11-16 LAB — ALDOSTERONE + RENIN ACTIVITY W/ RATIO
ALDO / PRA Ratio: 19.2 Ratio (ref 0.9–28.9)
Aldosterone: 5 ng/dL
Renin Activity: 0.26 ng/mL/h (ref 0.25–5.82)

## 2019-11-16 LAB — TSH: TSH: 1.38 mIU/L (ref 0.40–4.50)

## 2019-11-16 LAB — METANEPHRINES, PLASMA
Metanephrine, Free: <25 pg/mL (ref <=57)
Normetanephrine, Free: 39 pg/mL (ref <=148)
Total Metanephrines-Plasma: 39 pg/mL (ref <=205)

## 2019-11-25 LAB — 5 HIAA, QUANTITATIVE, URINE, 24 HOUR
5 HIAA, 24 Hour Urine: 3.9 mg/24 h (ref <=6.0)
Total Volume: 3250

## 2019-11-25 LAB — CATECHOLAMINES, FRACTIONATED, URINE, 24 HOUR
Calc Total (E+NE): 85 mcg/24 h (ref 26–121)
Creatinine, Urine mg/day-CATEUR: 1.91 g/(24.h) (ref 0.50–2.15)
Dopamine 24 Hr Urine: 192 mcg/24 h (ref 52–480)
Epinephrine, 24H, Ur: 18 mcg/24 h (ref 2–24)
Norepinephrine, 24H, Ur: 67 mcg/24 h (ref 15–100)
Total Volume: 3250 mL

## 2019-11-25 NOTE — Progress Notes (Signed)
===========================================================  -   Good News  - All of the hormones that can be associated with High BP returned   Normal & OK ===========================================================

## 2019-11-27 ENCOUNTER — Other Ambulatory Visit: Payer: Self-pay | Admitting: Internal Medicine

## 2019-11-27 DIAGNOSIS — I10 Essential (primary) hypertension: Secondary | ICD-10-CM

## 2019-12-01 ENCOUNTER — Encounter: Payer: Self-pay | Admitting: Internal Medicine

## 2019-12-01 NOTE — Patient Instructions (Signed)

## 2019-12-01 NOTE — Progress Notes (Signed)
History of Present Illness:       This very nice 54 y.o.  [name] presents for 3 month follow up with HTN, HLD, Pre-Diabetes and Vitamin D Deficiency.       Patient is treated for HTN predating since age 77 yo (1993)  & BP has been very labile at home despite on #7 meds.  Patient has had Normal Renal Artery U/S and last month had neg w/u for  Renin / Aldo, Metanephrines / VMA,  Catecholamines and 5-HIAA. Today's BP is elevated at 144/90. Patient has had no complaints of any cardiac type chest pain, palpitations, dyspnea / orthopnea / PND, dizziness, claudication, or dependent edema. Patient relates he has started Weight watchers & has lost 11# over the last several weeks.       Hyperlipidemia is controlled with diet & meds. Patient denies myalgias or other med SE's. Last Lipids were not at goal:  Lab Results  Component Value Date   CHOL 193 08/29/2019   HDL 40 08/29/2019   LDLCALC 124 (H) 08/29/2019   TRIG 174 (H) 08/29/2019   CHOLHDL 4.8 08/29/2019    Also, the patient has history of PreDiabetes and has had no symptoms of reactive hypoglycemia, diabetic polys, paresthesias or visual blurring.  Last A1c was not at goal:  Lab Results  Component Value Date   HGBA1C 5.9 (H) 05/15/2019       Further, the patient also has history of Vitamin D Deficiency and supplements vitamin D without any suspected side-effects. Last vitamin D was at goal:   Lab Results  Component Value Date   VD25OH 69 05/15/2019    Current Outpatient Medications on File Prior to Visit  Medication Sig  . amLODipine (NORVASC) 10 MG tablet Take 1 tablet every morning for BP  . aspirin 81 MG tablet Take 81 mg by mouth daily.  Marland Kitchen atenolol (TENORMIN) 100 MG tablet Take 1 tablet Daily for blood pressure.  Marland Kitchen atorvastatin (LIPITOR) 40 MG tablet Take 1 tablet Daily for Cholestrol  . Cholecalciferol (VITAMIN D3) 5000 units CAPS TAKE 1 CAPSULE BY MOUTH DAILY (Patient taking differently: 2 (two) times a day. )  .  cyclobenzaprine (FLEXERIL) 10 MG tablet Take 1/2 to 1 tablet 2 to 3 x /day as needed for muscle spasm  . diclofenac Sodium (VOLTAREN) 1 % GEL Apply 2 grams 3 to 4 x /day  To inflamed area  . doxazosin (CARDURA) 4 MG tablet Take 1 tablet at Bedtime for BP & Prostate  . finasteride (PROSCAR) 5 MG tablet Take 1 tablet daily for Prostate  . fluorouracil (EFUDEX) 5 % cream Apply to suspect areas 2 x / day til skin raw (Patient taking differently: as needed. Apply to suspect areas 2 x / day til skin raw)  . minoxidil (LONITEN) 10 MG tablet Take 1 to 2 tablets  Daily for BP  . montelukast (SINGULAIR) 10 MG tablet Take 1 tablet daily for Allergies & Asthma  . olmesartan (BENICAR) 40 MG tablet Take 1 tablet Daily for BP  . testosterone cypionate (DEPOTESTOSTERONE CYPIONATE) 200 MG/ML injection Inject 2cc IM q 2 weeks and needs 3 cc syringes &  1" 21 G needles  . torsemide (DEMADEX) 20 MG tablet Take 1 tablet every morning for BP & Fluid Retention  . verapamil (CALAN-SR) 240 MG CR tablet TAKE 1 TABLET DAILY WITH FOOD FOR BP   No current facility-administered medications on file prior to visit.    Allergies  Allergen Reactions  .  Zocor [Simvastatin] Other (See Comments)    cpk elev  . Crestor [Rosuvastatin] Palpitations    PMHx:   Past Medical History:  Diagnosis Date  . Depression   . Hyperlipidemia   . Hypertension   . Prostate enlargement     Immunization History  Administered Date(s) Administered  . Influenza-Unspecified 01/17/2015, 01/16/2017, 08/29/2017, 01/16/2018  . Tdap 01/09/2007, 05/18/2015    Past Surgical History:  Procedure Laterality Date  . COSMETIC SURGERY     liposuction  . CYST REMOVAL NECK    . NO PAST SURGERIES    . VASECTOMY  04/06/2012   Procedure: VASECTOMY;  Surgeon: Alexis Frock, MD;  Location: [facility];  Service: Urology;  Laterality: Bilateral;    FHx:    Reviewed / unchanged  SHx:    Reviewed / unchanged   Systems  Review:  Constitutional: Denies fever, chills, wt changes, headaches, insomnia, fatigue, night sweats, change in appetite. Eyes: Denies redness, blurred vision, diplopia, discharge, itchy, watery eyes.  ENT: Denies discharge, congestion, post nasal drip, epistaxis, sore throat, earache, hearing loss, dental pain, tinnitus, vertigo, sinus pain, snoring.  CV: Denies chest pain, palpitations, irregular heartbeat, syncope, dyspnea, diaphoresis, orthopnea, PND, claudication or edema. Respiratory: denies cough, dyspnea, DOE, pleurisy, hoarseness, laryngitis, wheezing.  Gastrointestinal: Denies dysphagia, odynophagia, heartburn, reflux, water brash, abdominal pain or cramps, nausea, vomiting, bloating, diarrhea, constipation, hematemesis, melena, hematochezia  or hemorrhoids. Genitourinary: Denies dysuria, frequency, urgency, nocturia, hesitancy, discharge, hematuria or flank pain. Musculoskeletal: Denies arthralgias, myalgias, stiffness, jt. swelling, pain, limping or strain/sprain.  Skin: Denies pruritus, rash, hives, warts, acne, eczema or change in skin lesion(s). Neuro: No weakness, tremor, incoordination, spasms, paresthesia or pain. Psychiatric: Denies confusion, memory loss or sensory loss. Endo: Denies change in weight, skin or hair change.  Heme/Lymph: No excessive bleeding, bruising or enlarged lymph nodes.  Physical Exam  BP (!) 144/90   Pulse 60   Temp (!) 97.4 F (36.3 C)   Resp 16   Ht 6' (1.829 m)   Wt 270 lb 12.8 oz (122.8 kg)   BMI 36.73 kg/m   Appears  well nourished, well groomed  and in no distress.  Eyes: PERRLA, EOMs, conjunctiva no swelling or erythema. Sinuses: No frontal/maxillary tenderness ENT/Mouth: EAC's clear, TM's nl w/o erythema, bulging. Nares clear w/o erythema, swelling, exudates. Oropharynx clear without erythema or exudates. Oral hygiene is good. Tongue normal, non obstructing. Hearing intact.  Neck: Supple. Thyroid not palpable. Car 2+/2+ without  bruits, nodes or JVD. Chest: Respirations nl with BS clear & equal w/o rales, rhonchi, wheezing or stridor.  Cor: Heart sounds normal w/ regular rate and rhythm without sig. murmurs, gallops, clicks or rubs. Peripheral pulses normal and equal  without edema.  Abdomen: Soft & bowel sounds normal. Non-tender w/o guarding, rebound, hernias, masses or organomegaly.  Lymphatics: Unremarkable.  Musculoskeletal: Full ROM all peripheral extremities, joint stability, 5/5 strength and normal gait.  Skin: Warm, dry without exposed rashes, lesions or ecchymosis apparent.  Neuro: Cranial nerves intact, reflexes equal bilaterally. Sensory-motor testing grossly intact. Tendon reflexes grossly intact.  Pysch: Alert & oriented x 3.  Insight and judgement nl & appropriate. No ideations.  Assessment and Plan:  1. Essential hypertension  -Discussed Cardio exercises 10-15 minutes 2 x/day  - Continue medication, monitor blood pressure at home.  - Continue DASH diet.  Reminder to go to the ER if any CP,  SOB, nausea, dizziness, severe HA, changes vision/speech.  - Magnesium - TSH  2. Hyperlipidemia, mixed  -  Continue diet/meds, exercise,& lifestyle modifications.  - Continue monitor periodic cholesterol/liver & renal functions   - Lipid panel - TSH  3. Abnormal glucose  - Continue diet, exercise  - Lifestyle modifications.  - Monitor appropriate labs.  - Hemoglobin A1c - Insulin, random  4. Vitamin D deficiency  - Continue supplementation.  - VITAMIN D 25 Hydroxy  5. Testosterone deficiency   6. Medication management  - Magnesium - Lipid panel - TSH - Hemoglobin A1c - Insulin, random - VITAMIN D 25 Hydroxy        Discussed  regular exercise, BP monitoring, weight control to achieve/maintain BMI less than 25 and discussed med and SE's. Recommended labs to assess and monitor clinical status with further disposition pending results of labs.  I discussed the assessment and treatment  plan with the patient. The patient was provided an opportunity to ask questions and all were answered. The patient agreed with the plan and demonstrated an understanding of the instructions.  I provided over 30 minutes of exam, counseling, chart review and  complex critical decision making.   Kirtland Bouchard, MD

## 2019-12-02 ENCOUNTER — Ambulatory Visit: Payer: Managed Care, Other (non HMO) | Admitting: Internal Medicine

## 2019-12-02 ENCOUNTER — Other Ambulatory Visit: Payer: Self-pay

## 2019-12-02 VITALS — BP 144/90 | HR 60 | Temp 97.4°F | Resp 16 | Ht 72.0 in | Wt 270.8 lb

## 2019-12-02 DIAGNOSIS — I10 Essential (primary) hypertension: Secondary | ICD-10-CM

## 2019-12-02 DIAGNOSIS — E349 Endocrine disorder, unspecified: Secondary | ICD-10-CM

## 2019-12-02 DIAGNOSIS — Z79899 Other long term (current) drug therapy: Secondary | ICD-10-CM

## 2019-12-02 DIAGNOSIS — E782 Mixed hyperlipidemia: Secondary | ICD-10-CM

## 2019-12-02 DIAGNOSIS — E559 Vitamin D deficiency, unspecified: Secondary | ICD-10-CM | POA: Diagnosis not present

## 2019-12-02 DIAGNOSIS — R7309 Other abnormal glucose: Secondary | ICD-10-CM | POA: Diagnosis not present

## 2019-12-03 LAB — MAGNESIUM: Magnesium: 2.2 mg/dL (ref 1.5–2.5)

## 2019-12-03 LAB — HEMOGLOBIN A1C
Hgb A1c MFr Bld: 5.8 % of total Hgb — ABNORMAL HIGH (ref <5.7)
Mean Plasma Glucose: 120 (calc)
eAG (mmol/L): 6.6 (calc)

## 2019-12-03 LAB — LIPID PANEL
Cholesterol: 191 mg/dL (ref <200)
HDL: 37 mg/dL — ABNORMAL LOW (ref >=40)
LDL Cholesterol (Calc): 127 mg/dL (calc) — ABNORMAL HIGH
Non-HDL Cholesterol (Calc): 154 mg/dL (calc) — ABNORMAL HIGH (ref <130)
Total CHOL/HDL Ratio: 5.2 (calc) — ABNORMAL HIGH (ref <5.0)
Triglycerides: 159 mg/dL — ABNORMAL HIGH (ref <150)

## 2019-12-03 LAB — INSULIN, RANDOM: Insulin: 35.1 u[IU]/mL — ABNORMAL HIGH

## 2019-12-03 LAB — VITAMIN D 25 HYDROXY (VIT D DEFICIENCY, FRACTURES): Vit D, 25-Hydroxy: 74 ng/mL (ref 30–100)

## 2019-12-03 LAB — TSH: TSH: 1.35 mIU/L (ref 0.40–4.50)

## 2019-12-03 NOTE — Progress Notes (Signed)
===========================================================  -   Total Chol = 191 - OK  (Ideal or Goal is less tan 180)  And   - Bad  /Dangerous LDL Chol = 127 - too high  (Ideal or Goal is less than 70  !  )  - So.................  - Cholesterol is too high   - Cholesterol only comes from animal sources  - ie. meat, dairy, egg yolks  - Eat all the vegetables you want.  - Avoid meat, especially red meat - Beef AND Pork .  - Avoid cheese & dairy - milk & ice cream.     - Cheese is the most concentrated form of trans-fats which  is the worst thing to clog up our arteries.   - Veggie cheese is OK which can be found in the fresh  produce section at [employer] or [employer] or [employer] ========================================================  - A1c = 5.8% - slightly elevated   Blood sugar and A1c are elevated in the borderline and  early or pre-diabetes range which has the same   300% increased risk for heart attack, stroke, cancer and   alzheimer- type vascular dementia as full blown diabetes.   But the good news is that diet, exercise with  weight loss can cure the early diabetes at this point.   It is very important that you work harder with diet by  avoiding all foods that are white except chicken,   fish & calliflower.  - Avoid white rice  (brown & wild rice is OK),   - Avoid white potatoes  (sweet potatoes in moderation is OK),   White bread or wheat bread or anything made out of   white flour like bagels, donuts, rolls, buns, biscuits, cakes,  - pastries, cookies, pizza crust, and pasta (made from  white flour & egg whites)   - vegetarian pasta or spinach or wheat pasta is OK.  - Multigrain breads like Arnold's, Pepperidge Farm or   multigrain sandwich thins or high fiber breads like   Eureka bread or "Dave's Killer" breads that are  4 to 5 grams fiber per slice !  are best.    Diet, exercise and weight loss can reverse and cure  diabetes  in the early stages.   ===========================================================  - Vitamin D = 74 - excellent  ===========================================================  - All Else - CBC - Kidneys - Electrolytes - Liver - Magnesium & Thyroid    - all  Normal / OK ====================================================

## 2020-01-23 ENCOUNTER — Encounter: Payer: Self-pay | Admitting: Internal Medicine

## 2020-01-23 ENCOUNTER — Other Ambulatory Visit: Payer: Self-pay | Admitting: Internal Medicine

## 2020-01-23 DIAGNOSIS — M7660 Achilles tendinitis, unspecified leg: Secondary | ICD-10-CM

## 2020-01-29 ENCOUNTER — Ambulatory Visit: Payer: Self-pay

## 2020-01-29 ENCOUNTER — Encounter: Payer: Self-pay | Admitting: Orthopaedic Surgery

## 2020-01-29 ENCOUNTER — Ambulatory Visit: Payer: Managed Care, Other (non HMO) | Admitting: Orthopaedic Surgery

## 2020-01-29 VITALS — BP 143/87 | HR 56 | Ht 71.0 in | Wt 263.0 lb

## 2020-01-29 DIAGNOSIS — M7660 Achilles tendinitis, unspecified leg: Secondary | ICD-10-CM | POA: Diagnosis not present

## 2020-01-29 DIAGNOSIS — M25572 Pain in left ankle and joints of left foot: Secondary | ICD-10-CM

## 2020-01-29 DIAGNOSIS — M7732 Calcaneal spur, left foot: Secondary | ICD-10-CM | POA: Diagnosis not present

## 2020-01-29 NOTE — Progress Notes (Signed)
Office Visit Note   Patient: Nicholas Crosby           Date of Birth: [birth year]           MRN: [id number] Visit Date: 01/29/2020              Requested by: Unk Pinto, [address] [place],  [place] [address] PCP: Unk Pinto, MD   Assessment & Plan: Visit Diagnoses:  1. Pain in left ankle and joints of left foot     Plan: Visco heel inserts. Pathophysiology of calcific tendinopathy discussed. He has been losing weight has been active and doing increased activities which is likely aggravated the tendon. We discussed cardio activity is less likely to bother Achilles tendon such as exercise bike as long as he is not standing up. He can return if he has persistent symptoms. Dr. Vicente Serene sent in some nitro patches that he can use. Visco heel inserts discussed. If he has persistent problems will consider MRI imaging.  Follow-Up Instructions: No follow-ups on file.   Orders:  Orders Placed This Encounter  Procedures  . XR Ankle Complete Left   No orders of the defined types were placed in this encounter.     Procedures: No procedures performed   Clinical Data: No additional findings.   Subjective: Chief Complaint  Patient presents with  . Left Ankle - Pain    HPI 54 year old male with left distal Achilles pain. Past history of right Achilles tendinitis treated with anti-inflammatories and nitroglycerin patches with gradual improvement. He does not recall any specific injury to his left Achilles but states has been burning for many weeks. Stiffness when he first gets up with sharp pain he limps but it gradually worked its way out. BMI 36. He has been trying to maintain a higher level activity but is does not recall any specific injury to the left Achilles. He points directly at the Achilles tendon insertion where he has pain.  Review of Systems positive for back pain hypertension hyperlipidemia history of depression. Morbid obesity now BMI  improved to 36. History of abdominal aneurysm all other systems negative.   Objective: Vital Signs: BP (!) 143/87   Pulse (!) 56   Ht 5\' 11"  (1.803 m)   Wt 263 lb (119.3 kg)   BMI 36.68 kg/m   Physical Exam Constitutional:      Appearance: He is well-developed.  HENT:     Head: Normocephalic and atraumatic.  Eyes:     Pupils: Pupils are equal, round, and reactive to light.  Neck:     Thyroid: No thyromegaly.     Trachea: No tracheal deviation.  Cardiovascular:     Rate and Rhythm: Normal rate.  Pulmonary:     Effort: Pulmonary effort is normal.     Breath sounds: No wheezing.  Abdominal:     General: Bowel sounds are normal.     Palpations: Abdomen is soft.  Skin:    General: Skin is warm and dry.     Capillary Refill: Capillary refill takes less than 2 seconds.  Neurological:     Mental Status: He is alert and oriented to person, place, and time.  Psychiatric:        Behavior: Behavior normal.        Thought Content: Thought content normal.        Judgment: Judgment normal.     Ortho Exam patient has tenderness of the Achilles tendon insertion site on the calcaneus. Subtalar  motion is normal plantar fascial is normal. Peroneals posterior tib are active and take good resistance.  Specialty Comments:  No specialty comments available.  Imaging: No results found.   PMFS History: Patient Active Problem List   Diagnosis Date Noted  . Low back pain 04/05/2019  . Prostatism 12/05/2017  . Aortic atherosclerosis ([place]) 01/19/2016  . Paroxysmal A-fib ([place]) 10/12/2015  . Medication management 12/03/2013  . Morbid obesity ([place])  08/05/2013  . Abnormal glucose 04/29/2013  . Hypertension   . Hyperlipidemia   . Testosterone deficiency   . Vitamin D deficiency   . 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 Depression   . Hyperlipidemia   . Hypertension   . Prostate enlargement     Family History  Problem Relation Age of  Onset  . Hypertension Mother   . Hyperlipidemia Mother   . Diabetes Mother   . Cancer Mother        breast  . Heart disease Father   . Hyperlipidemia Father   . Hypertension Father     Past Surgical History:  Procedure Laterality Date  . COSMETIC SURGERY     liposuction  . CYST REMOVAL NECK    . NO PAST SURGERIES    . VASECTOMY  04/06/2012   Procedure: VASECTOMY;  Surgeon: Alexis Frock, MD;  Location: [facility];  Service: Urology;  Laterality: Bilateral;   Social History   Occupational History  . Not on file  Tobacco Use  . Smoking status: Never Smoker  . Smokeless tobacco: Never Used  Vaping Use  . Vaping Use: Never used  Substance and Sexual Activity  . Alcohol use: Yes    Alcohol/week: 2.0 standard drinks    Types: 2 Cans of beer per week    Comment: social  . Drug use: No  . Sexual activity: Not on file

## 2020-01-31 ENCOUNTER — Other Ambulatory Visit: Payer: Self-pay | Admitting: Internal Medicine

## 2020-01-31 DIAGNOSIS — G4733 Obstructive sleep apnea (adult) (pediatric): Secondary | ICD-10-CM

## 2020-01-31 MED ORDER — NITROGLYCERIN 0.2 MG/HR TD PT24
0.2000 mg | MEDICATED_PATCH | Freq: Every day | TRANSDERMAL | 0 refills | Status: DC
Start: 2020-01-31 — End: 2020-02-26

## 2020-02-23 ENCOUNTER — Other Ambulatory Visit: Payer: Self-pay | Admitting: Internal Medicine

## 2020-02-23 MED ORDER — HYDROCORTISONE (PERIANAL) 2.5 % EX CREA: TOPICAL_CREAM | CUTANEOUS | 3 refills | Status: AC

## 2020-02-26 ENCOUNTER — Other Ambulatory Visit: Payer: Self-pay | Admitting: Internal Medicine

## 2020-03-03 ENCOUNTER — Encounter: Payer: Self-pay | Admitting: Adult Health Nurse Practitioner

## 2020-03-03 ENCOUNTER — Other Ambulatory Visit: Payer: Self-pay

## 2020-03-03 ENCOUNTER — Ambulatory Visit: Payer: Managed Care, Other (non HMO) | Admitting: Adult Health Nurse Practitioner

## 2020-03-03 VITALS — BP 152/98 | HR 63 | Temp 97.7°F | Ht 71.0 in | Wt 273.0 lb

## 2020-03-03 DIAGNOSIS — E782 Mixed hyperlipidemia: Secondary | ICD-10-CM

## 2020-03-03 DIAGNOSIS — I10 Essential (primary) hypertension: Secondary | ICD-10-CM

## 2020-03-03 DIAGNOSIS — I480 Paroxysmal atrial fibrillation: Secondary | ICD-10-CM

## 2020-03-03 NOTE — Patient Instructions (Addendum)
   Blood pressure:  Change the way you take your blood pressure medication.   Continue:  Olmesartan 40mg  and minoxidil 10mg .  1. Atenolol 100mg  Try taking half, 50mg .  Monitor blood pressure, pulse.  about one week before next step.  2. After this add the whole Amlodipine (Norvasc) whole tablet 10mg  at night.   3. Verampamil 240mg  in the morning.    Check you blood pressure and pulse twice a day and record.  Send [place] a screen shot after one week then again at week 2.  Please let us know if you have nay new or worsening symptoms.

## 2020-03-03 NOTE — Progress Notes (Signed)
FOLLOW UP 3 MONTH   Assessment and Plan:    Essential Hypertension Uncontrolled today Change medication regimen Olmesartan 40mg  and minoxidil 10mg  in Verapamil 240mg  in am. Take atenolol 100mg , half tablet (50mg ) at night. THEN add amlodapine 10mg  at night. Check B/P twice a day and record.  Send info after one week and follow up in 2 or if continued elevated B/P or symptomatic. Labile but fair control on home logs; Monitor blood pressure at home; patient to call if consistently greater than 130/80 Continue DASH diet.   Reminder to go to the ER if any CP, SOB, nausea, dizziness, severe HA, changes vision/speech, left arm numbness and tingling and jaw pain.  Hyperlipademia Currently remains  above goal of 100 Been off Atorvastatin 20 mg for 6 months, needs to restart. Continue low cholesterol diet and exercise.  Check lipid panel.   Atherosclerosis of aorta Per CT 04/2016 Control blood pressure, cholesterol, glucose, increase exercise.   Paroxysmal Atrial Fibrillation CHADSVASC2 of 1 - has declined anticoagulation other than ASA and referral to cardiology Rate controlled - reportedly only seems to have with rare excessive ETOH consumption.   Abnormal glucose Continue diet and exercise.  Perform daily foot/skin check, notify office of any concerning changes.  Check A1C q70m, defer today   Morbid Obesity - BMI 37 with co morbidities Long discussion about weight loss, diet, and exercise Recommended diet heavy in fruits and veggies and low in animal meats, cheeses, and dairy products, appropriate calorie intake Discussed ideal weight for height  Patient will work on increasing exercise, continue with low carb diet - discussed need for high fiber, add supplement, then will transition to plant based minimally processed diet Will follow up in 3 months  Vitamin D Def At goal at last visit; continue supplementation to maintain goal of 60-100 Defer Vit D level  Hypogonadism -  continue to monitor, states medication is helping with symptoms of low T.    Continue diet and meds as discussed. Further disposition pending results of labs. Discussed med's effects and SE's.   Over 30 minutes of exam, counseling, chart review, and critical decision making was performed.   Future Appointments  Date Time Provider [place]  06/01/2020  3:00 PM Unk Pinto, MD [facility] None    ----------------------------------------------------------------------------------------------------------------------  HPI 54 y.o. male  presents for 3 month follow up on HTN, HLDl, prediabetes, obesity and vitamin D deficiency.   He reports that he is having low oxygen from home pulseox.  He has sleep study scheduled for December 2021.  May help liable HTN issues?  He is getting up to use the bathroom 5-6 times a night.  He started taking doxazosin.  He reports some hesitancy.   He also has dx of paroxysmal a. Fib with CHADSVASC2 of 1 - risks have been discussed, he is on ASA, has declined anticoagulation after discussion of risks.  Rate controlled - reportedly only seems to have with rare excessive ETOH consumption.   BMI is Body mass index is 38.08 kg/m., he has been working on diet and exercise. 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 He is watching diet for carbs and sugars. He has been doing some intermittent fasting (18 hours) and keto. Wt Readings from Last 3 Encounters:  03/03/20 273 lb (123.8 kg)  01/29/20 263 lb (119.3 kg)  12/02/19 270 lb 12.8 oz (122.8 kg)   He has aortic atherosclerosis per CT abd 04/2016 His  blood pressure has been labile at home (120/70-rare up to 150/80s, typically 130s/low 80s), today their BP is BP: (!) 152/98 hasn't taken torsemide yet today   He does workout. He denies chest pain, shortness of breath, dizziness.    He is on cholesterol medication (atorvastatin 20 mg every other day ) and  denies myalgias. His cholesterol is not at goal. The cholesterol last visit was:   Lab Results  Component Value Date   CHOL 189 03/03/2020   HDL 40 03/03/2020   LDLCALC 135 (H) 03/03/2020   TRIG 51 03/03/2020   CHOLHDL 4.7 03/03/2020    He has been working on diet and exercise for prediabetes, and denies increased appetite, nausea, paresthesia of the feet, polydipsia, polyuria, visual disturbances, vomiting and weight loss. Last A1C in the office was:  Lab Results  Component Value Date   HGBA1C 5.6 03/03/2020   Patient is on Vitamin D supplement:    Lab Results  Component Value Date   VD25OH 74 12/02/2019     He has a history of testosterone deficiency and is on testosterone replacement, due for shot today, just got, doing every 2 weeks. He states that the testosterone helps with his energy, libido, muscle mass. Lab Results  Component Value Date   TESTOSTERONE 183 (L) 05/15/2019     Current Medications:  Current Outpatient Medications on File Prior to Visit  Medication Sig  . amLODipine (NORVASC) 10 MG tablet Take 1 tablet every morning for BP  . aspirin 81 MG tablet Take 81 mg by mouth daily.  Marland Kitchen atenolol (TENORMIN) 100 MG tablet Take 1 tablet Daily for blood pressure.  Marland Kitchen atorvastatin (LIPITOR) 40 MG tablet Take 1 tablet Daily for Cholestrol  . Cholecalciferol (VITAMIN D3) 5000 units CAPS TAKE 1 CAPSULE BY MOUTH DAILY (Patient taking differently: 2 (two) times a day. )  . cyclobenzaprine (FLEXERIL) 10 MG tablet Take 1/2 to 1 tablet 2 to 3 x /day as needed for muscle spasm  . diclofenac Sodium (VOLTAREN) 1 % GEL Apply 2 grams 3 to 4 x /day  To inflamed area  . doxazosin (CARDURA) 4 MG tablet Take 1 tablet at Bedtime for BP & Prostate  . finasteride (PROSCAR) 5 MG tablet Take 5 mg by mouth daily.  . fluorouracil (EFUDEX) 5 % cream Apply to suspect areas 2 x / day til skin raw (Patient taking differently: as needed. Apply to suspect areas 2 x / day til skin raw)  . hydrocortisone  (PROCTOSOL HC) 2.5 % rectal cream Apply rectally 4 x /day as needed  . minoxidil (LONITEN) 10 MG tablet Take 1 to 2 tablets  Daily for BP  . montelukast (SINGULAIR) 10 MG tablet Take 1 tablet daily for Allergies & Asthma  . nitroGLYCERIN (NITRODUR - DOSED IN MG/24 HR) 0.2 mg/hr patch PLACE 1 PATCH (0.2 MG TOTAL) ONTO THE SKIN DAILY.  Marland Kitchen olmesartan (BENICAR) 40 MG tablet Take 1 tablet Daily for BP  . testosterone cypionate (DEPOTESTOSTERONE CYPIONATE) 200 MG/ML injection Inject 2cc IM q 2 weeks and needs 3 cc syringes &  1" 21 G needles  . torsemide (DEMADEX) 20 MG tablet Take 1 tablet every morning for BP & Fluid Retention  . verapamil (CALAN-SR) 240 MG CR tablet TAKE 1 TABLET DAILY WITH FOOD FOR BP   No current facility-administered medications on file prior to visit.     Allergies:  Allergies  Allergen Reactions  . Zocor [Simvastatin] Other (See Comments)    cpk elev  . Crestor [  Rosuvastatin] Palpitations     Medical History:  Past Medical History:  Diagnosis Date  . Depression   . Hyperlipidemia   . Hypertension   . Prostate enlargement    Family history- Reviewed and unchanged Social history- Reviewed and unchanged   Review of Systems:  Review of Systems  Constitutional: Negative for malaise/fatigue and weight loss.  HENT: Negative for hearing loss and tinnitus.   Eyes: Negative for blurred vision and double vision.  Respiratory: Negative for cough, shortness of breath and wheezing.   Cardiovascular: Negative for chest pain, palpitations, orthopnea, claudication and leg swelling.  Gastrointestinal: Negative for abdominal pain, blood in stool, constipation, diarrhea, heartburn, melena, nausea and vomiting.  Genitourinary: Positive for frequency. Negative for dysuria, flank pain, hematuria and urgency.  Musculoskeletal: Negative for joint pain and myalgias.  Skin: Negative for rash.  Neurological: Negative for dizziness, tingling, sensory change, weakness and headaches.   Endo/Heme/Allergies: Negative for polydipsia.  Psychiatric/Behavioral: Negative.   All other systems reviewed and are negative.   Physical Exam: BP (!) 152/98   Pulse 63   Temp 97.7 F (36.5 C)   Ht 5\' 11"  (1.803 m)   Wt 273 lb (123.8 kg)   SpO2 96%   BMI 38.08 kg/m  Wt Readings from Last 3 Encounters:  03/03/20 273 lb (123.8 kg)  01/29/20 263 lb (119.3 kg)  12/02/19 270 lb 12.8 oz (122.8 kg)   General Appearance: Well nourished, in no apparent distress. Eyes: PERRLA, EOMs, conjunctiva no swelling or erythema Sinuses: No Frontal/maxillary tenderness ENT/Mouth: Ext aud canals clear, TMs without erythema, bulging. No erythema, swelling, or exudate on post pharynx.  Tonsils not swollen or erythematous. Hearing normal.  Neck: Supple, thyroid normal.  Respiratory: Respiratory effort normal, BS equal bilaterally without rales, rhonchi, wheezing or stridor.  Cardio: RRR with no MRGs. Brisk peripheral pulses without edema.  Abdomen: Soft, + BS.  Non tender, no guarding, rebound, hernias, masses. Lymphatics: Non tender without lymphadenopathy.  Musculoskeletal: Full ROM, 5/5 strength, Normal gait Skin: Warm, dry without rashes, lesions, ecchymosis.  Neuro: Cranial nerves intact. No cerebellar symptoms.  Psych: Awake and oriented X 3, normal affect, Insight and Judgment appropriate.    Garnet Sierras, NP 10:31 AM [facility]

## 2020-03-04 LAB — CBC WITH DIFFERENTIAL/PLATELET
Absolute Monocytes: 563 cells/uL (ref 200–950)
Basophils Absolute: 41 cells/uL (ref 0–200)
Basophils Relative: 0.7 %
Eosinophils Absolute: 93 cells/uL (ref 15–500)
Eosinophils Relative: 1.6 %
HCT: 40.9 % (ref 38.5–50.0)
Hemoglobin: 13.8 g/dL (ref 13.2–17.1)
Lymphs Abs: 1404 cells/uL (ref 850–3900)
MCH: 27.9 pg (ref 27.0–33.0)
MCHC: 33.7 g/dL (ref 32.0–36.0)
MCV: 82.6 fL (ref 80.0–100.0)
MPV: 11.5 fL (ref 7.5–12.5)
Monocytes Relative: 9.7 %
Neutro Abs: 3700 cells/uL (ref 1500–7800)
Neutrophils Relative %: 63.8 %
Platelets: 233 10*3/uL (ref 140–400)
RBC: 4.95 10*6/uL (ref 4.20–5.80)
RDW: 13.8 % (ref 11.0–15.0)
Total Lymphocyte: 24.2 %
WBC: 5.8 10*3/uL (ref 3.8–10.8)

## 2020-03-04 LAB — COMPLETE METABOLIC PANEL WITH GFR
AG Ratio: 1.7 (calc) (ref 1.0–2.5)
ALT: 13 U/L (ref 9–46)
AST: 13 U/L (ref 10–35)
Albumin: 4.3 g/dL (ref 3.6–5.1)
Alkaline phosphatase (APISO): 44 U/L (ref 35–144)
BUN: 17 mg/dL (ref 7–25)
CO2: 30 mmol/L (ref 20–32)
Calcium: 9.3 mg/dL (ref 8.6–10.3)
Chloride: 104 mmol/L (ref 98–110)
Creat: 1.02 mg/dL (ref 0.70–1.33)
GFR, Est African American: 97 mL/min/{1.73_m2} (ref >=60)
GFR, Est Non African American: 84 mL/min/{1.73_m2} (ref >=60)
Globulin: 2.6 g/dL (calc) (ref 1.9–3.7)
Glucose, Bld: 98 mg/dL (ref 65–99)
Potassium: 4.6 mmol/L (ref 3.5–5.3)
Sodium: 141 mmol/L (ref 135–146)
Total Bilirubin: 0.5 mg/dL (ref 0.2–1.2)
Total Protein: 6.9 g/dL (ref 6.1–8.1)

## 2020-03-04 LAB — LIPID PANEL
Cholesterol: 189 mg/dL (ref <200)
HDL: 40 mg/dL (ref >=40)
LDL Cholesterol (Calc): 135 mg/dL (calc) — ABNORMAL HIGH
Non-HDL Cholesterol (Calc): 149 mg/dL (calc) — ABNORMAL HIGH (ref <130)
Total CHOL/HDL Ratio: 4.7 (calc) (ref <5.0)
Triglycerides: 51 mg/dL (ref <150)

## 2020-03-04 LAB — HEMOGLOBIN A1C
Hgb A1c MFr Bld: 5.6 % of total Hgb (ref <5.7)
Mean Plasma Glucose: 114 (calc)
eAG (mmol/L): 6.3 (calc)

## 2020-03-04 LAB — TSH: TSH: 1.58 mIU/L (ref 0.40–4.50)

## 2020-03-10 ENCOUNTER — Other Ambulatory Visit: Payer: Self-pay | Admitting: Internal Medicine

## 2020-03-10 MED ORDER — DILTIAZEM HCL ER COATED BEADS 360 MG PO CP24: ORAL_CAPSULE | ORAL | 0 refills | Status: DC

## 2020-03-10 MED ORDER — ISOSORBIDE MONONITRATE ER 120 MG PO TB24: ORAL_TABLET | ORAL | 0 refills | Status: DC

## 2020-03-30 ENCOUNTER — Encounter: Payer: Self-pay | Admitting: Internal Medicine

## 2020-03-30 ENCOUNTER — Ambulatory Visit: Payer: Managed Care, Other (non HMO) | Admitting: Internal Medicine

## 2020-03-30 ENCOUNTER — Other Ambulatory Visit: Payer: Self-pay

## 2020-03-30 VITALS — BP 142/90 | HR 70 | Temp 97.2°F | Resp 16 | Ht 72.0 in | Wt 267.8 lb

## 2020-03-30 DIAGNOSIS — R0989 Other specified symptoms and signs involving the circulatory and respiratory systems: Secondary | ICD-10-CM

## 2020-03-30 DIAGNOSIS — G4733 Obstructive sleep apnea (adult) (pediatric): Secondary | ICD-10-CM | POA: Diagnosis not present

## 2020-03-30 NOTE — Patient Instructions (Signed)

## 2020-03-30 NOTE — Progress Notes (Signed)
   History of Present Illness:      Patient is a very nice 54 yo [name] with hx/o severe labile HTN requiring multi drugs for control. Since last visit, he relates that he continued his Verapamil and never started the Diltiazem or Imdur that were intended new starts for him.  His list of recorded BP's are in Normal range. He denies any HA's dizziness, CP, palpitations, dyspnea or edema. Patient also relates that he has upcoming Sleep Studies at the [place].   Medications  Current Outpatient Medications (Endocrine & Metabolic):  .  testosterone cypionate (DEPOTESTOSTERONE CYPIONATE) 200 MG/ML injection, Inject 2cc IM q 2 weeks and needs 3 cc syringes &  1" 21 G needles ============================================================ .  amLODipine (NORVASC) 10 MG tablet, Take 1 tablet every morning for BP .  atenolol (TENORMIN) 100 MG tablet, Take 1 tablet Daily for blood pressure. Marland Kitchen  atorvastatin (LIPITOR) 40 MG tablet, Take 1 tablet Daily for Cholestrol - Still taking Verapamil 240 mg daily . never started  .  doxazosin (CARDURA) 4 MG tablet, Take 1 tablet at Bedtime . didn't start .  minoxidil (LONITEN) 10 MG tablet, Take 1 to 2 tablets  Daily for BP .  olmesartan (BENICAR) 40 MG tablet, Take 1 tablet Daily for BP .  torsemide (DEMADEX) 20 MG tablet, Take 1 tablet every morning  ============================================================== .  montelukast (SINGULAIR) 10 MG tablet, Take 1 tablet daily for Allergies & Asthma .  aspirin 81 MG tablet, Take 81 mg by mouth daily. .  Cholecalciferol (VITAMIN D3) 5000 units -taking  (two) times a day. ) .  cyclobenzaprine  10 MG tablet, Take 1/2 to 1 tablet 2 to 3 x /day as needed  .  diclofenac  1 % GEL, Apply 2 grams 3 to 4 x /day  To inflamed area .  finasteride  5 MG tablet, Take 5 mg by mouth daily. .  fluorouracil 5 % cream,Patient taking differently: as needed.  .  hydrocortisone (PROCTOSOL HC) 2.5 % rectal cream, Apply rectally 4 x /day as  needed  Problem list He has Hypertension; Hyperlipidemia; Testosterone deficiency; Vitamin D deficiency; Depression, major, in remission ([place]); Abnormal glucose; Morbid obesity ([place]) ; Medication management; Paroxysmal A-fib ([place]); Aortic atherosclerosis ([place]); Prostatism; Low back pain; Heel spur, left; and Insertional Achilles tendinopathy on their problem list.   Observations/Objective:   BP (!) 142/90   Pulse 70   Temp (!) 97.2 F (36.2 C)   Resp 16   Ht 6' (1.829 m)   Wt 267 lb 12.8 oz (121.5 kg)   SpO2 97%   BMI 36.32 kg/m   HEENT - WNL. Neck - supple.  Chest - Clear equal BS. Cor - Nl HS. RRR w/o sig MGR. PP 1(+). No edema. MS- FROM w/o deformities.  Gait Nl. Neuro -  Nl w/o focal abnormalities.  Assessment and Plan:   1. Labile hypertension  - Continue meds same as well as continuing BP monitoring   2. OSA (obstructive sleep apnea)   Follow Up Instructions:      I discussed the assessment and treatment plan with the patient. The patient was provided an opportunity to ask questions and all were answered. The patient agreed with the plan and demonstrated an understanding of the instructions.       The patient was advised to call back or seek an in-person evaluation if the symptoms worsen or if the condition fails to improve as anticipated.    Kirtland Bouchard, MD

## 2020-03-31 ENCOUNTER — Other Ambulatory Visit: Payer: Self-pay | Admitting: Internal Medicine

## 2020-03-31 DIAGNOSIS — I10 Essential (primary) hypertension: Secondary | ICD-10-CM

## 2020-03-31 MED ORDER — NITROGLYCERIN 0.2 MG/HR TD PT24: MEDICATED_PATCH | TRANSDERMAL | 0 refills | Status: DC

## 2020-03-31 MED ORDER — OLMESARTAN MEDOXOMIL 40 MG PO TABS: ORAL_TABLET | ORAL | 0 refills | Status: DC

## 2020-04-03 ENCOUNTER — Ambulatory Visit: Payer: Managed Care, Other (non HMO) | Admitting: Internal Medicine

## 2020-04-06 ENCOUNTER — Other Ambulatory Visit: Payer: Self-pay

## 2020-04-06 DIAGNOSIS — I10 Essential (primary) hypertension: Secondary | ICD-10-CM

## 2020-04-13 ENCOUNTER — Other Ambulatory Visit (HOSPITAL_BASED_OUTPATIENT_CLINIC_OR_DEPARTMENT_OTHER): Payer: Self-pay | Admitting: Internal Medicine

## 2020-04-13 ENCOUNTER — Ambulatory Visit: Payer: Managed Care, Other (non HMO) | Attending: Internal Medicine

## 2020-04-13 DIAGNOSIS — Z23 Encounter for immunization: Secondary | ICD-10-CM

## 2020-04-13 NOTE — Progress Notes (Signed)
   Covid-19 Vaccination Clinic  Name:  Nicholas Crosby    MRN: [id number] DOB: 01-10-1966  04/13/2020  Nicholas Crosby was observed post Covid-19 immunization for 15 minutes without incident. He was provided with Vaccine Information Sheet and instruction to access the V-Safe system.   Nicholas Crosby was instructed to call 911 with any severe reactions post vaccine: Marland Kitchen Difficulty breathing  . Swelling of face and throat  . A fast heartbeat  . A bad rash all over body  . Dizziness and weakness   Immunizations Administered    Name Date Dose VIS Date Route   Pfizer COVID-19 Vaccine 04/13/2020  2:02 PM 0.3 mL 02/05/2020 Intramuscular   Manufacturer: [employer], [employer]   Lot: [id number]   NDC: [address]-7

## 2020-04-14 MED FILL — PFIZER-BIONTECH COVID-19 VA: 30 | 21 days supply | Qty: 0 | Fill #0

## 2020-04-16 ENCOUNTER — Other Ambulatory Visit: Payer: Self-pay | Admitting: Internal Medicine

## 2020-04-16 DIAGNOSIS — I10 Essential (primary) hypertension: Secondary | ICD-10-CM

## 2020-04-16 MED ORDER — NITROGLYCERIN 0.2 MG/HR TD PT24: MEDICATED_PATCH | TRANSDERMAL | 2 refills | Status: DC

## 2020-05-23 ENCOUNTER — Other Ambulatory Visit: Payer: Self-pay | Admitting: Internal Medicine

## 2020-05-23 DIAGNOSIS — I10 Essential (primary) hypertension: Secondary | ICD-10-CM

## 2020-05-23 MED ORDER — VERAPAMIL HCL ER 240 MG PO TBCR: EXTENDED_RELEASE_TABLET | ORAL | 3 refills | Status: AC

## 2020-05-31 ENCOUNTER — Encounter: Payer: Self-pay | Admitting: Internal Medicine

## 2020-05-31 NOTE — Progress Notes (Signed)
Annual  Screening/Preventative Visit  & Comprehensive Evaluation & Examination     This very nice 55 y.o. [name] presents for a Screening /Preventative Visit & comprehensive evaluation and management of multiple medical co-morbidities.  Patient has been followed for HTN, HLD, Prediabetes and Vitamin D Deficiency. Home Sleep study thru the [place] showed an AHI of 6. Formal Sleep study is ordered by the [place] to be done at a [facility] center here in [place].        HTN predates 82 years since 63 (age 73). Patient's BP has been controlled at home.  Today's BP is elevated at 144/88.  Patient 's BP has been very labile and has required 5-6 meds to control.  In 2017, he had a negative Renal Aa U/S.    In 2021 , he's has neg diagnostic w/u with Renins, Aldosterone, 24 hr Catechol, VMA & Metanephrine. Patient had 1 remote episode of transient pAfb (CHADsVASc=1) and is on LD bASA. Patient denies any cardiac symptoms as chest pain, palpitations, shortness of breath, dizziness or ankle swelling.      Patient has been dx'd with Lt Achilles tendonitis by Ortho and has been using a Nitro patch on his left heel. He requests a Prednisone Rx to see if will help with his pain.       Patient's hyperlipidemia is controlled with diet and Atorvastatin. Patient denies myalgias or other medication SE's. Last lipids were not at goal:  Lab Results  Component Value Date   CHOL 189 03/03/2020   HDL 40 03/03/2020   LDLCALC 135 (H) 03/03/2020   TRIG 51 03/03/2020   CHOLHDL 4.7 03/03/2020        Patient has hx/o moderate obesity (BMI 36+) and  prediabetes (A1c 6.4% / 2012 &5.8% /2014)and patient denies reactive hypoglycemic symptoms, visual blurring, diabetic polys or paresthesias. Last A1c was normal & at goal:   Lab Results  Component Value Date   HGBA1C 5.6 03/03/2020    Patient also has hx/o Testosterone Deficiency ("191" /2012 & "150" /2014) and is on parenteral  replacement  with improved stamina & sense of well being.      Lastly, patient has history of Vitamin D Deficiency ("21"/2008) and last vitamin D was at goal:   Lab Results  Component Value Date   VD25OH 74 12/02/2019    Current Outpatient Medications on File Prior to Visit  Medication Sig  . amLODipine  10 MG tablet Take 1 tablet every morning  . aspirin 81 MG tablet Take  daily.  Marland Kitchen atenolol  100 MG tablet Take 1 tablet Daily   . atorvastatin  40 MG tablet Take 1 tablet Daily   . VITAMIN D 5000 units CAPS Takes 2  times a day.)  . cyclobenzaprine 10 MG tablet Take 1/2 to 1 tablet 2 to 3 x /day as needed   . diclofenac  1 % GEL Apply 2 grams 3 to 4 x /day    . doxazosin 4 MG tablet Take 1 tablet at Bedtime for BP & Prostate  . finasteride  5 MG tablet Take 5 mg by mouth daily.  . fluorouracil  5 % cream Apply to suspect areas 2 x / day til skin raw  . PROCTOSOL HC 2.5% rectal crm Apply rectally 4 x /day as needed  . minoxidil  10 MG tablet Take 1 to 2 tablets  Daily for BP  . Montelukast  10 MG tablet Take 1 tablet daily for Allergies  . NITRODUR -  0.2 mg/hr patch Apply 1 patch every 24 hours  . olmesartan  40 MG tablet Take 1 tabletDaily          . testosterone cypio 200 MG/ML injec Inject 2cc IM q 2 wks  . torsemide 20 MG tablet Take 1 tablet every morning   . verapamil -SR 240 MG CR  Take  1 tablet  Daily  with Food for BP     Allergies  Allergen Reactions  . Zocor [Simvastatin] Other (See Comments)    cpk elev  . Crestor [Rosuvastatin] Palpitations    Past Medical History:  Diagnosis Date  . Depression   . Hyperlipidemia   . Hypertension   . Prostate enlargement    Health Maintenance  Topic Date Due  . PNEUMOCOCCAL POLYSACCHARIDE VACCINE AGE 89-64 HIGH RISK  Never done  . FOOT EXAM  Never done  . OPHTHALMOLOGY EXAM  Never done  . Fecal DNA (Cologuard)  03/25/2020  . HEMOGLOBIN A1C  08/31/2020  . COVID-19 Vaccine (4 - Booster for Pfizer series) 10/12/2020  .  TETANUS/TDAP  05/17/2025  . INFLUENZA VACCINE  Completed  . Hepatitis C Screening  Completed  . HIV Screening  Completed   Immunization History  Administered Date(s) Administered  . Influenza- 08/29/2017, 01/16/2018  . PFIZESARS-COV-2 Vaccination 04/13/2020  . 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 COSMETIC SURGERY     liposuction  . CYST REMOVAL NECK    . NO PAST SURGERIES    . VASECTOMY  04/06/2012   Procedure: VASECTOMY;  Surgeon: Alexis Frock, MD;  Location: [facility];  Service: Urology;  Laterality: Bilateral;   Family History  Problem Relation Age of Onset  . Hypertension Mother   . Hyperlipidemia Mother   . Diabetes Mother   . Cancer Mother        breast  . Heart disease Father   . Hyperlipidemia Father   . Hypertension Father    Social History   Socioeconomic History  . Marital status: Divorced  . Number of children: 1 son & 1 daughter  Occupational History  . Fireman  Tobacco Use  . Smoking status: Never Smoker  . Smokeless tobacco: Never Used  Vaping Use  . Vaping Use: Never used  Substance and Sexual Activity  . Alcohol use: Yes    Alcohol/week: 2.0 standard drinks    Types: 2 Cans of beer per week    Comment: social  . Drug use: No  . Sexual activity: Not on file    ROS Constitutional: Denies fever, chills, weight loss/gain, headaches, insomnia,  night sweats or change in appetite. Does c/o fatigue. Eyes: Denies redness, blurred vision, diplopia, discharge, itchy or watery eyes.  ENT: Denies discharge, congestion, post nasal drip, epistaxis, sore throat, earache, hearing loss, dental pain, Tinnitus, Vertigo, Sinus pain or snoring.  Cardio: Denies chest pain, palpitations, irregular heartbeat, syncope, dyspnea,  diaphoresis, orthopnea, PND, claudication or edema Respiratory: denies cough, dyspnea, DOE, pleurisy, hoarseness, laryngitis or wheezing.  Gastrointestinal: Denies dysphagia, heartburn, reflux, water brash, pain, cramps, nausea, vomiting, bloating, diarrhea, constipation, hematemesis, melena, hematochezia, jaundice or hemorrhoids Genitourinary: Denies dysuria, frequency, urgency, nocturia, hesitancy, discharge, hematuria or flank pain Musculoskeletal: Denies arthralgia, myalgia, stiffness, Jt. Swelling, pain, limp  or strain/sprain. Denies Falls. Skin: Denies puritis, rash, hives, warts, acne, eczema or change in skin lesion Neuro: No weakness, tremor, incoordination, spasms, paresthesia or pain Psychiatric: Denies confusion, memory loss or sensory loss. Denies Depression. Endocrine: Denies change in weight, skin, hair change, nocturia, and paresthesia, diabetic polys, visual blurring or hyper / hypo glycemic episodes.  Heme/Lymph: No excessive bleeding, bruising or enlarged lymph nodes.  Physical Exam  BP (!) 144/88   Pulse (!) 58   Temp (!) 97 F (36.1 C)   Resp 16   Ht 5\' 11"  (1.803 m)   Wt 273 lb 3.2 oz (123.9 kg)   SpO2 98%   BMI 38.10 kg/m   General Appearance: over nourished and well groomed and in no apparent distress.  Eyes: PERRLA, EOMs, conjunctiva no swelling or erythema, normal fundi and vessels. Sinuses: No frontal/maxillary tenderness ENT/Mouth: EACs patent / TMs  nl. Nares clear without erythema, swelling, mucoid exudates. Oral hygiene is good. No erythema, swelling, or exudate. Tongue normal, non-obstructing. Tonsils not swollen or erythematous. Hearing normal.  Neck: Supple, thyroid not palpable. No bruits, nodes or JVD. Respiratory: Respiratory effort normal.  BS equal and clear bilateral without rales, rhonci, wheezing or stridor. Cardio: Heart sounds are normal with regular rate and rhythm and no murmurs, rubs or gallops. Peripheral pulses are normal and equal  bilaterally without edema. No aortic or femoral bruits. Chest: symmetric with normal excursions and percussion.  Abdomen: Soft, with Nl bowel sounds. Nontender, no guarding, rebound, hernias, masses, or organomegaly.  Lymphatics: Non tender without lymphadenopathy.  Musculoskeletal: Full ROM all peripheral extremities, joint stability, 5/5 strength and normal gait. Skin: Warm and dry without rashes, lesions, cyanosis, clubbing or  ecchymosis.  Neuro: Cranial nerves intact, reflexes equal bilaterally. Normal muscle tone, no cerebellar symptoms. Sensation intact.  Pysch: Alert and oriented X 3 with normal affect, insight and judgment appropriate.   Assessment and Plan  1. Annual Preventative/Screening Exam    2. Resistant hypertension  - EKG 12-Lead - [place], RETROPERITNL ABD,  LTD - Urinalysis, Routine w reflex microscopic - Microalbumin / creatinine urine ratio - CBC with Differential/Platelet - COMPLETE METABOLIC PANEL WITH GFR - Magnesium - TSH  3. Hyperlipidemia, mixed  - EKG 12-Lead - [place], RETROPERITNL ABD,  LTD - Magnesium - Lipid panel - TSH  4. Abnormal glucose  - EKG 12-Lead - [place], RETROPERITNL ABD,  LTD - Hemoglobin A1c - Insulin, random  5. Vitamin D deficiency  - VITAMIN D 25 Hydroxyl  6. Testosterone deficiency  - Testosterone  7. Aortic atherosclerosis (HCC)  - EKG 12-Lead - [place], RETROPERITNL ABD,  LTD  8. Benign prostatic hyperplasia with urinary frequency   9. Screening for colorectal cancer  - Cologuard  10. Prostate cancer screening  - PSA  11. Screening for ischemic heart disease  - EKG 12-Lead  12. FHx: heart disease  - EKG 12-Lead - [place], RETROPERITNL ABD,  LTD  13. Screening for AAA (aortic abdominal aneurysm)  - [place], RETROPERITNL ABD,  LTD  14. Screening-pulmonary TB  - TB Skin Test  15. Fatigue, unspecified type  - Vitamin B12 - CBC with Differential/Platelet - Magnesium - Iron,Total/Total Iron Binding Cap  16.  Medication management  - Urinalysis, Routine w reflex microscopic - Microalbumin / creatinine urine ratio - Cologuard - CBC with Differential/Platelet - COMPLETE METABOLIC PANEL WITH GFR - Magnesium - Lipid panel - TSH - Hemoglobin A1c - Insulin, random - VITAMIN D 25 Hydroxy  Patient was counseled in prudent diet, weight control to achieve/maintain BMI less than 25, BP monitoring, regular exercise and medications as discussed.  Discussed med effects and SE's. Routine screening labs and tests as requested with regular follow-up as recommended. Over 40 minutes of exam, counseling, chart review and high complex critical decision making was performed   Kirtland Bouchard, MD

## 2020-05-31 NOTE — Patient Instructions (Signed)

## 2020-06-01 ENCOUNTER — Ambulatory Visit: Payer: Managed Care, Other (non HMO) | Admitting: Internal Medicine

## 2020-06-01 ENCOUNTER — Other Ambulatory Visit: Payer: Self-pay

## 2020-06-01 VITALS — BP 144/88 | HR 58 | Temp 97.0°F | Resp 16 | Ht 71.0 in | Wt 273.2 lb

## 2020-06-01 DIAGNOSIS — Z131 Encounter for screening for diabetes mellitus: Secondary | ICD-10-CM | POA: Diagnosis not present

## 2020-06-01 DIAGNOSIS — Z1329 Encounter for screening for other suspected endocrine disorder: Secondary | ICD-10-CM | POA: Diagnosis not present

## 2020-06-01 DIAGNOSIS — Z130 Encounter for screening for diseases of the blood and blood-forming organs and certain disorders involving the immune mechanism: Secondary | ICD-10-CM | POA: Diagnosis not present

## 2020-06-01 DIAGNOSIS — E559 Vitamin D deficiency, unspecified: Secondary | ICD-10-CM

## 2020-06-01 DIAGNOSIS — Z0000 Encounter for general adult medical examination without abnormal findings: Secondary | ICD-10-CM

## 2020-06-01 DIAGNOSIS — Z13220 Encounter for screening for lipoid disorders: Secondary | ICD-10-CM | POA: Diagnosis not present

## 2020-06-01 DIAGNOSIS — Z1211 Encounter for screening for malignant neoplasm of colon: Secondary | ICD-10-CM

## 2020-06-01 DIAGNOSIS — R350 Frequency of micturition: Secondary | ICD-10-CM | POA: Diagnosis not present

## 2020-06-01 DIAGNOSIS — Z136 Encounter for screening for cardiovascular disorders: Secondary | ICD-10-CM | POA: Diagnosis not present

## 2020-06-01 DIAGNOSIS — Z0001 Encounter for general adult medical examination with abnormal findings: Secondary | ICD-10-CM

## 2020-06-01 DIAGNOSIS — Z79899 Other long term (current) drug therapy: Secondary | ICD-10-CM

## 2020-06-01 DIAGNOSIS — R5383 Other fatigue: Secondary | ICD-10-CM

## 2020-06-01 DIAGNOSIS — E782 Mixed hyperlipidemia: Secondary | ICD-10-CM

## 2020-06-01 DIAGNOSIS — I700 Atherosclerosis of aorta: Secondary | ICD-10-CM

## 2020-06-01 DIAGNOSIS — Z125 Encounter for screening for malignant neoplasm of prostate: Secondary | ICD-10-CM

## 2020-06-01 DIAGNOSIS — Z1389 Encounter for screening for other disorder: Secondary | ICD-10-CM | POA: Diagnosis not present

## 2020-06-01 DIAGNOSIS — R7309 Other abnormal glucose: Secondary | ICD-10-CM

## 2020-06-01 DIAGNOSIS — I10 Essential (primary) hypertension: Secondary | ICD-10-CM | POA: Diagnosis not present

## 2020-06-01 DIAGNOSIS — Z111 Encounter for screening for respiratory tuberculosis: Secondary | ICD-10-CM

## 2020-06-01 DIAGNOSIS — Z8249 Family history of ischemic heart disease and other diseases of the circulatory system: Secondary | ICD-10-CM

## 2020-06-01 DIAGNOSIS — E349 Endocrine disorder, unspecified: Secondary | ICD-10-CM

## 2020-06-01 DIAGNOSIS — N401 Enlarged prostate with lower urinary tract symptoms: Secondary | ICD-10-CM | POA: Diagnosis not present

## 2020-06-01 MED ORDER — DEXAMETHASONE 4 MG PO TABS: ORAL_TABLET | ORAL | 1 refills | Status: DC

## 2020-06-02 ENCOUNTER — Other Ambulatory Visit: Payer: Self-pay | Admitting: Internal Medicine

## 2020-06-02 DIAGNOSIS — E349 Endocrine disorder, unspecified: Secondary | ICD-10-CM

## 2020-06-02 LAB — CBC WITH DIFFERENTIAL/PLATELET
Absolute Monocytes: 861 cells/uL (ref 200–950)
Basophils Absolute: 28 cells/uL (ref 0–200)
Basophils Relative: 0.4 %
Eosinophils Absolute: 42 cells/uL (ref 15–500)
Eosinophils Relative: 0.6 %
HCT: 41.3 % (ref 38.5–50.0)
Hemoglobin: 13.8 g/dL (ref 13.2–17.1)
Lymphs Abs: 1456 cells/uL (ref 850–3900)
MCH: 27.3 pg (ref 27.0–33.0)
MCHC: 33.4 g/dL (ref 32.0–36.0)
MCV: 81.8 fL (ref 80.0–100.0)
MPV: 11.4 fL (ref 7.5–12.5)
Monocytes Relative: 12.3 %
Neutro Abs: 4613 cells/uL (ref 1500–7800)
Neutrophils Relative %: 65.9 %
Platelets: 245 10*3/uL (ref 140–400)
RBC: 5.05 10*6/uL (ref 4.20–5.80)
RDW: 14.6 % (ref 11.0–15.0)
Total Lymphocyte: 20.8 %
WBC: 7 10*3/uL (ref 3.8–10.8)

## 2020-06-02 LAB — URINALYSIS, ROUTINE W REFLEX MICROSCOPIC
Bilirubin Urine: NEGATIVE
Glucose, UA: NEGATIVE
Hgb urine dipstick: NEGATIVE
Ketones, ur: NEGATIVE
Leukocytes,Ua: NEGATIVE
Nitrite: NEGATIVE
Protein, ur: NEGATIVE
Specific Gravity, Urine: 1.006 (ref 1.001–1.03)
pH: 5.5 (ref 5.0–8.0)

## 2020-06-02 LAB — LIPID PANEL
Cholesterol: 203 mg/dL — ABNORMAL HIGH (ref <200)
HDL: 41 mg/dL (ref >=40)
LDL Cholesterol (Calc): 143 mg/dL (calc) — ABNORMAL HIGH
Non-HDL Cholesterol (Calc): 162 mg/dL (calc) — ABNORMAL HIGH (ref <130)
Total CHOL/HDL Ratio: 5 (calc) — ABNORMAL HIGH (ref <5.0)
Triglycerides: 84 mg/dL (ref <150)

## 2020-06-02 LAB — TESTOSTERONE: Testosterone: 1093 ng/dL — ABNORMAL HIGH (ref 250–827)

## 2020-06-02 LAB — COMPLETE METABOLIC PANEL WITH GFR
AG Ratio: 1.7 (calc) (ref 1.0–2.5)
ALT: 18 U/L (ref 9–46)
AST: 32 U/L (ref 10–35)
Albumin: 4.4 g/dL (ref 3.6–5.1)
Alkaline phosphatase (APISO): 40 U/L (ref 35–144)
BUN: 9 mg/dL (ref 7–25)
CO2: 31 mmol/L (ref 20–32)
Calcium: 9.3 mg/dL (ref 8.6–10.3)
Chloride: 102 mmol/L (ref 98–110)
Creat: 1.07 mg/dL (ref 0.70–1.33)
GFR, Est African American: 91 mL/min/{1.73_m2} (ref >=60)
GFR, Est Non African American: 79 mL/min/{1.73_m2} (ref >=60)
Globulin: 2.6 g/dL (calc) (ref 1.9–3.7)
Glucose, Bld: 80 mg/dL (ref 65–99)
Potassium: 4.1 mmol/L (ref 3.5–5.3)
Sodium: 140 mmol/L (ref 135–146)
Total Bilirubin: 0.8 mg/dL (ref 0.2–1.2)
Total Protein: 7 g/dL (ref 6.1–8.1)

## 2020-06-02 LAB — INSULIN, RANDOM: Insulin: 4.4 u[IU]/mL

## 2020-06-02 LAB — HEMOGLOBIN A1C
Hgb A1c MFr Bld: 5.5 % of total Hgb (ref <5.7)
Mean Plasma Glucose: 111 mg/dL
eAG (mmol/L): 6.2 mmol/L

## 2020-06-02 LAB — MICROALBUMIN / CREATININE URINE RATIO
Creatinine, Urine: 44 mg/dL (ref 20–320)
Microalb Creat Ratio: 5 mcg/mg creat (ref <30)
Microalb, Ur: 0.2 mg/dL

## 2020-06-02 LAB — PSA: PSA: 0.38 ng/mL (ref <=4.0)

## 2020-06-02 LAB — IRON, TOTAL/TOTAL IRON BINDING CAP
%SAT: 20 % (calc) (ref 20–48)
Iron: 63 ug/dL (ref 50–180)
TIBC: 312 mcg/dL (calc) (ref 250–425)

## 2020-06-02 LAB — VITAMIN D 25 HYDROXY (VIT D DEFICIENCY, FRACTURES): Vit D, 25-Hydroxy: 91 ng/mL (ref 30–100)

## 2020-06-02 LAB — VITAMIN B12: Vitamin B-12: 622 pg/mL (ref 200–1100)

## 2020-06-02 LAB — TSH: TSH: 2.37 mIU/L (ref 0.40–4.50)

## 2020-06-02 LAB — MAGNESIUM: Magnesium: 2.1 mg/dL (ref 1.5–2.5)

## 2020-06-02 MED ORDER — TESTOSTERONE CYPIONATE 200 MG/ML IM SOLN
INTRAMUSCULAR | 2 refills | Status: AC
Start: 2020-06-02 — End: 2021-03-27

## 2020-06-02 NOTE — Progress Notes (Signed)
========================================================== - Test results slightly outside the reference range are not unusual. If there is anything important, I will review this with you,  otherwise it is considered normal test values.  If you have further questions,  please do not hesitate to contact me at the office or via My Chart.  ========================================================== ==========================================================  - Vitamin B12 - Normal  ========================================================== ==========================================================  - PSA - Low - Great  ! ========================================================== ==========================================================  - Testosterone elevated from recent shot ========================================================== ==========================================================  - Total Chol = 203 - elevated  (Ideal or Goal is less than 180)   - and   - LDL Chol = 143 - very elevated  (Ideal or goal is less than 70  !  )  - Elevated Cholesterol increases your risk for                                      Heart Attack, Stroke, Impotence & Vascular Dementia   - So . . . . Marland Kitchen Please increase your Atorvastatin 40 mg from                                                                       1/2 tablet every other day to                          1/2 tablet EVERY day      or      1 whole tablet every other day  ========================================================== ==========================================================  - Also  - Cholesterol is still very important                                              - Recommend  a  stricter low cholesterol diet   - Cholesterol only comes from animal sources  - ie. meat, dairy, egg yolks  - Eat all the vegetables you want.  - Avoid meat, especially red meat - Beef AND Pork .  - Avoid cheese & dairy - milk & ice  cream.     - Cheese is the most concentrated form of trans-fats which                                                      is the worst thing to clog up our arteries.   - Veggie cheese is OK which can be found in the fresh                   produce section at [employer] or [employer] or [employer] ========================================================== ==========================================================  - A1c - Normal Great - No Diabetes  ! ========================================================== ==========================================================  - Vitamin D = 91 - Excellent  ! ========================================================== ==========================================================  - Iron levels are Normal &  OK ========================================================== ==========================================================  - All Else - CBC - Kidneys - Electrolytes - Liver - Magnesium &  Thyroid    - all  Normal / OK ===========================================================  ===========================================================

## 2020-06-03 ENCOUNTER — Other Ambulatory Visit: Payer: Self-pay | Admitting: Internal Medicine

## 2020-06-04 ENCOUNTER — Other Ambulatory Visit: Payer: Self-pay | Admitting: Internal Medicine

## 2020-06-20 ENCOUNTER — Other Ambulatory Visit: Payer: Self-pay | Admitting: Internal Medicine

## 2020-06-20 DIAGNOSIS — I10 Essential (primary) hypertension: Secondary | ICD-10-CM

## 2020-07-01 LAB — COLOGUARD
Cologuard: NEGATIVE
Cologuard: NEGATIVE

## 2020-07-03 ENCOUNTER — Encounter: Payer: Self-pay | Admitting: Orthopaedic Surgery

## 2020-07-03 ENCOUNTER — Other Ambulatory Visit: Payer: Self-pay

## 2020-07-03 ENCOUNTER — Ambulatory Visit (INDEPENDENT_AMBULATORY_CARE_PROVIDER_SITE_OTHER): Payer: Managed Care, Other (non HMO) | Admitting: Orthopaedic Surgery

## 2020-07-03 DIAGNOSIS — M7660 Achilles tendinitis, unspecified leg: Secondary | ICD-10-CM | POA: Diagnosis not present

## 2020-07-03 DIAGNOSIS — M7732 Calcaneal spur, left foot: Secondary | ICD-10-CM

## 2020-07-03 NOTE — Progress Notes (Signed)
Office Visit Note   Patient: Nicholas Crosby           Date of Birth: 05-24-1965           MRN: [id number] Visit Date: 07/03/2020              Requested by: Unk Pinto, [address],  [place] [address] PCP: Unk Pinto, MD   Assessment & Plan: Visit Diagnoses:  1. Heel spur, left   2. Insertional Achilles tendinopathy     Plan: We reviewed previous x-rays that showed calcification of the terminal Achilles tendon at the insertion site.  We discussed gentle stretching using the inserts and recommendations for gradual weight loss.  He can look at riding exercise bike with a low or medium resistance, swimming, elliptical on the flat etc.  Pathophysiology discussed for calcific tendinopathy.  He can return if he has significant increase symptoms.  Follow-Up Instructions: Return if symptoms worsen or fail to improve.   Orders:  No orders of the defined types were placed in this encounter.  No orders of the defined types were placed in this encounter.     Procedures: No procedures performed   Clinical Data: No additional findings.   Subjective: Chief Complaint  Patient presents with  . Left Ankle - Pain    HPI 55 year old male returns with ongoing problems with pain in his left Achilles insertion site.  He had been more active doing a lot of walking in [place] and then had increased pain.  He had taken his gel pads out for his heels and the recently put them back in.  He did take some dexamethasone by his PCP which helped.  He is used ibuprofen off and on. Previous x-ray showed some Achilles insertion calcification at the calcaneus. Review of Systems 14 point update unchanged from 01/29/2020 office visit.   Objective: Vital Signs: There were no vitals taken for this visit.  Physical Exam Constitutional:      Appearance: He is well-developed.  HENT:     Head: Normocephalic and atraumatic.  Eyes:     Pupils: Pupils are  equal, round, and reactive to light.  Neck:     Thyroid: No thyromegaly.     Trachea: No tracheal deviation.  Cardiovascular:     Rate and Rhythm: Normal rate.  Pulmonary:     Effort: Pulmonary effort is normal.     Breath sounds: No wheezing.  Abdominal:     General: Bowel sounds are normal.     Palpations: Abdomen is soft.  Skin:    General: Skin is warm and dry.     Capillary Refill: Capillary refill takes less than 2 seconds.  Neurological:     Mental Status: He is alert and oriented to person, place, and time.  Psychiatric:        Behavior: Behavior normal.        Thought Content: Thought content normal.        Judgment: Judgment normal.     Ortho Exam patient has some tenderness left Achilles tendon insertion the calcaneus.  Good range of motion.  Posterior tib peroneals are normal normal subtalar motion.  No palpable defects or tenderness remote to the calcaneus with palpation over the Achilles tendon.  Pulses are normal.  Specialty Comments:  No specialty comments available.  Imaging: No results found.   PMFS History: Patient Active Problem List   Diagnosis Date Noted  . Heel spur, left 01/29/2020  .  Insertional Achilles tendinopathy 01/29/2020  . Low back pain 04/05/2019  . Prostatism 12/05/2017  . Aortic atherosclerosis ([place]) by CT scan Jan 2018 01/19/2016  . Paroxysmal A-fib ([place]) 10/12/2015  . Medication management 12/03/2013  . Morbid obesity ([place])  08/05/2013  . Abnormal glucose 04/29/2013  . Hypertension   . Hyperlipidemia   . Testosterone deficiency   . Vitamin D deficiency   . Depression, major, in remission [facility])    Past Medical History:  Diagnosis Date  . Depression   . Hyperlipidemia   . Hypertension   . Prostate enlargement     Family History  Problem Relation Age of Onset  . Hypertension Mother   . Hyperlipidemia Mother   . Diabetes Mother   . Cancer Mother        breast  . Heart disease Father   . Hyperlipidemia Father   .  Hypertension Father     Past Surgical History:  Procedure Laterality Date  . COSMETIC SURGERY     liposuction  . CYST REMOVAL NECK    . NO PAST SURGERIES    . VASECTOMY  04/06/2012   Procedure: VASECTOMY;  Surgeon: Alexis Frock, MD;  Location: [facility];  Service: Urology;  Laterality: Bilateral;   Social History   Occupational History  . Not on file  Tobacco Use  . Smoking status: Never Smoker  . Smokeless tobacco: Never Used  Vaping Use  . Vaping Use: Never used  Substance and Sexual Activity  . Alcohol use: Yes    Alcohol/week: 2.0 standard drinks    Types: 2 Cans of beer per week    Comment: social  . Drug use: No  . Sexual activity: Not on file

## 2020-07-05 ENCOUNTER — Other Ambulatory Visit: Payer: Self-pay | Admitting: Internal Medicine

## 2020-07-05 DIAGNOSIS — I10 Essential (primary) hypertension: Secondary | ICD-10-CM

## 2020-07-05 MED ORDER — ATENOLOL 100 MG PO TABS: ORAL_TABLET | ORAL | 1 refills | Status: DC

## 2020-07-06 ENCOUNTER — Encounter: Payer: Self-pay | Admitting: Internal Medicine

## 2020-07-10 ENCOUNTER — Encounter: Payer: Self-pay | Admitting: Internal Medicine

## 2020-07-13 ENCOUNTER — Other Ambulatory Visit: Payer: Self-pay | Admitting: Adult Health

## 2020-07-23 ENCOUNTER — Other Ambulatory Visit: Payer: Self-pay

## 2020-07-23 DIAGNOSIS — N401 Enlarged prostate with lower urinary tract symptoms: Secondary | ICD-10-CM

## 2020-07-23 DIAGNOSIS — I10 Essential (primary) hypertension: Secondary | ICD-10-CM

## 2020-07-23 MED ORDER — DOXAZOSIN MESYLATE 4 MG PO TABS: ORAL_TABLET | ORAL | 0 refills | Status: DC

## 2020-08-14 ENCOUNTER — Other Ambulatory Visit: Payer: Self-pay | Admitting: Internal Medicine

## 2020-08-14 DIAGNOSIS — R350 Frequency of micturition: Secondary | ICD-10-CM

## 2020-08-14 DIAGNOSIS — N401 Enlarged prostate with lower urinary tract symptoms: Secondary | ICD-10-CM

## 2020-08-14 DIAGNOSIS — I10 Essential (primary) hypertension: Secondary | ICD-10-CM

## 2020-08-26 ENCOUNTER — Ambulatory Visit: Payer: Managed Care, Other (non HMO) | Admitting: Internal Medicine

## 2020-08-26 ENCOUNTER — Other Ambulatory Visit: Payer: Self-pay

## 2020-08-26 VITALS — BP 136/84 | HR 54 | Temp 97.7°F | Resp 16 | Ht 71.0 in | Wt 274.0 lb

## 2020-08-26 DIAGNOSIS — I10 Essential (primary) hypertension: Secondary | ICD-10-CM

## 2020-08-26 DIAGNOSIS — Z79899 Other long term (current) drug therapy: Secondary | ICD-10-CM

## 2020-08-26 DIAGNOSIS — R7309 Other abnormal glucose: Secondary | ICD-10-CM

## 2020-08-26 DIAGNOSIS — E782 Mixed hyperlipidemia: Secondary | ICD-10-CM

## 2020-08-26 DIAGNOSIS — E559 Vitamin D deficiency, unspecified: Secondary | ICD-10-CM

## 2020-08-26 NOTE — Progress Notes (Signed)
Future Appointments  Date Time Provider [place]  08/26/2020 11:30 AM Unk Pinto, MD [facility] None  06/09/2021  3:00 PM Unk Pinto, MD [facility] None    History of Present Illness:       This very nice 55 y.o. recently re-married  presents for 3 month follow up with HTN, HLD, Pre-Diabetes and Vitamin D Deficiency.  Patient has just moved to [place].      Patient is treated for HTN & BP has been controlled at home. Today's BP is at goal - 136/84. Patient has had no complaints of any cardiac type chest pain, palpitations, dyspnea / orthopnea / PND, dizziness, claudication, or dependent edema.       Hyperlipidemia is controlled with diet & meds. Patient denies myalgias or other med SE's. Last Lipids were not at goal   Lab Results  Component Value Date   CHOL 203 (H) 06/01/2020   HDL 41 06/01/2020   LDLCALC 143 (H) 06/01/2020   TRIG 84 06/01/2020   CHOLHDL 5.0 (H) 06/01/2020     Also, the patient has history of PreDiabetes and has had no symptoms of reactive hypoglycemia, diabetic polys, paresthesias or visual blurring.  Last A1c was   Lab Results  Component Value Date   HGBA1C 5.5 06/01/2020        Further, the patient also has history of Vitamin D Deficiency and supplements vitamin D without any suspected side-effects. Last vitamin D was  Lab Results  Component Value Date   VD25OH 64 06/01/2020     Current Outpatient Medications on File Prior to Visit  Medication Sig  . amLODipine (NORVASC) 10 MG tablet Take 1 tablet every morning for BP  . aspirin 81 MG tablet Take 81 mg by mouth daily.  Marland Kitchen atorvastatin (LIPITOR) 40 MG tablet Take 1 tablet Daily for Cholestrol  . COVID-19 mRNA vaccine, Pfizer, 30 MCG/0.3ML injection INJECT AS DIRECTED  . diclofenac Sodium (VOLTAREN) 1 % GEL Apply 2 grams 3 to 4 x /day  To inflamed area  . doxazosin (CARDURA) 4 MG tablet Take  1 tablet  at Bedtime  for BP & Prostate  . finasteride (PROSCAR) 5 MG tablet  Take 5 mg by mouth daily.  . hydrocortisone (PROCTOSOL HC) 2.5 % rectal cream Apply rectally 4 x /day as needed  . minoxidil (LONITEN) 10 MG tablet Take 1 to 2 tablets  Daily for BP  . montelukast (SINGULAIR) 10 MG tablet Take 1 tablet daily for Allergies & Asthma  . nitroGLYCERIN (NITRODUR - DOSED IN MG/24 HR) 0.2 mg/hr patch Apply      1 patch        every 24 hours  . olmesartan (BENICAR) 40 MG tablet TAKE 1 TABLET BY MOUTH DAILY FOR BLOOD PRESSURE  . testosterone cypionate (DEPOTESTOSTERONE CYPIONATE) 200 MG/ML injection Inject 2cc IM q 2 weeks and needs 3 cc syringes &  1" 21 G needles  . torsemide (DEMADEX) 20 MG tablet Take 1 tablet every morning for BP & Fluid Retention  . verapamil (CALAN-SR) 240 MG CR tablet Take  1 tablet  Daily  with Food for BP   No current facility-administered medications on file prior to visit.     Allergies  Allergen Reactions  . Zocor [Simvastatin] Other (See Comments)    cpk elev  . Crestor [Rosuvastatin] Palpitations     PMHx:   Past Medical History:  Diagnosis Date  . Depression   . Hyperlipidemia   . Hypertension   . Prostate enlargement  Immunization History  Administered Date(s) Administered  . Hepatitis B, adult 01/29/2008  . Influenza-Unspecified 01/17/2015, 01/16/2017, 08/29/2017, 01/16/2018, 02/12/2019, 01/22/2020  . PFIZER(Purple Top)SARS-COV-2 Vaccination 04/23/2019, 05/14/2019, 04/13/2020  . Tdap 01/09/2007, 02/05/2013, 05/18/2015  . Zoster Recombinat (Shingrix) 06/23/2020     Past Surgical History:  Procedure Laterality Date  . COSMETIC SURGERY     liposuction  . CYST REMOVAL NECK    . NO PAST SURGERIES    . VASECTOMY  04/06/2012   Procedure: VASECTOMY;  Surgeon: Alexis Frock, MD;  Location: [facility];  Service: Urology;  Laterality: Bilateral;    FHx:    Reviewed / unchanged  SHx:    Reviewed / unchanged   Systems Review:  Constitutional: Denies fever, chills, wt changes, headaches,  insomnia, fatigue, night sweats, change in appetite. Eyes: Denies redness, blurred vision, diplopia, discharge, itchy, watery eyes.  ENT: Denies discharge, congestion, post nasal drip, epistaxis, sore throat, earache, hearing loss, dental pain, tinnitus, vertigo, sinus pain, snoring.  CV: Denies chest pain, palpitations, irregular heartbeat, syncope, dyspnea, diaphoresis, orthopnea, PND, claudication or edema. Respiratory: denies cough, dyspnea, DOE, pleurisy, hoarseness, laryngitis, wheezing.  Gastrointestinal: Denies dysphagia, odynophagia, heartburn, reflux, water brash, abdominal pain or cramps, nausea, vomiting, bloating, diarrhea, constipation, hematemesis, melena, hematochezia  or hemorrhoids. Genitourinary: Denies dysuria, frequency, urgency, nocturia, hesitancy, discharge, hematuria or flank pain. Musculoskeletal: Denies arthralgias, myalgias, stiffness, jt. swelling, pain, limping or strain/sprain.  Skin: Denies pruritus, rash, hives, warts, acne, eczema or change in skin lesion(s). Neuro: No weakness, tremor, incoordination, spasms, paresthesia or pain. Psychiatric: Denies confusion, memory loss or sensory loss. Endo: Denies change in weight, skin or hair change.  Heme/Lymph: No excessive bleeding, bruising or enlarged lymph nodes.  Physical Exam  BP 136/84   Pulse (!) 54   Temp 97.7 F (36.5 C)   Resp 16   Ht 5\' 11"  (1.803 m)   Wt 274 lb (124.3 kg)   SpO2 98%   BMI 38.22 kg/m   Appears  well nourished, well groomed  and in no distress.  Eyes: PERRLA, EOMs, conjunctiva no swelling or erythema. Sinuses: No frontal/maxillary tenderness ENT/Mouth: EAC's clear, TM's nl w/o erythema, bulging. Nares clear w/o erythema, swelling, exudates. Oropharynx clear without erythema or exudates. Oral hygiene is good. Tongue normal, non obstructing. Hearing intact.  Neck: Supple. Thyroid not palpable. Car 2+/2+ without bruits, nodes or JVD. Chest: Respirations nl with BS clear & equal w/o  rales, rhonchi, wheezing or stridor.  Cor: Heart sounds normal w/ regular rate and rhythm without sig. murmurs, gallops, clicks or rubs. Peripheral pulses normal and equal  without edema.  Abdomen: Soft & bowel sounds normal. Non-tender w/o guarding, rebound, hernias, masses or organomegaly.  Lymphatics: Unremarkable.  Musculoskeletal: Full ROM all peripheral extremities, joint stability, 5/5 strength and normal gait.  Skin: Warm, dry without exposed rashes, lesions or ecchymosis apparent.  Neuro: Cranial nerves intact, reflexes equal bilaterally. Sensory-motor testing grossly intact. Tendon reflexes grossly intact.  Pysch: Alert & oriented x 3.  Insight and judgement nl & appropriate. No ideations.  Assessment and Plan:  - Continue medication, monitor blood pressure at home.  - Continue DASH diet.  Reminder to go to the ER if any CP,  SOB, nausea, dizziness, severe HA, changes vision/speech.  - Continue diet/meds, exercise,& lifestyle modifications.  - Continue monitor periodic cholesterol/liver & renal functions    - Continue diet, exercise  - Lifestyle modifications.  - Monitor appropriate labs. - Continue supplementation.        Discussed  regular exercise, BP monitoring, weight control to achieve/maintain BMI less than 25 and discussed med and SE's. Recommended labs to assess and monitor clinical status with further disposition pending results of labs.  I discussed the assessment and treatment plan with the patient. The patient was provided an opportunity to ask questions and all were answered. The patient agreed with the plan and demonstrated an understanding of the instructions.  I provided over 30 minutes of exam, counseling, chart review and  complex critical decision making.        The patient was advised to call back or seek an in-person evaluation if the symptoms worsen or if the condition fails to improve as anticipated.   Kirtland Bouchard, MD

## 2020-08-26 NOTE — Patient Instructions (Signed)

## 2020-08-27 LAB — CBC WITH DIFFERENTIAL/PLATELET
Absolute Monocytes: 475 cells/uL (ref 200–950)
Basophils Absolute: 49 cells/uL (ref 0–200)
Basophils Relative: 0.9 %
Eosinophils Absolute: 81 cells/uL (ref 15–500)
Eosinophils Relative: 1.5 %
HCT: 41.3 % (ref 38.5–50.0)
Hemoglobin: 13.8 g/dL (ref 13.2–17.1)
Lymphs Abs: 1382 cells/uL (ref 850–3900)
MCH: 27.5 pg (ref 27.0–33.0)
MCHC: 33.4 g/dL (ref 32.0–36.0)
MCV: 82.4 fL (ref 80.0–100.0)
MPV: 11.2 fL (ref 7.5–12.5)
Monocytes Relative: 8.8 %
Neutro Abs: 3413 cells/uL (ref 1500–7800)
Neutrophils Relative %: 63.2 %
Platelets: 238 10*3/uL (ref 140–400)
RBC: 5.01 10*6/uL (ref 4.20–5.80)
RDW: 13.3 % (ref 11.0–15.0)
Total Lymphocyte: 25.6 %
WBC: 5.4 10*3/uL (ref 3.8–10.8)

## 2020-08-27 LAB — COMPLETE METABOLIC PANEL WITH GFR
AG Ratio: 1.4 (calc) (ref 1.0–2.5)
ALT: 33 U/L (ref 9–46)
AST: 46 U/L — ABNORMAL HIGH (ref 10–35)
Albumin: 4.2 g/dL (ref 3.6–5.1)
Alkaline phosphatase (APISO): 38 U/L (ref 35–144)
BUN: 19 mg/dL (ref 7–25)
CO2: 33 mmol/L — ABNORMAL HIGH (ref 20–32)
Calcium: 9.4 mg/dL (ref 8.6–10.3)
Chloride: 102 mmol/L (ref 98–110)
Creat: 1.18 mg/dL (ref 0.70–1.33)
GFR, Est African American: 81 mL/min/{1.73_m2} (ref >=60)
GFR, Est Non African American: 70 mL/min/{1.73_m2} (ref >=60)
Globulin: 2.9 g/dL (calc) (ref 1.9–3.7)
Glucose, Bld: 94 mg/dL (ref 65–99)
Potassium: 4.4 mmol/L (ref 3.5–5.3)
Sodium: 141 mmol/L (ref 135–146)
Total Bilirubin: 0.7 mg/dL (ref 0.2–1.2)
Total Protein: 7.1 g/dL (ref 6.1–8.1)

## 2020-08-27 LAB — TSH: TSH: 2.3 mIU/L (ref 0.40–4.50)

## 2020-08-27 LAB — MAGNESIUM: Magnesium: 2.2 mg/dL (ref 1.5–2.5)

## 2020-08-27 NOTE — Progress Notes (Signed)
============================================================ -   Test results slightly outside the reference range are not unusual. If there is anything important, I will review this with you,  otherwise it is considered normal test values.  If you have further questions,  please do not hesitate to contact me at the office or via My Chart.  ============================================================ ============================================================  -  All Else - CBC - Kidneys - Electrolytes - Liver - Magnesium & Thyroid    - all  Normal / OK ============================================================ ============================================================

## 2020-08-28 ENCOUNTER — Other Ambulatory Visit: Payer: Self-pay | Admitting: Internal Medicine

## 2020-08-28 MED ORDER — DEXAMETHASONE 4 MG PO TABS: ORAL_TABLET | ORAL | 2 refills | Status: AC

## 2020-08-29 ENCOUNTER — Encounter: Payer: Self-pay | Admitting: Internal Medicine

## 2020-08-29 ENCOUNTER — Other Ambulatory Visit: Payer: Self-pay | Admitting: Internal Medicine

## 2020-08-29 DIAGNOSIS — I10 Essential (primary) hypertension: Secondary | ICD-10-CM

## 2020-09-10 ENCOUNTER — Other Ambulatory Visit: Payer: Self-pay | Admitting: Internal Medicine

## 2020-09-10 DIAGNOSIS — I10 Essential (primary) hypertension: Secondary | ICD-10-CM

## 2020-09-10 DIAGNOSIS — R0989 Other specified symptoms and signs involving the circulatory and respiratory systems: Secondary | ICD-10-CM

## 2020-10-23 ENCOUNTER — Other Ambulatory Visit: Payer: Self-pay | Admitting: Internal Medicine

## 2020-10-23 DIAGNOSIS — I10 Essential (primary) hypertension: Secondary | ICD-10-CM

## 2020-11-11 ENCOUNTER — Other Ambulatory Visit: Payer: Self-pay | Admitting: Internal Medicine

## 2020-11-11 DIAGNOSIS — R0989 Other specified symptoms and signs involving the circulatory and respiratory systems: Secondary | ICD-10-CM

## 2020-11-22 ENCOUNTER — Other Ambulatory Visit: Payer: Self-pay | Admitting: Internal Medicine

## 2020-11-22 DIAGNOSIS — I10 Essential (primary) hypertension: Secondary | ICD-10-CM

## 2020-11-23 ENCOUNTER — Telehealth: Payer: Self-pay | Admitting: Orthopaedic Surgery

## 2020-11-23 NOTE — Telephone Encounter (Signed)
Received call from pt checking status of request for records from [employer]. IC, advised pt, the request was faxed to [facility], not to our office so we did not receive it to process it. Advised him I would fax first thing this morning. Records faxed

## 2021-05-27 ENCOUNTER — Encounter: Payer: Self-pay | Admitting: Internal Medicine

## 2021-06-09 ENCOUNTER — Encounter: Payer: Managed Care, Other (non HMO) | Admitting: Internal Medicine

## 2021-10-14 ENCOUNTER — Encounter: Payer: Self-pay | Admitting: Internal Medicine

## 2022-02-03 ENCOUNTER — Encounter: Payer: Self-pay | Admitting: Internal Medicine
